# Patient Record
Sex: Female | Born: 1970
Health system: Southern US, Community
[De-identification: ages and names within clinical notes are randomized; demographics above are authoritative.]

## PROBLEM LIST (undated history)

## (undated) DIAGNOSIS — K219 Gastro-esophageal reflux disease without esophagitis: Secondary | ICD-10-CM

---

## 2005-07-29 ENCOUNTER — Ambulatory Visit: Payer: Self-pay | Admitting: Internal Medicine

## 2009-02-23 ENCOUNTER — Ambulatory Visit: Payer: Self-pay | Admitting: Internal Medicine

## 2011-03-25 ENCOUNTER — Ambulatory Visit: Payer: Self-pay | Admitting: Internal Medicine

## 2011-04-05 ENCOUNTER — Ambulatory Visit: Payer: Self-pay | Admitting: Internal Medicine

## 2011-05-22 ENCOUNTER — Ambulatory Visit: Payer: Self-pay | Admitting: Internal Medicine

## 2011-06-06 ENCOUNTER — Ambulatory Visit: Payer: Self-pay | Admitting: Internal Medicine

## 2011-07-04 ENCOUNTER — Ambulatory Visit: Payer: Self-pay | Admitting: Internal Medicine

## 2011-08-04 ENCOUNTER — Ambulatory Visit: Payer: Self-pay | Admitting: Internal Medicine

## 2012-03-10 ENCOUNTER — Other Ambulatory Visit (HOSPITAL_COMMUNITY)
Admission: RE | Admit: 2012-03-10 | Discharge: 2012-03-10 | Disposition: A | Discharge status: Home / Self Care | Payer: BC Managed Care – PPO | Source: Ambulatory Visit | Attending: Internal Medicine | Admitting: Internal Medicine

## 2012-03-10 ENCOUNTER — Ambulatory Visit (INDEPENDENT_AMBULATORY_CARE_PROVIDER_SITE_OTHER): Payer: BC Managed Care – PPO | Admitting: Internal Medicine

## 2012-03-10 ENCOUNTER — Encounter: Payer: Self-pay | Admitting: Internal Medicine

## 2012-03-10 VITALS — BP 122/78 | HR 105 | Temp 97.0°F | Resp 20 | Ht 67.0 in | Wt 286.0 lb

## 2012-03-10 DIAGNOSIS — R5383 Other fatigue: Secondary | ICD-10-CM

## 2012-03-10 DIAGNOSIS — R5381 Other malaise: Secondary | ICD-10-CM

## 2012-03-10 DIAGNOSIS — Z139 Encounter for screening, unspecified: Secondary | ICD-10-CM

## 2012-03-10 DIAGNOSIS — Z1151 Encounter for screening for human papillomavirus (HPV): Secondary | ICD-10-CM | POA: Insufficient documentation

## 2012-03-10 DIAGNOSIS — Z01419 Encounter for gynecological examination (general) (routine) without abnormal findings: Secondary | ICD-10-CM | POA: Insufficient documentation

## 2012-03-10 LAB — CBC WITH DIFFERENTIAL/PLATELET
Basophils Relative: 0.4 % (ref 0.0–3.0)
Eosinophils Absolute: 0.1 10*3/uL (ref 0.0–0.7)
Hemoglobin: 14 g/dL (ref 12.0–15.0)
Lymphocytes Relative: 26.8 % (ref 12.0–46.0)
MCHC: 32.9 g/dL (ref 30.0–36.0)
Monocytes Relative: 6.6 % (ref 3.0–12.0)
Neutro Abs: 5.3 10*3/uL (ref 1.4–7.7)
Neutrophils Relative %: 64.6 % (ref 43.0–77.0)
RBC: 4.93 Mil/uL (ref 3.87–5.11)
WBC: 8.2 10*3/uL (ref 4.5–10.5)

## 2012-03-10 LAB — LIPID PANEL
HDL: 32.4 mg/dL — ABNORMAL LOW (ref >39.00)
Triglycerides: 169 mg/dL — ABNORMAL HIGH (ref 0.0–149.0)
VLDL: 33.8 mg/dL (ref 0.0–40.0)

## 2012-03-10 LAB — COMPREHENSIVE METABOLIC PANEL
ALT: 28 U/L (ref 0–35)
AST: 23 U/L (ref 0–37)
Albumin: 4 g/dL (ref 3.5–5.2)
BUN: 14 mg/dL (ref 6–23)
CO2: 22 mEq/L (ref 19–32)
Calcium: 9.2 mg/dL (ref 8.4–10.5)
Chloride: 105 mEq/L (ref 96–112)
Creatinine, Ser: 0.8 mg/dL (ref 0.4–1.2)
GFR: 80.71 mL/min (ref >60.00)
Potassium: 4 mEq/L (ref 3.5–5.1)

## 2012-03-10 NOTE — Progress Notes (Signed)
  Subjective:    Patient ID: Gwendolyn Torres, female    DOB: [birth year], 41 y.o.   MRN: [id number]  HPI 41 year old female who comes in today for her complete physical exam.  She reports some increased stress.  Has noticed increased mood swings and is more emotional.  Still having regular periods.  Some increased work stress.  Has been a little heavier.  LMP 10/27.  Husband has had a vasectomy.  Discussed the increased stress and mood swings in more detail - with her.  She desires no further intervention at this time.  May see a counselor.  No chest pain or tightness.  No increased sob.     Review of Systems Patient denies any headache, lightheadedness or dizziness.  No sinus or allergy symptoms.  No chest pain, tightness or palpitations.  No increased shortness of breath, cough or congestion.  No nausea or vomiting.  No abdominal pain or cramping.  No bowel change, such as diarrhea, constipation, BRBPR or melana.  No urine change.        Objective:   Physical Exam Filed Vitals:   03/10/12 0825  BP: 122/78  Pulse: 105  Temp: 97 F (36.1 C)  Resp: 69   41 year old female in no acute distress.   HEENT:  Nares- clear.  Oropharynx - without lesions. NECK:  Supple.  Nontender.  No audible bruit.  HEART:  Appears to be regular. LUNGS:  No crackles or wheezing audible.  Respirations even and unlabored.  RADIAL PULSE:  Equal bilaterally.    BREASTS:  No nipple discharge or nipple retraction present.  Could not appreciate any distinct nodules or axillary adenopathy.  ABDOMEN:  Soft, nontender.  Bowel sounds present and normal.  No audible abdominal bruit.  GU:  Normal external genitalia.  Vaginal vault without lesions.  Cervix identified.  Pap performed. Could not appreciate any adnexal masses or tenderness.   EXTREMITIES:  No increased edema present.  DP pulses palpable and equal bilaterally.           Assessment & Plan:  FAMILY HISTORY OF ANEURYSMS.  Have discussed with her regarding screening  ultrasound.  Will notify me when agreeable.    FATIGUE.  May be multifactorial.  Increased stress.  Will check cbc, met c and tsh.    INCREASED PSYCHOSOCIAL STRESSORS.  Desires no further intervention at this point.  Discussed medication and counseling.  Will follow closely.  She will notify me if she feels she needs anything more.  I spent over 25 minutes with this pt and more than 50% of the time was spent in counseling.    HEALTH MAINTENANCE.  Physical today.  Schedule routine labs and mammograms.  Check cholesterol.    HEALTH MAINTENANCE.  Physical today.  Mammogram 02/23/09 - BiRADS II.  Check routine labs and cholesterol.

## 2012-03-10 NOTE — Patient Instructions (Addendum)
It was nice seeing you today.  Let me know if there is anything I can do or if you need anything.  We will notify you of your lab results once they are available.

## 2012-03-12 ENCOUNTER — Telehealth: Payer: Self-pay | Admitting: Internal Medicine

## 2012-03-12 NOTE — Telephone Encounter (Signed)
Notified pt of lab results through My Chart

## 2012-03-15 NOTE — Progress Notes (Signed)
Called patient with results.  

## 2012-03-24 ENCOUNTER — Ambulatory Visit: Payer: Self-pay | Admitting: Internal Medicine

## 2012-03-27 ENCOUNTER — Telehealth: Payer: Self-pay | Admitting: Internal Medicine

## 2012-03-27 ENCOUNTER — Encounter: Payer: Self-pay | Admitting: Internal Medicine

## 2012-03-27 NOTE — Telephone Encounter (Signed)
Pt notified of normal mammo via My Chart

## 2012-04-05 ENCOUNTER — Encounter: Payer: Self-pay | Admitting: Internal Medicine

## 2012-04-10 ENCOUNTER — Encounter: Payer: Self-pay | Admitting: Internal Medicine

## 2012-04-12 ENCOUNTER — Telehealth: Payer: Self-pay | Admitting: Internal Medicine

## 2012-04-12 NOTE — Telephone Encounter (Signed)
I reviewed Gwendolyn Torres's my chart message. I would like for her to schedule an appt to discuss starting the Wellbutrin.   Please schedule her an appt.  Let me know if is a problem.  Thanks.

## 2012-04-13 NOTE — Telephone Encounter (Signed)
Left message for pt to call and schedule appt 

## 2012-04-16 ENCOUNTER — Encounter: Payer: Self-pay | Admitting: Internal Medicine

## 2012-04-16 ENCOUNTER — Ambulatory Visit (INDEPENDENT_AMBULATORY_CARE_PROVIDER_SITE_OTHER): Payer: BC Managed Care – PPO | Admitting: Internal Medicine

## 2012-04-16 VITALS — BP 130/80 | HR 83 | Temp 98.3°F | Ht 67.0 in | Wt 289.5 lb

## 2012-04-16 DIAGNOSIS — Z566 Other physical and mental strain related to work: Secondary | ICD-10-CM

## 2012-04-16 DIAGNOSIS — R5383 Other fatigue: Secondary | ICD-10-CM

## 2012-04-16 DIAGNOSIS — R5381 Other malaise: Secondary | ICD-10-CM

## 2012-04-16 MED ORDER — BUPROPION HCL ER (XL) 150 MG PO TB24: 150.0000 mg | ORAL_TABLET | Freq: Every day | ORAL | Status: DC

## 2012-04-18 ENCOUNTER — Encounter: Payer: Self-pay | Admitting: Internal Medicine

## 2012-04-18 NOTE — Progress Notes (Signed)
  Subjective:    Patient ID: Gwendolyn Torres, female    DOB: 1970-09-16, 41 y.o.   MRN: [id number]  HPI 41 year old female who comes in today with concerns regarding increased stress.  More emotional.  Seeing a counselor Barbaraann Cao).  Increased stress with work.  Sleeping ok.  Eating and drinking well.  No bowel issues.  Desires to start medication.  No suicidal ideations.   Review of Systems Patient denies any headache, lightheadedness or dizziness.  No significant sinus or allergy symptoms.  No chest pain, tightness or palpitations.  No increased shortness of breath, cough or congestion.  No nausea or vomiting.  No abdominal pain or cramping.  No bowel change, such as diarrhea, constipation, BRBPR or melana.  No urine change.        Objective:   Physical Exam Filed Vitals:   04/16/12 0912  BP: 130/80  Pulse: 83  Temp: 98.3 F (52.39 C)   41 year old female in no acute distress.  NECK:  Supple, nontender.   HEART:  Appears to be regular. LUNGS:  Without crackles or wheezing audible.  Respirations even and unlabored.   RADIAL PULSE:  Equal bilaterally.  ABDOMEN:  Soft, nontender.                  Assessment & Plan:  INCREASED PSYCHOSOCIAL STRESSORS.  Discussed at length with her today.  No suicidal ideations.  Seeing Dr Oscar La.  Will start Wellbutrin XL 150mg  q day.  Follow closely.  Notify me if problems.  Get her back in soon to reassess.    HEALTH MAINTENANCE.  Physical  03/10/12.  Pap at physical wnl.  Mammogram 03/24/12 - Birads II.

## 2012-06-07 ENCOUNTER — Telehealth: Payer: Self-pay | Admitting: Internal Medicine

## 2012-06-07 NOTE — Telephone Encounter (Signed)
I am going to have to have some help with this.  I have no idea why the insurance did not cover - unless it has something to do with her particular insurance plan.  I have not had an issue with this previously.  Help! Thanks.

## 2012-06-07 NOTE — Telephone Encounter (Signed)
Patient called in today to discuss her bill from Dec. 13, 13 she states [employer] won't pay for the dx code that was billed that day. Is there another dx code we can add to get this bill changed. I have also sent Anabell a message asking her to look into the bill for me.  She also states she is suppose to come in on Wednesday she thinks this week to discuss how the new medication is doing that she was put on in Dec.; however she can't afford another $120. Bill if the dx code isn't going to be covered.

## 2012-06-09 ENCOUNTER — Encounter: Payer: Self-pay | Admitting: Internal Medicine

## 2012-06-09 DIAGNOSIS — M1710 Unilateral primary osteoarthritis, unspecified knee: Secondary | ICD-10-CM

## 2012-06-09 DIAGNOSIS — M542 Cervicalgia: Secondary | ICD-10-CM

## 2012-06-09 DIAGNOSIS — IMO0002 Reserved for concepts with insufficient information to code with codable children: Secondary | ICD-10-CM | POA: Insufficient documentation

## 2012-06-09 DIAGNOSIS — F419 Anxiety disorder, unspecified: Secondary | ICD-10-CM | POA: Insufficient documentation

## 2012-06-09 DIAGNOSIS — F418 Other specified anxiety disorders: Secondary | ICD-10-CM | POA: Insufficient documentation

## 2012-06-09 NOTE — Telephone Encounter (Signed)
I spoke with Dr. Lorin Picket, and we are going to send a request to charge correction to change dx code to 780.79. I have left message for patient to return my call.

## 2012-06-09 NOTE — Telephone Encounter (Signed)
I have also sent e-mail to Annabell to see if she can change dx code.  I have also advised patient to call insurance company to see if we are in network.

## 2012-06-10 ENCOUNTER — Ambulatory Visit (INDEPENDENT_AMBULATORY_CARE_PROVIDER_SITE_OTHER): Payer: BC Managed Care – PPO | Admitting: Internal Medicine

## 2012-06-10 ENCOUNTER — Encounter: Payer: Self-pay | Admitting: Internal Medicine

## 2012-06-10 VITALS — BP 120/74 | HR 97 | Temp 98.8°F | Ht 67.0 in | Wt 289.5 lb

## 2012-06-10 DIAGNOSIS — F411 Generalized anxiety disorder: Secondary | ICD-10-CM

## 2012-06-10 DIAGNOSIS — F418 Other specified anxiety disorders: Secondary | ICD-10-CM

## 2012-06-10 MED ORDER — CLOTRIMAZOLE-BETAMETHASONE 1-0.05 % EX CREA: TOPICAL_CREAM | Freq: Two times a day (BID) | CUTANEOUS | Status: DC

## 2012-06-11 ENCOUNTER — Encounter: Payer: Self-pay | Admitting: Internal Medicine

## 2012-06-11 ENCOUNTER — Telehealth: Payer: Self-pay | Admitting: Internal Medicine

## 2012-06-11 NOTE — Assessment & Plan Note (Signed)
Started on wellbutrin last visit.  See above.  Doing better.  Follow.

## 2012-06-11 NOTE — Telephone Encounter (Signed)
My chart message sent to pt.

## 2012-06-11 NOTE — Progress Notes (Signed)
  Subjective:    Patient ID: Gwendolyn Torres, female    DOB: [birth year], 42 y.o.   MRN: [id number]  HPI 42 year old female who comes in today for a scheduled follow up.  Was having increased stress.  See last note for details.  Was started on Wellbutrin.  Feels better.  Tolerating.  Feels more calm.  Has started exercising.  Not as emotional.  Overall feels much better.  Feels this dose is working well. For her.    Review of Systems Patient denies any headache, lightheadedness or dizziness.  No significant sinus or allergy symptoms.  No chest pain, tightness or palpitations.  No increased shortness of breath, cough or congestion.  No nausea or vomiting.  No abdominal pain or cramping.  No bowel change, such as diarrhea, constipation, BRBPR or melana.  No urine change.  She does report noticing a few skin lesions.  One on her arm.  One on her leg and one on her abdomen.  No itching.  Circular.  No new contacts or exposures.        Objective:   Physical Exam  Filed Vitals:   06/10/12 0908  BP: 120/74  Pulse: 97  Temp: 98.8 F (37.28 C)   42 year old female in no acute distress.  NECK:  Supple, nontender.   HEART:  Appears to be regular. LUNGS:  Without crackles or wheezing audible.  Respirations even and unlabored.   RADIAL PULSE:  Equal bilaterally.  ABDOMEN:  Soft, nontender.              EXTREMITIES:  No increased edema.   SKIN:  Small circular (drying) lesion left forearm.  Small circular lesions - one on her left lower leg and one on her abdomen.  No increased erythema or warmth.     Assessment & Plan:  INCREASED PSYCHOSOCIAL STRESSORS.  Started on Wellbutrin XL 150mg  q day last visit.  Doing better.  Follow closely.  Notify me if problems.   DERMATOLOGY.  Skin lesions as outlined.  Unclear as to the exact etiology.  Will treat with lotrisone cream as directed.  Notify me if persistent.    HEALTH MAINTENANCE.  Physical  03/10/12.  Pap at physical wnl.  Mammogram 03/24/12 - Birads II.

## 2012-06-19 ENCOUNTER — Other Ambulatory Visit: Payer: Self-pay

## 2012-06-20 ENCOUNTER — Telehealth: Payer: Self-pay | Admitting: Internal Medicine

## 2012-06-20 MED ORDER — BUPROPION HCL ER (XL) 150 MG PO TB24: 150.0000 mg | ORAL_TABLET | Freq: Every day | ORAL | Status: DC

## 2012-06-20 NOTE — Telephone Encounter (Signed)
Refilled wellbutrin XL 150mg  q day #90 with one refill

## 2012-10-15 ENCOUNTER — Encounter: Payer: Self-pay | Admitting: Internal Medicine

## 2012-10-15 ENCOUNTER — Ambulatory Visit (INDEPENDENT_AMBULATORY_CARE_PROVIDER_SITE_OTHER): Payer: BC Managed Care – PPO | Admitting: Internal Medicine

## 2012-10-15 VITALS — BP 120/70 | HR 76 | Temp 98.3°F | Ht 67.0 in | Wt 252.8 lb

## 2012-10-15 DIAGNOSIS — F418 Other specified anxiety disorders: Secondary | ICD-10-CM

## 2012-10-15 DIAGNOSIS — F411 Generalized anxiety disorder: Secondary | ICD-10-CM

## 2012-10-17 ENCOUNTER — Encounter: Payer: Self-pay | Admitting: Internal Medicine

## 2012-10-17 NOTE — Assessment & Plan Note (Signed)
On Wellbutrin.  Doing well.  Follow.

## 2012-10-17 NOTE — Progress Notes (Signed)
  Subjective:    Patient ID: Gwendolyn Torres, female    DOB: [birth year], 42 y.o.   MRN: [id number]  HPI 42 year old female who comes in today for a scheduled follow up.  Was having increased stress.  See previous notes for details.  Was started on Wellbutrin.  Feels better.  Tolerating.  Feels more calm.  Has been exercising.  Joined [employer].  Exercises regularly.   Overall feels much better.  Feels this dose is working well for her.  Has lost weight.     Current Outpatient Prescriptions on File Prior to Visit  Medication Sig Dispense Refill  . buPROPion (WELLBUTRIN XL) 150 MG 24 hr tablet Take 1 tablet (150 mg total) by mouth daily.  90 tablet  1  . ibuprofen (ADVIL,MOTRIN) 200 MG tablet Take 200 mg by mouth.      . clotrimazole-betamethasone (LOTRISONE) cream Apply topically 2 (two) times daily. For 7-10 days.  30 g  0   No current facility-administered medications on file prior to visit.    Review of Systems Patient denies any headache, lightheadedness or dizziness.  No significant sinus or allergy symptoms.  No chest pain, tightness or palpitations.  No increased shortness of breath, cough or congestion.  No nausea or vomiting.  No abdominal pain or cramping.  No bowel change, such as diarrhea, constipation, BRBPR or melana.  No urine change.  Exercising.  Watching what she eats.  Has lost weight.  Feels better.         Objective:   Physical Exam  Filed Vitals:   10/15/12 0802  BP: 120/70  Pulse: 76  Temp: 98.3 F (50.63 C)   42 year old female in no acute distress. HEENT:  Nares clear.  Oropharynx - without lesions.   NECK:  Supple, nontender.   HEART:  Appears to be regular. LUNGS:  Without crackles or wheezing audible.  Respirations even and unlabored.   RADIAL PULSE:  Equal bilaterally.  ABDOMEN:  Soft, nontender.             EXTREMITIES:  No increased edema.      Assessment & Plan:  INCREASED PSYCHOSOCIAL STRESSORS.  Doing well on Wellbutrin XL 150mg  q day.   Follow.  WEIGHT LOSS.  Has joined [employer] and is doing [employer].  Has lost weight.  Feels better.  Follow.    HEALTH MAINTENANCE.  Physical  03/10/12.  Pap at physical wnl.  Mammogram 03/24/12 - [facility]ads II.

## 2012-12-22 ENCOUNTER — Other Ambulatory Visit: Payer: Self-pay | Admitting: *Deleted

## 2012-12-22 MED ORDER — BUPROPION HCL ER (XL) 150 MG PO TB24: 150.0000 mg | ORAL_TABLET | Freq: Every day | ORAL | Status: DC

## 2013-03-10 ENCOUNTER — Other Ambulatory Visit: Payer: Self-pay

## 2013-03-14 ENCOUNTER — Encounter: Payer: Self-pay | Admitting: Internal Medicine

## 2013-03-14 ENCOUNTER — Other Ambulatory Visit: Payer: Self-pay | Admitting: *Deleted

## 2013-03-14 ENCOUNTER — Ambulatory Visit (INDEPENDENT_AMBULATORY_CARE_PROVIDER_SITE_OTHER): Payer: BC Managed Care – PPO | Admitting: Internal Medicine

## 2013-03-14 VITALS — BP 110/80 | HR 74 | Temp 98.3°F | Ht 67.0 in | Wt 233.0 lb

## 2013-03-14 DIAGNOSIS — F439 Reaction to severe stress, unspecified: Secondary | ICD-10-CM

## 2013-03-14 DIAGNOSIS — F411 Generalized anxiety disorder: Secondary | ICD-10-CM

## 2013-03-14 DIAGNOSIS — Z733 Stress, not elsewhere classified: Secondary | ICD-10-CM

## 2013-03-14 DIAGNOSIS — Z13220 Encounter for screening for lipoid disorders: Secondary | ICD-10-CM

## 2013-03-14 DIAGNOSIS — F418 Other specified anxiety disorders: Secondary | ICD-10-CM

## 2013-03-14 LAB — CBC WITH DIFFERENTIAL/PLATELET
Basophils Absolute: 0 10*3/uL (ref 0.0–0.1)
Eosinophils Absolute: 0.2 10*3/uL (ref 0.0–0.7)
Eosinophils Relative: 2.4 % (ref 0.0–5.0)
MCHC: 34.1 g/dL (ref 30.0–36.0)
MCV: 86.6 fl (ref 78.0–100.0)
Monocytes Absolute: 0.9 10*3/uL (ref 0.1–1.0)
Neutrophils Relative %: 56 % (ref 43.0–77.0)
Platelets: 300 10*3/uL (ref 150.0–400.0)
WBC: 7.1 10*3/uL (ref 4.5–10.5)

## 2013-03-14 LAB — LIPID PANEL
Cholesterol: 128 mg/dL (ref 0–200)
HDL: 46.4 mg/dL (ref >39.00)
LDL Cholesterol: 70 mg/dL (ref 0–99)
Total CHOL/HDL Ratio: 3
Triglycerides: 57 mg/dL (ref 0.0–149.0)

## 2013-03-14 LAB — COMPREHENSIVE METABOLIC PANEL
ALT: 23 U/L (ref 0–35)
AST: 20 U/L (ref 0–37)
Albumin: 3.9 g/dL (ref 3.5–5.2)
Alkaline Phosphatase: 60 U/L (ref 39–117)
Potassium: 4.8 mEq/L (ref 3.5–5.1)
Sodium: 138 mEq/L (ref 135–145)
Total Protein: 7.5 g/dL (ref 6.0–8.3)

## 2013-03-14 MED ORDER — BUPROPION HCL ER (XL) 300 MG PO TB24: 300.0000 mg | ORAL_TABLET | Freq: Every day | ORAL | Status: DC

## 2013-03-14 MED ORDER — FLUTICASONE PROPIONATE 50 MCG/ACT NA SUSP: 2.0000 | Freq: Every day | NASAL | Status: DC

## 2013-03-14 NOTE — Progress Notes (Signed)
  Subjective:    Patient ID: Gwendolyn Torres, female    DOB: Dec 27, 1970, 42 y.o.   MRN: [id number]  HPI 42 year old female who comes in today for her complete physical exam.   Was having increased stress.  See previous notes for details.  Was started on Wellbutrin.   Feels better.  Tolerating.  Feels more calm.  Has been exercising.  Joined [employer].  Exercises regularly.   Overall feels much better.   Has lost weight.  Feels the wellbutrin has been working well for her, but has noticed some increased stress recently.  Feels may need to increase the dose.   Periods regular.  LMP 02/21/13.     Current Outpatient Prescriptions on File Prior to Visit  Medication Sig Dispense Refill  . ibuprofen (ADVIL,MOTRIN) 200 MG tablet Take 200 mg by mouth as needed for headache.        No current facility-administered medications on file prior to visit.    Review of Systems Patient denies any headache, lightheadedness or dizziness.  No significant sinus or allergy symptoms.  No chest pain, tightness or palpitations.  No increased shortness of breath, cough or congestion.  No nausea or vomiting.  No abdominal pain or cramping.  No bowel change, such as diarrhea, constipation, BRBPR or melana.  No urine change.  Exercising.  Watching what she eats.  Has lost weight.  Feels better.  Increased stress as outlined.  See above.        Objective:   Physical Exam  Filed Vitals:   03/14/13 0823  BP: 110/80  Pulse: 74  Temp: 98.3 F (36.8 C)   Blood pressure recheck:  112/78, pulse 10  42 year old female in no acute distress.   HEENT:  Nares- clear.  Oropharynx - without lesions. NECK:  Supple.  Nontender.  No audible bruit.  HEART:  Appears to be regular. LUNGS:  No crackles or wheezing audible.  Respirations even and unlabored.  RADIAL PULSE:  Equal bilaterally.    BREASTS:  No nipple discharge or nipple retraction present.  Could not appreciate any distinct nodules or axillary adenopathy.  ABDOMEN:   Soft, nontender.  Bowel sounds present and normal.  No audible abdominal bruit.  GU:  Not performed.     EXTREMITIES:  No increased edema present.  DP pulses palpable and equal bilaterally.          Assessment & Plan:  WEIGHT LOSS.  Has joined [employer] and is doing [employer].  Has lost weight.  Feels better.  Follow.    HEALTH MAINTENANCE.  Physical  today.  Pap at physical 11/13 - negative with negative HPV.   Mammogram 03/24/12 - Birads II.  Gave her the information to schedule a f/u mammogram.    I spent 25 minutes with the patient and more than 50% of the time was spent in consultation regarding the above.

## 2013-03-14 NOTE — Assessment & Plan Note (Signed)
On Wellbutrin.  Increased stress.  Feels may need to increase the dose.  Will increase wellbutrin to 300mg  q day.  Follow.

## 2013-03-14 NOTE — Progress Notes (Signed)
Pre-visit discussion using our clinic review tool. No additional management support is needed unless otherwise documented below in the visit note.  

## 2013-03-15 ENCOUNTER — Encounter: Payer: Self-pay | Admitting: Internal Medicine

## 2013-05-03 ENCOUNTER — Ambulatory Visit: Payer: Self-pay | Admitting: Internal Medicine

## 2013-05-03 LAB — HM MAMMOGRAPHY: HM MAMMO: NEGATIVE

## 2013-05-06 ENCOUNTER — Encounter: Payer: Self-pay | Admitting: Internal Medicine

## 2013-06-14 ENCOUNTER — Encounter: Payer: Self-pay | Admitting: Internal Medicine

## 2013-06-14 ENCOUNTER — Ambulatory Visit (INDEPENDENT_AMBULATORY_CARE_PROVIDER_SITE_OTHER): Payer: BC Managed Care – PPO | Admitting: Internal Medicine

## 2013-06-14 VITALS — BP 110/70 | HR 81 | Temp 98.6°F | Ht 67.0 in | Wt 227.0 lb

## 2013-06-14 DIAGNOSIS — F411 Generalized anxiety disorder: Secondary | ICD-10-CM

## 2013-06-14 DIAGNOSIS — F418 Other specified anxiety disorders: Secondary | ICD-10-CM

## 2013-06-14 NOTE — Progress Notes (Signed)
Pre-visit discussion using our clinic review tool. No additional management support is needed unless otherwise documented below in the visit note.  

## 2013-06-19 ENCOUNTER — Encounter: Payer: Self-pay | Admitting: Internal Medicine

## 2013-06-19 NOTE — Assessment & Plan Note (Signed)
Now on wellbutrin 300mg  q day.  Doing well.  Follow.

## 2013-06-19 NOTE — Progress Notes (Signed)
  Subjective:    Patient ID: Gwendolyn Torres, female    DOB: [birth year], 43 y.o.   MRN: [id number]  HPI 43 year old female who comes in today for a scheduled follow up.  Was having increased stress.  See previous notes for details.  Was started on Wellbutrin.   Feels better.  Tolerating.  Feels more calm.  Has been exercising.  [place].  Exercises regularly.   Overall feels much better.   Has lost weight.  Feels the wellbutrin has been working well for her.  We increased the dose last visit.  Doing better.      Current Outpatient Prescriptions on File Prior to Visit  Medication Sig Dispense Refill  . buPROPion (WELLBUTRIN XL) 300 MG 24 hr tablet Take 1 tablet (300 mg total) by mouth daily.  30 tablet  5  . ibuprofen (ADVIL,MOTRIN) 200 MG tablet Take 200 mg by mouth as needed for headache.        No current facility-administered medications on file prior to visit.    Review of Systems Patient denies any headache, lightheadedness or dizziness.  No significant sinus or allergy symptoms.  No chest pain, tightness or palpitations.  No increased shortness of breath, cough or congestion.  No nausea or vomiting.  No abdominal pain or cramping.  No bowel change, such as diarrhea, constipation, BRBPR or melana.  No urine change.  Exercising.  Watching what she eats.  Has lost weight.  Feels better.  Feels she is handling stress well.         Objective:   Physical Exam  Filed Vitals:   06/14/13 0946  BP: 110/70  Pulse: 81  Temp: 98.6 F (37 C)   Blood pressure recheck:  50/88  43 year old female in no acute distress.   HEENT:  Nares- clear.  Oropharynx - without lesions. NECK:  Supple.  Nontender.  No audible bruit.  HEART:  Appears to be regular. LUNGS:  No crackles or wheezing audible.  Respirations even and unlabored.  RADIAL PULSE:  Equal bilaterally.   ABDOMEN:  Soft, nontender.  Bowel sounds present and normal.  No audible abdominal bruit.     EXTREMITIES:  No increased  edema present.  DP pulses palpable and equal bilaterally.          Assessment & Plan:  WEIGHT LOSS.  Has joined [employer] and is doing [employer].  Has lost weight.  Feels better.  Follow.    HEALTH MAINTENANCE.  Physical  03/14/13.  Pap at physical 11/13 - negative with negative HPV.   Mammogram 05/03/13 - Birads I.

## 2013-09-10 ENCOUNTER — Other Ambulatory Visit: Payer: Self-pay | Admitting: Internal Medicine

## 2013-12-30 ENCOUNTER — Other Ambulatory Visit: Payer: Self-pay | Admitting: Internal Medicine

## 2013-12-30 NOTE — Telephone Encounter (Signed)
Last refill 7.21.15, last OV 2.10.15.  Please advise refill

## 2013-12-30 NOTE — Telephone Encounter (Signed)
Refilled wellbutrin #30 with 2 refills.

## 2014-03-16 ENCOUNTER — Telehealth: Payer: Self-pay | Admitting: Internal Medicine

## 2014-03-16 ENCOUNTER — Encounter: Payer: Self-pay | Admitting: Internal Medicine

## 2014-03-16 ENCOUNTER — Ambulatory Visit (INDEPENDENT_AMBULATORY_CARE_PROVIDER_SITE_OTHER): Payer: BC Managed Care – PPO | Admitting: Internal Medicine

## 2014-03-16 VITALS — BP 116/77 | HR 67 | Temp 98.4°F | Ht 66.5 in | Wt 211.5 lb

## 2014-03-16 DIAGNOSIS — Z1239 Encounter for other screening for malignant neoplasm of breast: Secondary | ICD-10-CM

## 2014-03-16 DIAGNOSIS — E669 Obesity, unspecified: Secondary | ICD-10-CM

## 2014-03-16 DIAGNOSIS — F418 Other specified anxiety disorders: Secondary | ICD-10-CM

## 2014-03-16 DIAGNOSIS — Z13220 Encounter for screening for lipoid disorders: Secondary | ICD-10-CM

## 2014-03-16 DIAGNOSIS — E875 Hyperkalemia: Secondary | ICD-10-CM

## 2014-03-16 DIAGNOSIS — R5383 Other fatigue: Secondary | ICD-10-CM

## 2014-03-16 LAB — CBC WITH DIFFERENTIAL/PLATELET
Basophils Absolute: 0 10*3/uL (ref 0.0–0.1)
Basophils Relative: 0.6 % (ref 0.0–3.0)
Eosinophils Absolute: 0.1 10*3/uL (ref 0.0–0.7)
Eosinophils Relative: 2.1 % (ref 0.0–5.0)
HCT: 39.7 % (ref 36.0–46.0)
Hemoglobin: 13 g/dL (ref 12.0–15.0)
Lymphocytes Relative: 35.6 % (ref 12.0–46.0)
Lymphs Abs: 2.2 10*3/uL (ref 0.7–4.0)
MCHC: 32.8 g/dL (ref 30.0–36.0)
MCV: 89.4 fl (ref 78.0–100.0)
MONOS PCT: 7.7 % (ref 3.0–12.0)
Monocytes Absolute: 0.5 10*3/uL (ref 0.1–1.0)
Neutro Abs: 3.3 10*3/uL (ref 1.4–7.7)
Neutrophils Relative %: 54 % (ref 43.0–77.0)
PLATELETS: 292 10*3/uL (ref 150.0–400.0)
RBC: 4.45 Mil/uL (ref 3.87–5.11)
RDW: 12.8 % (ref 11.5–15.5)
WBC: 6.1 10*3/uL (ref 4.0–10.5)

## 2014-03-16 LAB — LIPID PANEL
Cholesterol: 141 mg/dL (ref 0–200)
HDL: 48.9 mg/dL (ref >39.00)
LDL Cholesterol: 84 mg/dL (ref 0–99)
NonHDL: 92.1
TRIGLYCERIDES: 43 mg/dL (ref 0.0–149.0)
Total CHOL/HDL Ratio: 3
VLDL: 8.6 mg/dL (ref 0.0–40.0)

## 2014-03-16 LAB — COMPREHENSIVE METABOLIC PANEL
ALT: 19 U/L (ref 0–35)
AST: 19 U/L (ref 0–37)
Albumin: 3.3 g/dL — ABNORMAL LOW (ref 3.5–5.2)
Alkaline Phosphatase: 62 U/L (ref 39–117)
BUN: 15 mg/dL (ref 6–23)
CALCIUM: 9.3 mg/dL (ref 8.4–10.5)
CO2: 22 meq/L (ref 19–32)
CREATININE: 0.8 mg/dL (ref 0.4–1.2)
Chloride: 107 mEq/L (ref 96–112)
GFR: 84.6 mL/min (ref >60.00)
Glucose, Bld: 90 mg/dL (ref 70–99)
Potassium: 5.1 mEq/L (ref 3.5–5.1)
Sodium: 139 mEq/L (ref 135–145)
Total Bilirubin: 0.4 mg/dL (ref 0.2–1.2)
Total Protein: 6.6 g/dL (ref 6.0–8.3)

## 2014-03-16 LAB — TSH: TSH: 1.67 u[IU]/mL (ref 0.35–4.50)

## 2014-03-16 NOTE — Progress Notes (Signed)
  Subjective:    Patient ID: Gwendolyn Torres, female    DOB: 1970-12-07, 43 y.o.   MRN: [id number]  HPI 43 year old female who comes in today for her physical exam.  See previous notes for details.  Was started on Wellbutrin.   Feels better.  Tolerating.  Feels more calm.  Has been exercising.  [place].  Exercises regularly.   Overall feels much better.   Has lost weight.  Feels the wellbutrin has been working well for her.  On 300mg  wellbutrin now and doing well.  Overall feels good.        Current Outpatient Prescriptions on File Prior to Visit  Medication Sig Dispense Refill  . buPROPion (WELLBUTRIN XL) 300 MG 24 hr tablet TAKE 1 TABLET BY MOUTH DAILY 30 tablet 2  . ibuprofen (ADVIL,MOTRIN) 200 MG tablet Take 200 mg by mouth as needed for headache.      No current facility-administered medications on file prior to visit.    Review of Systems Patient denies any headache, lightheadedness or dizziness.  No significant sinus or allergy symptoms.  No chest pain, tightness or palpitations.  No increased shortness of breath, cough or congestion.  No nausea or vomiting.  No abdominal pain or cramping.  No bowel change, such as diarrhea, constipation, BRBPR or melana.  No urine change.  Exercising.  Watching what she eats.  Has lost weight.  Feels better.  Feels she is handling stress well.         Objective:   Physical Exam  Filed Vitals:   03/16/14 0833  BP: 116/77  Pulse: 67  Temp: 98.4 F (36.9 C)   Blood pressure recheck:  61/51  43 year old female in no acute distress.   HEENT:  Nares- clear.  Oropharynx - without lesions. NECK:  Supple.  Nontender.  No audible bruit.  HEART:  Appears to be regular. LUNGS:  No crackles or wheezing audible.  Respirations even and unlabored.  RADIAL PULSE:  Equal bilaterally.    BREASTS:  No nipple discharge or nipple retraction present.  Could not appreciate any distinct nodules or axillary adenopathy.  ABDOMEN:  Soft, nontender.   Bowel sounds present and normal.  No audible abdominal bruit.  GU:  Not performed.    EXTREMITIES:  No increased edema present.  DP pulses palpable and equal bilaterally.         Assessment & Plan:  1. Situational anxiety On wellbutrin.  Doing well.  Follow.    2. Other fatigue Overall doing well.  Some fatigue and increased stress at work.  Handling well.  On wellbutrin.   - CBC with Differential; Future - Comprehensive metabolic panel; Future - TSH; Future  3. Screening cholesterol level Continue low cholesterol diet and exercise.  Follow.   - Lipid panel; Future  4. Obesity (BMI 30-39.9) Is doing well with her weight loss.  Is watching her diet and exercising.   WEIGHT LOSS.    Has lost weight.  Feels better.  Follow.    HEALTH MAINTENANCE.  Physical today.  Pap at physical 11/13 - negative with negative HPV.   Mammogram 05/03/13 - Birads I.   Schedule f/u mammogram.    I spent 25 minutes with the patient and more than 50% of the time was spent in consultation regarding the above.

## 2014-03-16 NOTE — Progress Notes (Signed)
Pre visit review using our clinic review tool, if applicable. No additional management support is needed unless otherwise documented below in the visit note. 

## 2014-03-16 NOTE — Telephone Encounter (Signed)
Pt notified of lab results via my chart.  Needs a non fastin lab appt in 2 weeks.  Please schedule and contact her with an appt date and time.  Thanks

## 2014-03-26 ENCOUNTER — Encounter: Payer: Self-pay | Admitting: Internal Medicine

## 2014-03-26 DIAGNOSIS — Z6841 Body Mass Index (BMI) 40.0 and over, adult: Secondary | ICD-10-CM | POA: Insufficient documentation

## 2014-03-27 ENCOUNTER — Other Ambulatory Visit: Payer: Self-pay | Admitting: Internal Medicine

## 2014-04-03 ENCOUNTER — Other Ambulatory Visit (INDEPENDENT_AMBULATORY_CARE_PROVIDER_SITE_OTHER): Payer: BC Managed Care – PPO

## 2014-04-03 ENCOUNTER — Encounter: Payer: Self-pay | Admitting: Internal Medicine

## 2014-04-03 DIAGNOSIS — E875 Hyperkalemia: Secondary | ICD-10-CM

## 2014-04-03 LAB — POTASSIUM: POTASSIUM: 4.5 meq/L (ref 3.5–5.1)

## 2014-06-26 ENCOUNTER — Ambulatory Visit: Payer: Self-pay | Admitting: Internal Medicine

## 2014-07-08 ENCOUNTER — Other Ambulatory Visit: Payer: Self-pay | Admitting: Internal Medicine

## 2014-09-14 ENCOUNTER — Ambulatory Visit: Payer: BC Managed Care – PPO | Admitting: Internal Medicine

## 2014-09-22 ENCOUNTER — Other Ambulatory Visit: Payer: Self-pay | Admitting: Internal Medicine

## 2014-09-22 NOTE — Telephone Encounter (Signed)
Last OV 11/15 OK to fill?

## 2014-11-18 ENCOUNTER — Other Ambulatory Visit: Payer: Self-pay | Admitting: Internal Medicine

## 2014-11-20 NOTE — Telephone Encounter (Signed)
Refilled wellbutrin #30 with no refills.

## 2014-11-20 NOTE — Telephone Encounter (Signed)
Pt scheduled appoint on 7.25.16 at 4 pm.  Please advise refill

## 2014-11-27 ENCOUNTER — Telehealth: Payer: Self-pay | Admitting: Internal Medicine

## 2014-11-27 ENCOUNTER — Ambulatory Visit: Payer: Self-pay | Admitting: Nurse Practitioner

## 2014-11-27 ENCOUNTER — Other Ambulatory Visit: Payer: Self-pay

## 2014-11-27 MED ORDER — BUPROPION HCL ER (XL) 300 MG PO TB24: ORAL_TABLET | ORAL | Status: DC

## 2014-11-27 NOTE — Telephone Encounter (Signed)
Pt needs refill on Wellbutrin/msn

## 2014-11-27 NOTE — Telephone Encounter (Signed)
Left message for patient that I refilled her medication.

## 2014-12-20 ENCOUNTER — Telehealth: Payer: Self-pay | Admitting: *Deleted

## 2014-12-20 NOTE — Telephone Encounter (Signed)
Patient will be having surgery on her foot, 03/15/15 . Patient was advised by [facility]  to be see if Dr. Nicki Reaper would like to see her before her surgery. If Dr. Nicki Reaper chooses to see patient, there is a form in her box that also needs to be filled out. -Thanks

## 2014-12-20 NOTE — Telephone Encounter (Signed)
I can see her on 01/30/15 at 4:00 (block 30 minutes ) for pre op evaluation.  Confirm ok to wait until this appt to be seen.

## 2014-12-21 ENCOUNTER — Other Ambulatory Visit: Payer: Self-pay | Admitting: Internal Medicine

## 2014-12-21 NOTE — Telephone Encounter (Signed)
Spoke with pt, advised of appoint time/date.  Pt verbalized understanding.  Given to [place] to schedule

## 2014-12-22 NOTE — Telephone Encounter (Signed)
ok'd refill wellbutrin #30 with  One refill.

## 2014-12-22 NOTE — Telephone Encounter (Signed)
OK to Fill?

## 2015-01-30 ENCOUNTER — Ambulatory Visit (INDEPENDENT_AMBULATORY_CARE_PROVIDER_SITE_OTHER): Payer: BLUE CROSS/BLUE SHIELD | Admitting: Internal Medicine

## 2015-01-30 ENCOUNTER — Encounter: Payer: Self-pay | Admitting: Internal Medicine

## 2015-01-30 VITALS — BP 118/80 | HR 66 | Temp 98.4°F | Ht 66.5 in | Wt 239.4 lb

## 2015-01-30 DIAGNOSIS — F418 Other specified anxiety disorders: Secondary | ICD-10-CM

## 2015-01-30 DIAGNOSIS — M79671 Pain in right foot: Secondary | ICD-10-CM | POA: Diagnosis not present

## 2015-01-30 DIAGNOSIS — E669 Obesity, unspecified: Secondary | ICD-10-CM | POA: Diagnosis not present

## 2015-01-30 DIAGNOSIS — Z01818 Encounter for other preprocedural examination: Secondary | ICD-10-CM | POA: Diagnosis not present

## 2015-01-30 NOTE — Progress Notes (Signed)
Patient ID: Gwendolyn Torres, female   DOB: May 15, 1970, 44 y.o.   MRN: [id number]   Subjective:    Patient ID: Gwendolyn Torres, female    DOB: January 05, 1971, 44 y.o.   MRN: [id number]  HPI  Patient comes in today for pre op evaluation.  She is seeing podiatry.  Planning for foot surgery 03/15/15.  She stays active.  No cardiac symptoms with increased activity or exertion.  No sob.  No acid relfux.  She has not been exercising.  Plans to get more serious about her diet and exercise.  No abdomina pain or cramping.  Bowels stable.    No past medical history on file. Past Surgical History  Procedure Laterality Date  . Cesarean section  2004   Family History  Problem Relation Age of Onset  . Aortic aneurysm Father     also has popliteal aneurysm  . Hypertension Father   . Hypercholesterolemia Father   . Diabetes Father   . Aortic aneurysm Mother   . Hypercholesterolemia Mother   . Hypercholesterolemia Sister     x2  . Hyperlipidemia Brother   . Hypertension Sister   . Breast cancer Maternal Aunt   . Melanoma Maternal Uncle   . Colon cancer Neg Hx    Social History   Social History  . Marital Status: Married    Spouse Name: N/A  . Number of Children: 1  . Years of Education: N/A   Occupational History  . transcriptionist    Social History Main Topics  . Smoking status: Never Smoker   . Smokeless tobacco: Never Used  . Alcohol Use: No  . Drug Use: No  . Sexual Activity: Not Asked   Other Topics Concern  . None   Social History Narrative    Outpatient Encounter Prescriptions as of 01/30/2015  Medication Sig  . buPROPion (WELLBUTRIN XL) 300 MG 24 hr tablet Take 1 tablet (300 mg total) by mouth daily.  Marland Kitchen ibuprofen (ADVIL,MOTRIN) 200 MG tablet Take 200 mg by mouth as needed for headache.   . [DISCONTINUED] buPROPion (WELLBUTRIN XL) 300 MG 24 hr tablet TAKE 1 TABLET BY MOUTH DAILY(MAKE DR APPT)   No facility-administered encounter medications on file as of 01/30/2015.    Review  of Systems  Constitutional: Negative for appetite change.       Has gained some weight back.  Discussed diet and exercise.    HENT: Negative for congestion and sinus pressure.   Eyes: Negative for pain and visual disturbance.  Respiratory: Negative for cough, chest tightness and shortness of breath.   Cardiovascular: Negative for chest pain, palpitations and leg swelling.  Gastrointestinal: Negative for nausea, vomiting, abdominal pain and diarrhea.  Genitourinary: Negative for dysuria and difficulty urinating.  Musculoskeletal: Positive for arthralgias. Negative for back pain and joint swelling.  Skin: Negative for color change and rash.  Neurological: Negative for dizziness, light-headedness and headaches.  Psychiatric/Behavioral: Negative for behavioral problems and agitation.       Objective:    Physical Exam  Constitutional: She appears well-developed and well-nourished. No distress.  HENT:  Nose: Nose normal.  Mouth/Throat: Oropharynx is clear and moist.  Eyes: Conjunctivae are normal. Left eye exhibits no discharge.  Neck: Neck supple. No thyromegaly present.  Cardiovascular: Normal rate and regular rhythm.   Pulmonary/Chest: Breath sounds normal. No respiratory distress. She has no wheezes.  Abdominal: Soft. Bowel sounds are normal. There is no tenderness.  Musculoskeletal: She exhibits no edema or tenderness.  Lymphadenopathy:  She has no cervical adenopathy.  Skin: Skin is dry. No rash noted. No erythema.    BP 118/80 mmHg  Pulse 66  Temp(Src) 98.4 F (36.9 C) (Oral)  Ht 5' 6.5" (1.689 m)  Wt 239 lb 6 oz (108.58 kg)  BMI 38.06 kg/m2  SpO2 97%  LMP 01/28/2015 (Exact Date) Wt Readings from Last 3 Encounters:  01/30/15 239 lb 6 oz (108.58 kg)  03/16/14 211 lb 8 oz (95.936 kg)  06/14/13 227 lb (102.967 kg)     Lab Results  Component Value Date   WBC 6.1 03/16/2014   HGB 13.0 03/16/2014   HCT 39.7 03/16/2014   PLT 292.0 03/16/2014   GLUCOSE 90 03/16/2014    CHOL 141 03/16/2014   TRIG 43.0 03/16/2014   HDL 48.90 03/16/2014   LDLCALC 84 03/16/2014   ALT 19 03/16/2014   AST 19 03/16/2014   NA 139 03/16/2014   K 4.5 04/03/2014   CL 107 03/16/2014   CREATININE 0.8 03/16/2014   BUN 15 03/16/2014   CO2 22 03/16/2014   TSH 1.67 03/16/2014       Assessment & Plan:   Problem List Items Addressed This Visit    Foot pain, right    Persistent issues with her foot.  Saw podiatry.  Planning for surgery as outlined.  Currently dong well.  I feel that she is a low risk from a cardiac standpoint to proceed with surgery.  I recommend close intra op and post op monitoring of her heart rated and blood pressure to avoid extremes.        Obesity (BMI 30-39.9)    Diet and exercise.  Follow.       Pre-op evaluation    Planning for foot surgery.  Seeing podiatry.  Form completed.  I do feel that she is at low risk from a cardiac standpoint to proceed with the planned surgery.  I do recommend close monitoring of heart rate and blood pressure to avoid extremes.        Situational anxiety    On wellbutrin.  Stable.         Other Visit Diagnoses    Preoperative clearance    -  Primary    Relevant Orders    EKG 12-Lead (Completed)        Einar Pheasant, MD

## 2015-01-30 NOTE — Progress Notes (Signed)
Pre-visit discussion using our clinic review tool. No additional management support is needed unless otherwise documented below in the visit note.  

## 2015-02-03 ENCOUNTER — Encounter: Payer: Self-pay | Admitting: Internal Medicine

## 2015-02-03 DIAGNOSIS — Z01818 Encounter for other preprocedural examination: Secondary | ICD-10-CM | POA: Insufficient documentation

## 2015-02-03 DIAGNOSIS — M79671 Pain in right foot: Secondary | ICD-10-CM | POA: Insufficient documentation

## 2015-02-03 NOTE — Assessment & Plan Note (Signed)
On wellbutrin.  Stable.  

## 2015-02-03 NOTE — Assessment & Plan Note (Signed)
Planning for foot surgery.  Seeing podiatry.  Form completed.  I do feel that she is at low risk from a cardiac standpoint to proceed with the planned surgery.  I do recommend close monitoring of heart rate and blood pressure to avoid extremes.

## 2015-02-03 NOTE — Assessment & Plan Note (Signed)
Diet and exercise.  Follow.  

## 2015-02-03 NOTE — Assessment & Plan Note (Signed)
Persistent issues with her foot.  Saw podiatry.  Planning for surgery as outlined.  Currently dong well.  I feel that she is a low risk from a cardiac standpoint to proceed with surgery.  I recommend close intra op and post op monitoring of her heart rated and blood pressure to avoid extremes.

## 2015-02-18 ENCOUNTER — Other Ambulatory Visit: Payer: Self-pay | Admitting: Internal Medicine

## 2015-03-19 ENCOUNTER — Encounter: Payer: Self-pay | Admitting: Internal Medicine

## 2015-03-26 ENCOUNTER — Encounter: Payer: Self-pay | Admitting: Internal Medicine

## 2015-04-21 ENCOUNTER — Other Ambulatory Visit: Payer: Self-pay | Admitting: Internal Medicine

## 2015-05-01 ENCOUNTER — Other Ambulatory Visit (HOSPITAL_COMMUNITY)
Admission: RE | Admit: 2015-05-01 | Discharge: 2015-05-01 | Disposition: A | Discharge status: Home / Self Care | Payer: 59 | Source: Ambulatory Visit | Attending: Internal Medicine | Admitting: Internal Medicine

## 2015-05-01 ENCOUNTER — Ambulatory Visit (INDEPENDENT_AMBULATORY_CARE_PROVIDER_SITE_OTHER): Payer: 59 | Admitting: Internal Medicine

## 2015-05-01 ENCOUNTER — Encounter: Payer: Self-pay | Admitting: Internal Medicine

## 2015-05-01 VITALS — BP 121/76 | HR 71 | Temp 98.3°F | Ht 66.25 in | Wt 241.4 lb

## 2015-05-01 DIAGNOSIS — E669 Obesity, unspecified: Secondary | ICD-10-CM

## 2015-05-01 DIAGNOSIS — L989 Disorder of the skin and subcutaneous tissue, unspecified: Secondary | ICD-10-CM | POA: Insufficient documentation

## 2015-05-01 DIAGNOSIS — Z124 Encounter for screening for malignant neoplasm of cervix: Secondary | ICD-10-CM

## 2015-05-01 DIAGNOSIS — F418 Other specified anxiety disorders: Secondary | ICD-10-CM

## 2015-05-01 DIAGNOSIS — Z1239 Encounter for other screening for malignant neoplasm of breast: Secondary | ICD-10-CM

## 2015-05-01 DIAGNOSIS — Z1151 Encounter for screening for human papillomavirus (HPV): Secondary | ICD-10-CM | POA: Diagnosis present

## 2015-05-01 DIAGNOSIS — Z0000 Encounter for general adult medical examination without abnormal findings: Secondary | ICD-10-CM

## 2015-05-01 DIAGNOSIS — Z13220 Encounter for screening for lipoid disorders: Secondary | ICD-10-CM | POA: Diagnosis not present

## 2015-05-01 DIAGNOSIS — Z01411 Encounter for gynecological examination (general) (routine) with abnormal findings: Secondary | ICD-10-CM | POA: Insufficient documentation

## 2015-05-01 NOTE — Progress Notes (Signed)
Patient ID: Gwendolyn Torres, female   DOB: January 15, 1971, 44 y.o.   MRN: [id number]   Subjective:    Patient ID: Gwendolyn Torres, female    DOB: [birth year], 44 y.o.   MRN: [id number]  HPI  Patient here for her physical exam.  She is doing relatively well.  On wellbutrin.  This works well for her.  We discussed diet and exercise.  She still tries to get to the gym.  Not exercising as much.  No chest pain or tightness.  No sob.  No acid reflux.  No abdominal pain or cramping.  Bowels stable.  No vaginal problems.  Periods regular.  Husband has had a vasectomy.     No past medical history on file. Past Surgical History  Procedure Laterality Date  . Cesarean section  2004   Family History  Problem Relation Age of Onset  . Aortic aneurysm Father     also has popliteal aneurysm  . Hypertension Father   . Hypercholesterolemia Father   . Diabetes Father   . Aortic aneurysm Mother   . Hypercholesterolemia Mother   . Hypercholesterolemia Sister     x2  . Hyperlipidemia Brother   . Hypertension Sister   . Breast cancer Maternal Aunt   . Melanoma Maternal Uncle   . Colon cancer Neg Hx    Social History   Social History  . Marital Status: Married    Spouse Name: N/A  . Number of Children: 1  . Years of Education: N/A   Occupational History  . transcriptionist    Social History Main Topics  . Smoking status: Never Smoker   . Smokeless tobacco: Never Used  . Alcohol Use: No  . Drug Use: No  . Sexual Activity: Not Asked   Other Topics Concern  . None   Social History Narrative    Outpatient Encounter Prescriptions as of 05/01/2015  Medication Sig  . buPROPion (WELLBUTRIN XL) 300 MG 24 hr tablet TAKE 1 TABLET (300 MG TOTAL) BY MOUTH DAILY.  Marland Kitchen ibuprofen (ADVIL,MOTRIN) 200 MG tablet Take 200 mg by mouth as needed for headache.    No facility-administered encounter medications on file as of 05/01/2015.    Review of Systems  Constitutional:       Not watching her diet and well.  Not  exercising regularly.  Plans to restart.   HENT: Negative for congestion and sinus pressure.   Eyes: Negative for pain and visual disturbance.  Respiratory: Negative for cough, chest tightness and shortness of breath.   Cardiovascular: Negative for chest pain, palpitations and leg swelling.  Gastrointestinal: Negative for nausea, vomiting, abdominal pain and diarrhea.  Genitourinary: Negative for dysuria and difficulty urinating.  Musculoskeletal: Negative for back pain and joint swelling.  Skin: Negative for color change and rash.  Neurological: Negative for dizziness and headaches.  Hematological: Negative for adenopathy. Does not bruise/bleed easily.  Psychiatric/Behavioral: Negative for dysphoric mood and agitation.       Objective:    Physical Exam  Constitutional: She is oriented to person, place, and time. She appears well-developed and well-nourished. No distress.  HENT:  Nose: Nose normal.  Mouth/Throat: Oropharynx is clear and moist.  Eyes: Right eye exhibits no discharge. Left eye exhibits no discharge. No scleral icterus.  Neck: Neck supple. No thyromegaly present.  Cardiovascular: Normal rate and regular rhythm.   Pulmonary/Chest: Breath sounds normal. No accessory muscle usage. No tachypnea. No respiratory distress. She has no decreased breath sounds. She has no wheezes. She  has no rhonchi. Right breast exhibits no inverted nipple, no mass, no nipple discharge and no tenderness (no axillary adenopathy). Left breast exhibits no inverted nipple, no mass, no nipple discharge and no tenderness (no axilarry adenopathy).  Abdominal: Soft. Bowel sounds are normal. There is no tenderness.  Genitourinary:  Normal external genitalia.  Vaginal vault without lesions.  Cervix identified.  Pap smear performed.  Could not appreciate any adnexal masses or tenderness.    Musculoskeletal: She exhibits no edema or tenderness.  Lymphadenopathy:    She has no cervical adenopathy.    Neurological: She is alert and oriented to person, place, and time.  Skin: Skin is warm. No rash noted. No erythema.  Psychiatric: She has a normal mood and affect. Her behavior is normal.    BP 121/76 mmHg  Pulse 71  Temp(Src) 98.3 F (36.8 C) (Oral)  Ht 5' 6.25" (1.683 m)  Wt 241 lb 6 oz (109.487 kg)  BMI 38.65 kg/m2  SpO2 100%  LMP 04/18/2015 Wt Readings from Last 3 Encounters:  05/01/15 241 lb 6 oz (109.487 kg)  01/30/15 239 lb 6 oz (108.58 kg)  03/16/14 211 lb 8 oz (95.936 kg)     Lab Results  Component Value Date   WBC 6.1 03/16/2014   HGB 13.0 03/16/2014   HCT 39.7 03/16/2014   PLT 292.0 03/16/2014   GLUCOSE 90 03/16/2014   CHOL 141 03/16/2014   TRIG 43.0 03/16/2014   HDL 48.90 03/16/2014   LDLCALC 84 03/16/2014   ALT 19 03/16/2014   AST 19 03/16/2014   NA 139 03/16/2014   K 4.5 04/03/2014   CL 107 03/16/2014   CREATININE 0.8 03/16/2014   BUN 15 03/16/2014   CO2 22 03/16/2014   TSH 1.67 03/16/2014       Assessment & Plan:   Problem List Items Addressed This Visit    Arm skin lesion, left - Primary    Persistent arm lesion.  Refer to dermatology.        Relevant Orders   Ambulatory referral to Dermatology   Health care maintenance    Physical today 05/01/15.  PAP 05/01/15.  Schedule mammogram.        Obesity (BMI 30-39.9)    Discussed diet and exercise.  Plans to get more serious about her diet.  Plans to start exercising more.       Situational anxiety    Doing well on wellbutrin.  Continue.        Relevant Orders   CBC with Differential/Platelet   Comprehensive metabolic panel   TSH    Other Visit Diagnoses    Breast cancer screening        Relevant Orders    MM DIGITAL SCREENING BILATERAL    Screening cholesterol level        Relevant Orders    Lipid panel    Cervical cancer screening        Relevant Orders    Cytology - PAP        Einar Pheasant, MD

## 2015-05-01 NOTE — Progress Notes (Signed)
Pre visit review using our clinic review tool, if applicable. No additional management support is needed unless otherwise documented below in the visit note. 

## 2015-05-02 ENCOUNTER — Encounter: Payer: Self-pay | Admitting: Internal Medicine

## 2015-05-02 DIAGNOSIS — Z0000 Encounter for general adult medical examination without abnormal findings: Secondary | ICD-10-CM | POA: Insufficient documentation

## 2015-05-02 LAB — CYTOLOGY - PAP

## 2015-05-02 NOTE — Assessment & Plan Note (Signed)
Discussed diet and exercise.  Plans to get more serious about her diet.  Plans to start exercising more.

## 2015-05-02 NOTE — Assessment & Plan Note (Signed)
Persistent arm lesion.  Refer to dermatology.

## 2015-05-02 NOTE — Assessment & Plan Note (Signed)
Doing well on wellbutrin.  Continue.

## 2015-05-02 NOTE — Assessment & Plan Note (Signed)
Physical today 05/01/15.  PAP 05/01/15.  Schedule mammogram.

## 2015-05-07 ENCOUNTER — Encounter: Payer: Self-pay | Admitting: Internal Medicine

## 2015-05-17 ENCOUNTER — Other Ambulatory Visit: Payer: 59

## 2015-06-14 ENCOUNTER — Other Ambulatory Visit: Payer: 59

## 2015-06-28 ENCOUNTER — Ambulatory Visit
Admission: RE | Admit: 2015-06-28 | Discharge: 2015-06-28 | Disposition: A | Discharge status: Home / Self Care | Payer: 59 | Source: Ambulatory Visit | Attending: Internal Medicine | Admitting: Internal Medicine

## 2015-06-28 DIAGNOSIS — Z1239 Encounter for other screening for malignant neoplasm of breast: Secondary | ICD-10-CM

## 2015-06-29 ENCOUNTER — Other Ambulatory Visit: Payer: Self-pay | Admitting: Internal Medicine

## 2015-07-02 ENCOUNTER — Ambulatory Visit
Admission: RE | Admit: 2015-07-02 | Discharge: 2015-07-02 | Disposition: A | Discharge status: Home / Self Care | Payer: 59 | Source: Ambulatory Visit | Attending: Internal Medicine | Admitting: Internal Medicine

## 2015-07-02 ENCOUNTER — Other Ambulatory Visit: Payer: Self-pay | Admitting: Internal Medicine

## 2015-07-02 DIAGNOSIS — Z1239 Encounter for other screening for malignant neoplasm of breast: Secondary | ICD-10-CM

## 2015-07-02 DIAGNOSIS — Z1231 Encounter for screening mammogram for malignant neoplasm of breast: Secondary | ICD-10-CM | POA: Insufficient documentation

## 2015-08-16 ENCOUNTER — Encounter: Admission: RE | Payer: Self-pay | Source: Ambulatory Visit

## 2015-08-16 ENCOUNTER — Ambulatory Visit: Admission: RE | Admit: 2015-08-16 | Payer: BLUE CROSS/BLUE SHIELD | Source: Ambulatory Visit | Admitting: Podiatry

## 2015-08-16 SURGERY — FUSION, JOINT, GREAT TOE
Anesthesia: Regional | Laterality: Right

## 2015-09-09 ENCOUNTER — Other Ambulatory Visit: Payer: Self-pay | Admitting: Internal Medicine

## 2015-10-30 ENCOUNTER — Encounter: Payer: Self-pay | Admitting: Internal Medicine

## 2015-10-30 ENCOUNTER — Ambulatory Visit (INDEPENDENT_AMBULATORY_CARE_PROVIDER_SITE_OTHER): Payer: 59 | Admitting: Internal Medicine

## 2015-10-30 VITALS — BP 120/80 | HR 81 | Temp 98.4°F | Resp 18 | Ht 66.25 in | Wt 250.5 lb

## 2015-10-30 DIAGNOSIS — F418 Other specified anxiety disorders: Secondary | ICD-10-CM

## 2015-10-30 DIAGNOSIS — E669 Obesity, unspecified: Secondary | ICD-10-CM

## 2015-10-30 DIAGNOSIS — Z13220 Encounter for screening for lipoid disorders: Secondary | ICD-10-CM | POA: Diagnosis not present

## 2015-10-30 LAB — COMPREHENSIVE METABOLIC PANEL
ALBUMIN: 4.1 g/dL (ref 3.5–5.2)
ALT: 15 U/L (ref 0–35)
AST: 16 U/L (ref 0–37)
Alkaline Phosphatase: 72 U/L (ref 39–117)
BUN: 17 mg/dL (ref 6–23)
CALCIUM: 9.5 mg/dL (ref 8.4–10.5)
CHLORIDE: 106 meq/L (ref 96–112)
CO2: 26 meq/L (ref 19–32)
Creatinine, Ser: 0.83 mg/dL (ref 0.40–1.20)
GFR: 79.31 mL/min (ref >60.00)
Glucose, Bld: 103 mg/dL — ABNORMAL HIGH (ref 70–99)
POTASSIUM: 4.2 meq/L (ref 3.5–5.1)
Sodium: 135 mEq/L (ref 135–145)
Total Bilirubin: 0.5 mg/dL (ref 0.2–1.2)
Total Protein: 7.8 g/dL (ref 6.0–8.3)

## 2015-10-30 LAB — CBC WITH DIFFERENTIAL/PLATELET
BASOS PCT: 0.6 % (ref 0.0–3.0)
Basophils Absolute: 0.1 10*3/uL (ref 0.0–0.1)
EOS ABS: 0.1 10*3/uL (ref 0.0–0.7)
EOS PCT: 1.5 % (ref 0.0–5.0)
HEMATOCRIT: 39.5 % (ref 36.0–46.0)
HEMOGLOBIN: 13.1 g/dL (ref 12.0–15.0)
LYMPHS PCT: 22.6 % (ref 12.0–46.0)
Lymphs Abs: 2 10*3/uL (ref 0.7–4.0)
MCHC: 33.1 g/dL (ref 30.0–36.0)
MCV: 85.3 fl (ref 78.0–100.0)
MONO ABS: 0.8 10*3/uL (ref 0.1–1.0)
Monocytes Relative: 8.8 % (ref 3.0–12.0)
NEUTROS ABS: 5.9 10*3/uL (ref 1.4–7.7)
Neutrophils Relative %: 66.5 % (ref 43.0–77.0)
PLATELETS: 291 10*3/uL (ref 150.0–400.0)
RBC: 4.64 Mil/uL (ref 3.87–5.11)
RDW: 13.6 % (ref 11.5–15.5)
WBC: 8.9 10*3/uL (ref 4.0–10.5)

## 2015-10-30 LAB — LIPID PANEL
CHOL/HDL RATIO: 3
Cholesterol: 157 mg/dL (ref 0–200)
HDL: 52.1 mg/dL (ref >39.00)
LDL CALC: 84 mg/dL (ref 0–99)
NONHDL: 104.63
TRIGLYCERIDES: 101 mg/dL (ref 0.0–149.0)
VLDL: 20.2 mg/dL (ref 0.0–40.0)

## 2015-10-30 LAB — TSH: TSH: 1.47 u[IU]/mL (ref 0.35–4.50)

## 2015-10-30 NOTE — Assessment & Plan Note (Signed)
Diet and exercise.  Follow.  

## 2015-10-30 NOTE — Progress Notes (Signed)
Patient ID: Gwendolyn Torres, female   DOB: 05-Sep-1970, 45 y.o.   MRN: [id number]   Subjective:    Patient ID: Gwendolyn Torres, female    DOB: [birth year], 45 y.o.   MRN: [id number]  HPI  Patient here for a scheduled follow up.  States she is doing relatively well.  Has not been exercising regularly.  Plans to start.  Her and her daughter plan to go to [employer].  Breathing well.  No chest pain.  No sob.  No abdominal pain or cramping.  Bowels stable.  Increased stress with her mother's medical issues, etc.  Feels she is handling things well.     No past medical history on file. Past Surgical History  Procedure Laterality Date  . Cesarean section  2004   Family History  Problem Relation Age of Onset  . Aortic aneurysm Father     also has popliteal aneurysm  . Hypertension Father   . Hypercholesterolemia Father   . Diabetes Father   . Aortic aneurysm Mother   . Hypercholesterolemia Mother   . Hypercholesterolemia Sister     x2  . Hyperlipidemia Brother   . Hypertension Sister   . Breast cancer Maternal Aunt   . Melanoma Maternal Uncle   . Colon cancer Neg Hx    Social History   Social History  . Marital Status: Married    Spouse Name: N/A  . Number of Children: 1  . Years of Education: N/A   Occupational History  . transcriptionist    Social History Main Topics  . Smoking status: Never Smoker   . Smokeless tobacco: Never Used  . Alcohol Use: No  . Drug Use: No  . Sexual Activity: Not Asked   Other Topics Concern  . None   Social History Narrative    Outpatient Encounter Prescriptions as of 10/30/2015  Medication Sig  . buPROPion (WELLBUTRIN XL) 300 MG 24 hr tablet TAKE 1 TABLET (300 MG TOTAL) BY MOUTH DAILY.  Marland Kitchen ibuprofen (ADVIL,MOTRIN) 200 MG tablet Take 200 mg by mouth as needed for headache.    No facility-administered encounter medications on file as of 10/30/2015.    Review of Systems  Constitutional: Negative for appetite change and unexpected weight  change.  HENT: Negative for congestion and sinus pressure.   Respiratory: Negative for cough, chest tightness and shortness of breath.   Cardiovascular: Negative for chest pain, palpitations and leg swelling.  Gastrointestinal: Negative for nausea, vomiting, abdominal pain and diarrhea.  Genitourinary: Negative for dysuria and difficulty urinating.  Musculoskeletal: Negative for back pain and joint swelling.  Skin: Negative for color change and rash.  Neurological: Negative for dizziness, light-headedness and headaches.  Psychiatric/Behavioral: Negative for dysphoric mood and agitation.       Objective:    Physical Exam  Constitutional: She appears well-developed and well-nourished. No distress.  HENT:  Nose: Nose normal.  Mouth/Throat: Oropharynx is clear and moist.  Neck: Neck supple. No thyromegaly present.  Cardiovascular: Normal rate and regular rhythm.   Pulmonary/Chest: Breath sounds normal. No respiratory distress. She has no wheezes.  Abdominal: Soft. Bowel sounds are normal. There is no tenderness.  Musculoskeletal: She exhibits no edema or tenderness.  Lymphadenopathy:    She has no cervical adenopathy.  Skin: No rash noted. No erythema.  Psychiatric: She has a normal mood and affect. Her behavior is normal.    BP 120/80 mmHg  Pulse 81  Temp(Src) 98.4 F (36.9 C) (Oral)  Resp 18  Ht 5'  6.25" (1.683 m)  Wt 250 lb 8 oz (113.626 kg)  BMI 40.12 kg/m2  SpO2 98% Wt Readings from Last 3 Encounters:  10/30/15 250 lb 8 oz (113.626 kg)  05/01/15 241 lb 6 oz (109.487 kg)  01/30/15 239 lb 6 oz (108.58 kg)     Lab Results  Component Value Date   WBC 6.1 03/16/2014   HGB 13.0 03/16/2014   HCT 39.7 03/16/2014   PLT 292.0 03/16/2014   GLUCOSE 90 03/16/2014   CHOL 141 03/16/2014   TRIG 43.0 03/16/2014   HDL 48.90 03/16/2014   LDLCALC 84 03/16/2014   ALT 19 03/16/2014   AST 19 03/16/2014   NA 139 03/16/2014   K 4.5 04/03/2014   CL 107 03/16/2014   CREATININE  0.8 03/16/2014   BUN 15 03/16/2014   CO2 22 03/16/2014   TSH 1.67 03/16/2014    Mm Screening Breast Tomo Bilateral  07/03/2015  CLINICAL DATA:  Screening. EXAM: DIGITAL SCREENING BILATERAL MAMMOGRAM WITH 3D TOMO WITH CAD COMPARISON:  Previous exam(s). ACR Breast Density Category b: There are scattered areas of fibroglandular density. FINDINGS: There are no findings suspicious for malignancy. Images were processed with CAD. IMPRESSION: No mammographic evidence of malignancy. A result letter of this screening mammogram will be mailed directly to the patient. RECOMMENDATION: Screening mammogram in one year. (Code:SM-B-01Y) BI-RADS CATEGORY  1: Negative. Electronically Signed   By: Altamese Cabal M.D.   On: 07/03/2015 08:41       Assessment & Plan:   Problem List Items Addressed This Visit    Obesity (BMI 30-39.9)    Diet and exercise.  Follow.       Situational anxiety - Primary    Doing well on current regimen.  Follow.  Does not feel needs any further intervention.        Relevant Orders   CBC with Differential/Platelet   Comprehensive metabolic panel   TSH    Other Visit Diagnoses    Screening cholesterol level        Relevant Orders    Lipid panel        Einar Pheasant, MD

## 2015-10-30 NOTE — Progress Notes (Signed)
Pre-visit discussion using our clinic review tool. No additional management support is needed unless otherwise documented below in the visit note.  

## 2015-10-30 NOTE — Assessment & Plan Note (Signed)
Doing well on current regimen.  Follow.  Does not feel needs any further intervention.

## 2015-11-02 NOTE — Telephone Encounter (Signed)
Unread mychart message mailed to patient 

## 2015-11-16 ENCOUNTER — Other Ambulatory Visit: Payer: Self-pay | Admitting: Internal Medicine

## 2016-02-04 ENCOUNTER — Other Ambulatory Visit: Payer: Self-pay

## 2016-02-04 MED ORDER — BUPROPION HCL ER (XL) 300 MG PO TB24: 300.0000 mg | ORAL_TABLET | Freq: Every day | ORAL | 0 refills | Status: DC

## 2016-02-04 NOTE — Progress Notes (Signed)
Rx request from [facility] for 90 days, approved

## 2016-05-01 ENCOUNTER — Encounter: Payer: 59 | Admitting: Internal Medicine

## 2016-05-01 ENCOUNTER — Encounter: Payer: Self-pay | Admitting: Internal Medicine

## 2016-05-01 ENCOUNTER — Ambulatory Visit (INDEPENDENT_AMBULATORY_CARE_PROVIDER_SITE_OTHER): Payer: 59 | Admitting: Internal Medicine

## 2016-05-01 VITALS — BP 118/76 | HR 90 | Temp 98.0°F | Ht 66.0 in | Wt 260.0 lb

## 2016-05-01 DIAGNOSIS — Z01818 Encounter for other preprocedural examination: Secondary | ICD-10-CM

## 2016-05-01 DIAGNOSIS — Z1231 Encounter for screening mammogram for malignant neoplasm of breast: Secondary | ICD-10-CM

## 2016-05-01 DIAGNOSIS — Z0000 Encounter for general adult medical examination without abnormal findings: Secondary | ICD-10-CM | POA: Diagnosis not present

## 2016-05-01 DIAGNOSIS — Z9109 Other allergy status, other than to drugs and biological substances: Secondary | ICD-10-CM

## 2016-05-01 DIAGNOSIS — Z1239 Encounter for other screening for malignant neoplasm of breast: Secondary | ICD-10-CM

## 2016-05-01 DIAGNOSIS — F418 Other specified anxiety disorders: Secondary | ICD-10-CM

## 2016-05-01 DIAGNOSIS — E669 Obesity, unspecified: Secondary | ICD-10-CM | POA: Diagnosis not present

## 2016-05-01 NOTE — Patient Instructions (Signed)
flonase or nasacort nasal spray - 2 sprays each nostril one time per day.  Do this in the evening.   

## 2016-05-01 NOTE — Assessment & Plan Note (Signed)
Physical today 05/01/16.  PAP 05/01/15 - negative with scan cellularity (satisfactory for evaluation).  Negative HPV.  Mammogram 07/03/15 - birads I

## 2016-05-01 NOTE — Progress Notes (Signed)
Pre visit review using our clinic review tool, if applicable. No additional management support is needed unless otherwise documented below in the visit note. 

## 2016-05-01 NOTE — Assessment & Plan Note (Signed)
Increased stress as outlined.  Overall handling things relatively well.  Has good support.  Continue wellbutrin.  Follow.

## 2016-05-01 NOTE — Addendum Note (Signed)
Addended by: Alisa Graff on: 05/01/2016 10:11 AM   Modules accepted: Orders

## 2016-05-01 NOTE — Assessment & Plan Note (Signed)
Planning for foot surgery.  Seeing podiatry.  She will drop off form when knows date of surgery.  I do feel she is at low risk from a cardiac standpoint to proceed with the planned surgery.  Will need close intra op and post op monitoring of her heart rate and blood pressure to avoid extremes.

## 2016-05-01 NOTE — Assessment & Plan Note (Signed)
Diet and exercise.  Follow.  

## 2016-05-01 NOTE — Progress Notes (Signed)
Patient ID: Gwendolyn Torres, female   DOB: February 17, 1971, 45 y.o.   MRN: [id number]   Subjective:    Patient ID: Gwendolyn Torres, female    DOB: 1970/09/19, 45 y.o.   MRN: [id number]  HPI  Patient here for her physical exam.  Increased stress with taking care of her mother and with her father's death.  She overall feels she is handling things relatively well.  Planning to see Hospice counselor.  Does not feel needs anything more at this time.  No chest pain.  No sob.  No acid reflux.  No abdominal pain or cramping.  Bowels stable.  Planning to have foot surgery soon.  She will notify me when scheduled.  Will need clearance form completed.     History reviewed. No pertinent past medical history. Past Surgical History:  Procedure Laterality Date  . CESAREAN SECTION  2004   Family History  Problem Relation Age of Onset  . Aortic aneurysm Father     also has popliteal aneurysm  . Hypertension Father   . Hypercholesterolemia Father   . Diabetes Father   . Aortic aneurysm Mother   . Hypercholesterolemia Mother   . Hypercholesterolemia Sister     x2  . Hyperlipidemia Brother   . Hypertension Sister   . Breast cancer Maternal Aunt   . Melanoma Maternal Uncle   . Colon cancer Neg Hx    Social History   Social History  . Marital status: Married    Spouse name: N/A  . Number of children: 1  . Years of education: N/A   Occupational History  . transcriptionist    Social History Main Topics  . Smoking status: Never Smoker  . Smokeless tobacco: Never Used  . Alcohol use No  . Drug use: No  . Sexual activity: Not Asked   Other Topics Concern  . None   Social History Narrative  . None    Outpatient Encounter Prescriptions as of 05/01/2016  Medication Sig  . buPROPion (WELLBUTRIN XL) 300 MG 24 hr tablet Take 1 tablet (300 mg total) by mouth daily.  Marland Kitchen ibuprofen (ADVIL,MOTRIN) 200 MG tablet Take 200 mg by mouth as needed for headache.    No facility-administered encounter medications  on file as of 05/01/2016.     Review of Systems  Constitutional: Negative for appetite change and unexpected weight change.  HENT: Negative for congestion and sinus pressure.        Some allergy symptoms with some drainage.  Minimal.    Eyes: Negative for pain and visual disturbance.  Respiratory: Negative for cough, chest tightness and shortness of breath.   Cardiovascular: Negative for chest pain, palpitations and leg swelling.  Gastrointestinal: Negative for abdominal pain, diarrhea, nausea and vomiting.  Genitourinary: Negative for difficulty urinating and dysuria.  Musculoskeletal: Negative for back pain and joint swelling.  Skin: Negative for color change and rash.  Neurological: Negative for dizziness, light-headedness and headaches.  Hematological: Negative for adenopathy. Does not bruise/bleed easily.  Psychiatric/Behavioral: Negative for agitation and dysphoric mood.       Objective:    Physical Exam  Constitutional: She is oriented to person, place, and time. She appears well-developed and well-nourished. No distress.  HENT:  Nose: Nose normal.  Mouth/Throat: Oropharynx is clear and moist.  Eyes: Right eye exhibits no discharge. Left eye exhibits no discharge. No scleral icterus.  Neck: Neck supple. No thyromegaly present.  Cardiovascular: Normal rate and regular rhythm.   Pulmonary/Chest: Breath sounds normal. No accessory  muscle usage. No tachypnea. No respiratory distress. She has no decreased breath sounds. She has no wheezes. She has no rhonchi. Right breast exhibits no inverted nipple, no mass, no nipple discharge and no tenderness (no axillary adenopathy). Left breast exhibits no inverted nipple, no mass, no nipple discharge and no tenderness (no axilarry adenopathy).  Abdominal: Soft. Bowel sounds are normal. There is no tenderness.  Musculoskeletal: She exhibits no edema or tenderness.  Lymphadenopathy:    She has no cervical adenopathy.  Neurological: She is  alert and oriented to person, place, and time.  Skin: Skin is warm. No rash noted. No erythema.  Psychiatric: She has a normal mood and affect. Her behavior is normal.    BP 118/76   Pulse 90   Temp 98 F (36.7 C) (Oral)   Ht 5\' 6"  (1.676 m)   Wt 260 lb (117.9 kg)   LMP 04/14/2016   SpO2 98%   BMI 41.97 kg/m  Wt Readings from Last 3 Encounters:  05/01/16 260 lb (117.9 kg)  10/30/15 250 lb 8 oz (113.6 kg)  05/01/15 241 lb 6 oz (109.5 kg)     Lab Results  Component Value Date   WBC 8.9 10/30/2015   HGB 13.1 10/30/2015   HCT 39.5 10/30/2015   PLT 291.0 10/30/2015   GLUCOSE 103 (H) 10/30/2015   CHOL 157 10/30/2015   TRIG 101.0 10/30/2015   HDL 52.10 10/30/2015   LDLCALC 84 10/30/2015   ALT 15 10/30/2015   AST 16 10/30/2015   NA 135 10/30/2015   K 4.2 10/30/2015   CL 106 10/30/2015   CREATININE 0.83 10/30/2015   BUN 17 10/30/2015   CO2 26 10/30/2015   TSH 1.47 10/30/2015    Mm Screening Breast Tomo Bilateral  Result Date: 07/03/2015 CLINICAL DATA:  Screening. EXAM: DIGITAL SCREENING BILATERAL MAMMOGRAM WITH 3D TOMO WITH CAD COMPARISON:  Previous exam(s). ACR Breast Density Category b: There are scattered areas of fibroglandular density. FINDINGS: There are no findings suspicious for malignancy. Images were processed with CAD. IMPRESSION: No mammographic evidence of malignancy. A result letter of this screening mammogram will be mailed directly to the patient. RECOMMENDATION: Screening mammogram in one year. (Code:SM-B-01Y) BI-RADS CATEGORY  1: Negative. Electronically Signed   By: Altamese Cabal M.D.   On: 07/03/2015 08:41       Assessment & Plan:   Problem List Items Addressed This Visit    Health care maintenance    Physical today 05/01/16.  PAP 05/01/15 - negative with scan cellularity (satisfactory for evaluation).  Negative HPV.  Mammogram 07/03/15 - birads I      Obesity (BMI 30-39.9)    Diet and exercise.  Follow.        Pre-op evaluation    Planning  for foot surgery.  Seeing podiatry.  She will drop off form when knows date of surgery.  I do feel she is at low risk from a cardiac standpoint to proceed with the planned surgery.  Will need close intra op and post op monitoring of her heart rate and blood pressure to avoid extremes.        Situational anxiety    Increased stress as outlined.  Overall handling things relatively well.  Has good support.  Continue wellbutrin.  Follow.         Other Visit Diagnoses    Environmental allergies    -  Primary   Taking zyrtec D.  helping.  flonase or nasacort nasal spray as directed.  follow.  Einar Pheasant, MD

## 2016-05-23 ENCOUNTER — Other Ambulatory Visit: Payer: Self-pay | Admitting: Internal Medicine

## 2016-09-01 ENCOUNTER — Ambulatory Visit
Admission: RE | Admit: 2016-09-01 | Discharge: 2016-09-01 | Disposition: A | Discharge status: Home / Self Care | Payer: Managed Care, Other (non HMO) | Source: Ambulatory Visit | Attending: Internal Medicine | Admitting: Internal Medicine

## 2016-09-01 DIAGNOSIS — Z1239 Encounter for other screening for malignant neoplasm of breast: Secondary | ICD-10-CM

## 2016-09-01 DIAGNOSIS — R928 Other abnormal and inconclusive findings on diagnostic imaging of breast: Secondary | ICD-10-CM | POA: Insufficient documentation

## 2016-09-01 DIAGNOSIS — Z1231 Encounter for screening mammogram for malignant neoplasm of breast: Secondary | ICD-10-CM | POA: Diagnosis not present

## 2016-09-03 ENCOUNTER — Other Ambulatory Visit: Payer: Self-pay | Admitting: Internal Medicine

## 2016-09-03 DIAGNOSIS — R928 Other abnormal and inconclusive findings on diagnostic imaging of breast: Secondary | ICD-10-CM

## 2016-09-03 DIAGNOSIS — N6489 Other specified disorders of breast: Secondary | ICD-10-CM

## 2016-09-09 ENCOUNTER — Ambulatory Visit
Admission: RE | Admit: 2016-09-09 | Discharge: 2016-09-09 | Disposition: A | Discharge status: Home / Self Care | Payer: Managed Care, Other (non HMO) | Source: Ambulatory Visit | Attending: Internal Medicine | Admitting: Internal Medicine

## 2016-09-09 DIAGNOSIS — N6489 Other specified disorders of breast: Secondary | ICD-10-CM

## 2016-09-09 DIAGNOSIS — R928 Other abnormal and inconclusive findings on diagnostic imaging of breast: Secondary | ICD-10-CM

## 2016-09-11 ENCOUNTER — Telehealth: Payer: Self-pay

## 2016-09-11 NOTE — Telephone Encounter (Signed)
Clearance received from [facility]. I have called ok to do off of 12/17 office visit if you are ok with that. Patient had to reschedule surgery I have put in your red folder

## 2016-09-12 NOTE — Telephone Encounter (Signed)
I am ok with using the previous office note.  Need to make sure she does not have to be seen within 30 days of surgery.  Let me know if any problems or if I need to do something.  Thanks

## 2016-09-12 NOTE — Telephone Encounter (Signed)
No I called the office yesterday and gave them the date of last o/v and they said that as long as you are ok with it they are as well.

## 2016-09-16 NOTE — Telephone Encounter (Signed)
Form completed.  See note.  Need to confirm with pt no h/o anesthesia problems and no family history of anesthesia problems.  Also, please make podiatry aware that no ekg was done at her previous visit.  Will need office visit if needs EKG.  I can work her in if needed.

## 2016-09-16 NOTE — Telephone Encounter (Signed)
Called patient no history of problems with anesthesia with self or family. Spoke to [place] at [facility] and she does not need EKG. She does not have any history of cardiac issues. She asked that I fax form to her at [phone]. I have called patient and let her know as well.

## 2016-09-19 NOTE — Discharge Instructions (Signed)
[facility] °[facility] ° °POST OPERATIVE INSTRUCTIONS FOR DR. TROXLER AND DR. FOWLER °[facility] ° ° °1. Take your medication as prescribed.  Pain medication should be taken only as needed. ° °2. Keep the dressing clean, dry and intact. ° °3. Keep your foot elevated above the heart level for the first 48 hours. ° °4. Walking to the bathroom and brief periods of walking are acceptable, unless we have instructed you to be non-weight bearing. ° °5. Always wear your post-op shoe when walking.  Always use your crutches if you are to be non-weight bearing. ° °6. Do not take a shower. Baths are permissible as long as the foot is kept out of the water.  ° °7. Every hour you are awake:  °- Bend your knee 15 times. °- Flex foot 15 times °- Massage calf 15 times ° °8. Call [facility] ([phone]) if any of the following problems occur: °- You develop a temperature or fever. °- The bandage becomes saturated with blood. °- Medication does not stop your pain. °- Injury of the foot occurs. °- Any symptoms of infection including redness, odor, or red streaks running from wound. ° ° °General Anesthesia, Adult, Care After °These instructions provide you with information about caring for yourself after your procedure. Your health care provider may also give you more specific instructions. Your treatment has been planned according to current medical practices, but problems sometimes occur. Call your health care provider if you have any problems or questions after your procedure. °What can I expect after the procedure? °After the procedure, it is common to have: °· Vomiting. °· A sore throat. °· Mental slowness. °It is common to feel: °· Nauseous. °· Cold or shivery. °· Sleepy. °· Tired. °· Sore or achy, even in parts of your body where you did not have surgery. °Follow these instructions at home: °For at least 24 hours after the procedure: °· Do not: °¨ Participate in  activities where you could fall or become injured. °¨ Drive. °¨ Use heavy machinery. °¨ Drink alcohol. °¨ Take sleeping pills or medicines that cause drowsiness. °¨ Make important decisions or sign legal documents. °¨ Take care of children on your own. °· Rest. °Eating and drinking °· If you vomit, drink water, juice, or soup when you can drink without vomiting. °· Drink enough fluid to keep your urine clear or pale yellow. °· Make sure you have little or no nausea before eating solid foods. °· Follow the diet recommended by your health care provider. °General instructions °· Have a responsible adult stay with you until you are awake and alert. °· Return to your normal activities as told by your health care provider. Ask your health care provider what activities are safe for you. °· Take over-the-counter and prescription medicines only as told by your health care provider. °· If you smoke, do not smoke without supervision. °· Keep all follow-up visits as told by your health care provider. This is important. °Contact a health care provider if: °· You continue to have nausea or vomiting at home, and medicines are not helpful. °· You cannot drink fluids or start eating again. °· You cannot urinate after 8-12 hours. °· You develop a skin rash. °· You have fever. °· You have increasing redness at the site of your procedure. °Get help right away if: °· You have difficulty breathing. °· You have chest pain. °· You have unexpected bleeding. °· You feel that you are having   a life-threatening or urgent problem. °This information is not intended to replace advice given to you by your health care provider. Make sure you discuss any questions you have with your health care provider. °Document Released: 07/28/2000 Document Revised: 09/24/2015 Document Reviewed: 04/05/2015 °Elsevier Interactive Patient Education © 2017 [employer] ° °

## 2016-09-25 ENCOUNTER — Ambulatory Visit: Payer: Managed Care, Other (non HMO) | Admitting: Anesthesiology

## 2016-09-25 ENCOUNTER — Encounter
Admission: RE | Disposition: A | Discharge status: Home / Self Care | Payer: Self-pay | Source: Ambulatory Visit | Attending: Podiatry

## 2016-09-25 ENCOUNTER — Ambulatory Visit
Admission: RE | Admit: 2016-09-25 | Discharge: 2016-09-25 | Disposition: A | Discharge status: Home / Self Care | Payer: Managed Care, Other (non HMO) | Source: Ambulatory Visit | Attending: Podiatry | Admitting: Podiatry

## 2016-09-25 DIAGNOSIS — F419 Anxiety disorder, unspecified: Secondary | ICD-10-CM | POA: Insufficient documentation

## 2016-09-25 DIAGNOSIS — Z6841 Body Mass Index (BMI) 40.0 and over, adult: Secondary | ICD-10-CM | POA: Insufficient documentation

## 2016-09-25 DIAGNOSIS — M2011 Hallux valgus (acquired), right foot: Secondary | ICD-10-CM | POA: Diagnosis not present

## 2016-09-25 DIAGNOSIS — G8929 Other chronic pain: Secondary | ICD-10-CM | POA: Diagnosis not present

## 2016-09-25 DIAGNOSIS — K219 Gastro-esophageal reflux disease without esophagitis: Secondary | ICD-10-CM | POA: Diagnosis not present

## 2016-09-25 HISTORY — PX: BUNIONECTOMY: SHX129

## 2016-09-25 HISTORY — DX: Gastro-esophageal reflux disease without esophagitis: K21.9

## 2016-09-25 HISTORY — PX: OSTECTOMY: SHX6439

## 2016-09-25 SURGERY — OSTECTOMY
Anesthesia: General | Laterality: Right | Wound class: Clean

## 2016-09-25 MED ORDER — LACTATED RINGERS IV SOLN
10.0000 mL/h | INTRAVENOUS | Status: DC
  Administered 2016-09-25: 10:00:00 via INTRAVENOUS
  Administered 2016-09-25: 10 mL/h via INTRAVENOUS

## 2016-09-25 MED ORDER — BUPIVACAINE LIPOSOME 1.3 % IJ SUSP
INTRAMUSCULAR | Status: DC | PRN
  Administered 2016-09-25: 15 mL

## 2016-09-25 MED ORDER — LIDOCAINE HCL (CARDIAC) 20 MG/ML IV SOLN
INTRAVENOUS | Status: DC | PRN
  Administered 2016-09-25: 50 mg via INTRATRACHEAL

## 2016-09-25 MED ORDER — GLYCOPYRROLATE 0.2 MG/ML IJ SOLN
INTRAMUSCULAR | Status: DC | PRN
  Administered 2016-09-25: 0.1 mg via INTRAVENOUS

## 2016-09-25 MED ORDER — PROMETHAZINE HCL 25 MG/ML IJ SOLN: 6.2500 mg | INTRAMUSCULAR | Status: DC | PRN

## 2016-09-25 MED ORDER — DEXAMETHASONE SODIUM PHOSPHATE 4 MG/ML IJ SOLN
INTRAMUSCULAR | Status: DC | PRN
  Administered 2016-09-25: 4 mg via INTRAVENOUS

## 2016-09-25 MED ORDER — FENTANYL CITRATE (PF) 100 MCG/2ML IJ SOLN: 25.0000 ug | INTRAMUSCULAR | Status: DC | PRN

## 2016-09-25 MED ORDER — ONDANSETRON HCL 4 MG/2ML IJ SOLN
4.0000 mg | Freq: Once | INTRAMUSCULAR | Status: AC
  Administered 2016-09-25: 4 mg via INTRAVENOUS

## 2016-09-25 MED ORDER — ONDANSETRON HCL 4 MG/2ML IJ SOLN
INTRAMUSCULAR | Status: DC | PRN
  Administered 2016-09-25: 4 mg via INTRAVENOUS

## 2016-09-25 MED ORDER — MIDAZOLAM HCL 5 MG/5ML IJ SOLN
INTRAMUSCULAR | Status: DC | PRN
  Administered 2016-09-25: 2 mg via INTRAVENOUS

## 2016-09-25 MED ORDER — LIDOCAINE HCL (PF) 1 % IJ SOLN
INTRAMUSCULAR | Status: DC | PRN
  Administered 2016-09-25: 7.5 mL

## 2016-09-25 MED ORDER — HYDROCODONE-ACETAMINOPHEN 7.5-325 MG PO TABS: 1.0000 | ORAL_TABLET | Freq: Four times a day (QID) | ORAL | 0 refills | Status: DC | PRN

## 2016-09-25 MED ORDER — ACETAMINOPHEN 325 MG PO TABS: 325.0000 mg | ORAL_TABLET | ORAL | Status: DC | PRN

## 2016-09-25 MED ORDER — OXYCODONE HCL 5 MG/5ML PO SOLN: 5.0000 mg | Freq: Once | ORAL | Status: DC | PRN

## 2016-09-25 MED ORDER — ACETAMINOPHEN 160 MG/5ML PO SOLN: 325.0000 mg | ORAL | Status: DC | PRN

## 2016-09-25 MED ORDER — BUPIVACAINE HCL (PF) 0.5 % IJ SOLN
INTRAMUSCULAR | Status: DC | PRN
  Administered 2016-09-25: 7.5 mL

## 2016-09-25 MED ORDER — PROPOFOL 10 MG/ML IV BOLUS
INTRAVENOUS | Status: DC | PRN
  Administered 2016-09-25: 150 mg via INTRAVENOUS
  Administered 2016-09-25: 50 mg via INTRAVENOUS

## 2016-09-25 MED ORDER — OXYCODONE HCL 5 MG PO TABS: 5.0000 mg | ORAL_TABLET | Freq: Once | ORAL | Status: DC | PRN

## 2016-09-25 MED ORDER — FENTANYL CITRATE (PF) 100 MCG/2ML IJ SOLN
INTRAMUSCULAR | Status: DC | PRN
  Administered 2016-09-25: 12.5 ug via INTRAVENOUS
  Administered 2016-09-25: 50 ug via INTRAVENOUS
  Administered 2016-09-25: 12.5 ug via INTRAVENOUS
  Administered 2016-09-25: 50 ug via INTRAVENOUS

## 2016-09-25 MED ORDER — DEXTROSE 5 % IV SOLN
600.0000 mg | Freq: Once | INTRAVENOUS | Status: AC
  Administered 2016-09-25: 600 mg via INTRAVENOUS

## 2016-09-25 SURGICAL SUPPLY — 46 items
BANDAGE ELASTIC 4 VELCRO NS (GAUZE/BANDAGES/DRESSINGS) ×2 IMPLANT
BENZOIN TINCTURE PRP APPL 2/3 (GAUZE/BANDAGES/DRESSINGS) ×2 IMPLANT
BIT DRILL 1.7 LNG CANN (DRILL) ×2 IMPLANT
BLADE MED AGGRESSIVE (BLADE) ×2 IMPLANT
BLADE MINI RND TIP GREEN BEAV (BLADE) ×2 IMPLANT
BLADE OSC/SAGITTAL MD 5.5X18 (BLADE) ×2 IMPLANT
BLADE SAW LAPIPLASTY 40X11 (INSTRUMENTS) ×2 IMPLANT
BNDG ESMARK 4X12 TAN STRL LF (GAUZE/BANDAGES/DRESSINGS) ×2 IMPLANT
BNDG GAUZE 4.5X4.1 6PLY STRL (MISCELLANEOUS) ×2 IMPLANT
BNDG STRETCH 4X75 STRL LF (GAUZE/BANDAGES/DRESSINGS) ×2 IMPLANT
CANISTER SUCT 1200ML W/VALVE (MISCELLANEOUS) ×2 IMPLANT
CAST PADDING 3X4FT ST 30246 (SOFTGOODS) ×2
CNTRSNK DRL 2 HDLS SCR (MISCELLANEOUS) ×1 IMPLANT
CONTROL 360 (Bone Implant) ×2 IMPLANT
COUNTERSINK 2.0 (MISCELLANEOUS) ×1
COVER LIGHT HANDLE UNIVERSAL (MISCELLANEOUS) ×4 IMPLANT
COVER PIN YLW 0.028-062 (MISCELLANEOUS) IMPLANT
CUFF TOURN SGL QUICK 18 (TOURNIQUET CUFF) ×2 IMPLANT
DRAPE FLUOR MINI C-ARM 54X84 (DRAPES) ×2 IMPLANT
DURAPREP 26ML APPLICATOR (WOUND CARE) ×2 IMPLANT
GAUZE PETRO XEROFOAM 1X8 (MISCELLANEOUS) ×2 IMPLANT
GAUZE SPONGE 4X4 12PLY STRL (GAUZE/BANDAGES/DRESSINGS) ×2 IMPLANT
GLOVE BIO SURGEON STRL SZ8 (GLOVE) ×2 IMPLANT
GOWN STRL REUS W/ TWL LRG LVL3 (GOWN DISPOSABLE) ×1 IMPLANT
GOWN STRL REUS W/ TWL XL LVL3 (GOWN DISPOSABLE) ×1 IMPLANT
GOWN STRL REUS W/TWL LRG LVL3 (GOWN DISPOSABLE) ×1
GOWN STRL REUS W/TWL XL LVL3 (GOWN DISPOSABLE) ×1
K-WIRE ×2 IMPLANT
KIT ROOM TURNOVER OR (KITS) ×2 IMPLANT
KIT STAPLE ARCUS 8X8 STRL (Staple) ×2 IMPLANT
NEEDLE HYPO 18GX1.5 BLUNT FILL (NEEDLE) ×2 IMPLANT
NEEDLE HYPO 25GX1X1/2 BEV (NEEDLE) ×2 IMPLANT
NS IRRIG 500ML POUR BTL (IV SOLUTION) ×2 IMPLANT
PACK EXTREMITY ARMC (MISCELLANEOUS) ×2 IMPLANT
PAD CAST CTTN 3X4 STRL (SOFTGOODS) ×2 IMPLANT
PAD GROUND ADULT SPLIT (MISCELLANEOUS) ×2 IMPLANT
RASP SM TEAR CROSS CUT (RASP) ×2 IMPLANT
SCREW HEADLESS 2.0X14MM (Screw) ×4 IMPLANT
SCREW HEADLESS SHRT THRD 2X12 (Screw) ×4 IMPLANT
STOCKINETTE STRL 6IN 960660 (GAUZE/BANDAGES/DRESSINGS) ×2 IMPLANT
STRAP BODY AND KNEE 60X3 (MISCELLANEOUS) ×2 IMPLANT
STRIP CLOSURE SKIN 1/4X4 (GAUZE/BANDAGES/DRESSINGS) ×2 IMPLANT
SUT VIC AB 3-0 SH 27 (SUTURE) ×1
SUT VIC AB 3-0 SH 27X BRD (SUTURE) ×1 IMPLANT
SUT VIC AB 4-0 FS2 27 (SUTURE) ×4 IMPLANT
SYRINGE 10CC LL (SYRINGE) ×4 IMPLANT

## 2016-09-25 NOTE — Anesthesia Preprocedure Evaluation (Signed)
Anesthesia Evaluation  Patient identified by MRN, date of birth, ID band Patient awake    Reviewed: Allergy & Precautions, H&P , NPO status   Airway Mallampati: III       Dental  (+) Teeth Intact   Pulmonary neg pulmonary ROS,    breath sounds clear to auscultation       Cardiovascular negative cardio ROS   Rhythm:regular Rate:Normal     Neuro/Psych PSYCHIATRIC DISORDERS (anxiety)    GI/Hepatic GERD  Controlled,  Endo/Other  Morbid obesity (BMI 40)  Renal/GU      Musculoskeletal   Abdominal   Peds  Hematology   Anesthesia Other Findings   Reproductive/Obstetrics                             Anesthesia Physical Anesthesia Plan  ASA: II  Anesthesia Plan: General LMA   Post-op Pain Management:    Induction:   Airway Management Planned:   Additional Equipment:   Intra-op Plan:   Post-operative Plan:   Informed Consent: I have reviewed the patients History and Physical, chart, labs and discussed the procedure including the risks, benefits and alternatives for the proposed anesthesia with the patient or authorized representative who has indicated his/her understanding and acceptance.     Plan Discussed with: CRNA  Anesthesia Plan Comments:         Anesthesia Quick Evaluation

## 2016-09-25 NOTE — Transfer of Care (Signed)
Immediate Anesthesia Transfer of Care Note  Patient: Gwendolyn Torres  Procedure(s) Performed: Procedure(s) with comments: OSTECTOMY RT  1SR,2ND 3RD  metatarsal (Right) - LMA LOCAL lapidus type  modified mcbride (Right)  Patient Location: PACU  Anesthesia Type: General LMA  Level of Consciousness: awake, alert  and patient cooperative  Airway and Oxygen Therapy: Patient Spontanous Breathing and Patient connected to supplemental oxygen  Post-op Assessment: Post-op Vital signs reviewed, Patient's Cardiovascular Status Stable, Respiratory Function Stable, Patent Airway and No signs of Nausea or vomiting  Post-op Vital Signs: Reviewed and stable  Complications: No apparent anesthesia complications

## 2016-09-25 NOTE — Anesthesia Procedure Notes (Signed)
Procedure Name: LMA Insertion Performed by: Mayme Genta Pre-anesthesia Checklist: Patient identified, Emergency Drugs available, Suction available, Timeout performed and Patient being monitored Patient Re-evaluated:Patient Re-evaluated prior to inductionOxygen Delivery Method: Circle system utilized Preoxygenation: Pre-oxygenation with 100% oxygen Intubation Type: IV induction LMA: LMA inserted LMA Size: 4.0 Number of attempts: 1 Placement Confirmation: positive ETCO2 and breath sounds checked- equal and bilateral Tube secured with: Tape

## 2016-09-25 NOTE — Anesthesia Postprocedure Evaluation (Signed)
Anesthesia Post Note  Patient: Gwendolyn Torres  Procedure(s) Performed: Procedure(s) (LRB): OSTECTOMY RT  1SR,2ND 3RD  metatarsal (Right) lapidus type  modified mcbride (Right)  Patient location during evaluation: PACU Anesthesia Type: General Level of consciousness: awake and alert Pain management: pain level controlled Vital Signs Assessment: post-procedure vital signs reviewed and stable Respiratory status: spontaneous breathing, nonlabored ventilation and respiratory function stable Cardiovascular status: stable Postop Assessment: no signs of nausea or vomiting Anesthetic complications: no    Veda Canning

## 2016-09-25 NOTE — H&P (Signed)
H and P has been reviewed and no changes are noted.  

## 2016-09-25 NOTE — Op Note (Signed)
Operative note   Surgeon: Dr. Albertine Patricia, DPM.    Assistant: None    Preop diagnosis: 1. Hallux abductovalgus and metatarsus primus varus right foot. 2. Plantar displaced elongated second metatarsal right foot 3. Plantar displaced elongated third metatarsal right foot    Postop diagnosis: Same    Procedure:   1. Lapidus hallux abductovalgus correction with Akin osteotomy to the proximal phalanx right foot   2. Osteotomy second metatarsal right foot   3. Osteotomy third metatarsal right foot     EBL: 20 cc    Anesthesia:general delivered by the anesthesia team and a local block delivered by me preoperatively with 15 cc of 1% lidocaine plain and 0.5% Marcaine plain mixed half-and-half. At the end of the case the area was blocked with Exprel mixed with 0.25% Marcaine plain 15 cc utilized.    Hemostasis: Ankle tourniquet at 225 mmHg pressure initially for 90 minutes tendon was released for over 20 minutes and then reefed inflated for 40 minutes.    Specimen: None    Complications: None    Operative indications: Chronic pain with worsening deformity unresponsive to conservative care    Procedure:  Patient was brought into the OR and placed on the operating table in thesupine position. After anesthesia was obtained theright lower extremity was prepped and draped in usual sterile fashion.  Operative Report: At this time attention was directed to the dorsum of the first metatarsophalangeal joint where a 4 cemented dorsolinear skin incision was made and deepened sharp blunt dissection bleeders clamped and bovied as required. Tissue was identified and incised longitudinally reflected away from the dorsal and medial and plantar aspects of the metatarsal medial head. A large eminence of bone was noted dorsomedially was resected and rasped smoothly. This time attention was directed lateral aspect the joint were Nidek 10 release trigger sesamoidal ligament release and lateral capsulotomy were  performed. At this time the incision was elongated proximally over the first metatarsal cuneiform joint deepened sharp blunt dissection bleeders clamped and bovied as required. Tissue overlying the joint was incised this freed away from the joint dorsally and medially. This point the joint was freed up and the metatarsal articulation was loosened from Summit soft tissue attachments. Once the metatarsal base was moving freely a laminoplasty technique was used to resect cartilage from both sides of the joint. Once the wedge of bone were removed a couple areas of cartilage are still noted these were then curetted. At this point the 2 articular surfaces were fenestrated with multiple drill holes to allow for better fusion. The first metatarsal was placed in a corrected position and 3 plain evaluation and once it was in good position was temporarily fixated with one threaded all wire 1 straight K wire. There is checked FluoroScan good position correction were noted. This 0.2 plates were placed across the area of both femoral 4 hole plates 2 screws proximal 2 screws distal. One was placed dorsally was placed medially. There is checked FluoroScan good position and correction were noted this timeframe.  This time attention was directed to the proximal phalanx where an osteotomy was performed to do a wedge cuts with apex lateral base medial a wedge was feathered and closed and a staple was placed across the osteotomy that time frame. The staple was a The 8 mm bone staple. This was seen to close and hold the osteotomy snugly. There is checked FluoroScan good position correction were noted fixation was good across the region. This point a medial capsulorrhaphy  was performed and closed with 3-0 Vicryl simple interrupted sutures. Upon loading the foot the toe was seen to sit in a rectus alignment with the toe straight with loading. Patient does have a severe cavovarus foot type and has contracture across the MTP joints which  is expected for that foot type.  This time all areas were copiously irrigated and the os and capsule tissue was closed long lengthy incision with 4 Vicryl continuous stitch. Extensor tendon was sutured back into a good position bringing it over more medially with 3-0 Vicryl simple interrupted sutures. Medial capsulorrhaphy was closed with 3-0 Vicryl simple interrupted sutures. Deep superficial fascial layers and closed with 4 Vicryl in continuous stitch skin was closed with 4 Vicryl in a subcuticular stitch.  Procedure #2 This time to his directed to the second metatarsal phalangeal joint area linear incision made over the joint joint through the skin and deepened sharp blunt dissection bleeders were clamped and bovied as required. Extension was notified and extensor hood release was performed in tendon was retracted laterally. This point Tissue overlying the second metatarsal distal shaft MTP joint was identified and incised longitudinally reflected mediolaterally. A medial capsulotomy was performed at the joint level. This point an osteotomy was performed at the osteochondral junction going from dorsal distal to plantar proximal in oblique fashion. A secondary cut was also made to allow for dorsiflexion of the metatarsal head 10 prevent plantarflexion with shortening of the metatarsal. At this point to debride fixation pins were placed across the area good position correction were noted. This 0.2 screws from the mini monster set these were 2.2 headless screws were placed across the osteotomy there is checked forcing good position and correction were noted.  Procedure #3: At this time attention directed to the lateral aspect of this incision the extensor tendon was then retracted medially from the second metatarsal and soft tissue overlying the third metatarsal distal shaft and head was identified. The extensor tendon was identified and extensor hood release was performed going to the third toe. The tendon  was tracked and laterally and incision made through the Periosteum Freeing Mediolaterally from the Metatarsal Head. This Point a Similar Procedure Was Performed As That on the Second Metatarsal. All Areas Checked FluoroScan Good Position Correction Fixation Were Noted to the Surgical Sites. There Is an Copiously Irrigated and Capsule Tissue Was Closed to Both These Areas with 4 Vicryl in Continuous Stitch Deep Superficial Fascial Layers Were Closed with 4 Vicryl continuous subcutaneous stitch and skin was closed with 4 Vicryl in a subcuticular stitch.  At this time all areas were blocked with 15 cc of X Prell as noted above.  Tourniquet been released at 90 minutes 4 proximal 2025 minutes and reinflated for 40 minutes.    Patient tolerated the procedure and anesthesia well.  Was transported from the OR to the PACU with all vital signs stable and vascular status intact. To be discharged per routine protocol.  Will follow up in approximately 1 week in the outpatient clinic.

## 2016-10-16 ENCOUNTER — Emergency Department (HOSPITAL_COMMUNITY): Payer: 59

## 2016-10-16 ENCOUNTER — Inpatient Hospital Stay (HOSPITAL_COMMUNITY)
Admission: EM | Admit: 2016-10-16 | Discharge: 2016-10-18 | DRG: 065 | Disposition: A | Discharge status: Home / Self Care | Payer: 59 | Attending: Family Medicine | Admitting: Family Medicine

## 2016-10-16 ENCOUNTER — Encounter (HOSPITAL_COMMUNITY): Payer: Self-pay | Admitting: Emergency Medicine

## 2016-10-16 DIAGNOSIS — K219 Gastro-esophageal reflux disease without esophagitis: Secondary | ICD-10-CM | POA: Diagnosis not present

## 2016-10-16 DIAGNOSIS — R402432 Glasgow coma scale score 3-8, at arrival to emergency department: Secondary | ICD-10-CM | POA: Diagnosis present

## 2016-10-16 DIAGNOSIS — F39 Unspecified mood [affective] disorder: Secondary | ICD-10-CM | POA: Diagnosis present

## 2016-10-16 DIAGNOSIS — Z6841 Body Mass Index (BMI) 40.0 and over, adult: Secondary | ICD-10-CM

## 2016-10-16 DIAGNOSIS — Z9289 Personal history of other medical treatment: Secondary | ICD-10-CM

## 2016-10-16 DIAGNOSIS — R569 Unspecified convulsions: Secondary | ICD-10-CM | POA: Diagnosis present

## 2016-10-16 DIAGNOSIS — R404 Transient alteration of awareness: Secondary | ICD-10-CM

## 2016-10-16 DIAGNOSIS — R40243 Glasgow coma scale score 3-8, unspecified time: Secondary | ICD-10-CM | POA: Diagnosis not present

## 2016-10-16 DIAGNOSIS — R4189 Other symptoms and signs involving cognitive functions and awareness: Secondary | ICD-10-CM

## 2016-10-16 DIAGNOSIS — R4701 Aphasia: Secondary | ICD-10-CM | POA: Diagnosis present

## 2016-10-16 DIAGNOSIS — Z8669 Personal history of other diseases of the nervous system and sense organs: Secondary | ICD-10-CM

## 2016-10-16 DIAGNOSIS — Z880 Allergy status to penicillin: Secondary | ICD-10-CM

## 2016-10-16 DIAGNOSIS — Z8673 Personal history of transient ischemic attack (TIA), and cerebral infarction without residual deficits: Secondary | ICD-10-CM

## 2016-10-16 DIAGNOSIS — E669 Obesity, unspecified: Secondary | ICD-10-CM | POA: Diagnosis present

## 2016-10-16 DIAGNOSIS — Z882 Allergy status to sulfonamides status: Secondary | ICD-10-CM

## 2016-10-16 DIAGNOSIS — I639 Cerebral infarction, unspecified: Secondary | ICD-10-CM | POA: Diagnosis not present

## 2016-10-16 DIAGNOSIS — R29726 NIHSS score 26: Secondary | ICD-10-CM | POA: Diagnosis present

## 2016-10-16 DIAGNOSIS — G8191 Hemiplegia, unspecified affecting right dominant side: Secondary | ICD-10-CM | POA: Diagnosis present

## 2016-10-16 DIAGNOSIS — M1991 Primary osteoarthritis, unspecified site: Secondary | ICD-10-CM | POA: Diagnosis present

## 2016-10-16 DIAGNOSIS — R531 Weakness: Secondary | ICD-10-CM | POA: Diagnosis not present

## 2016-10-16 DIAGNOSIS — Z79899 Other long term (current) drug therapy: Secondary | ICD-10-CM

## 2016-10-16 DIAGNOSIS — F329 Major depressive disorder, single episode, unspecified: Secondary | ICD-10-CM | POA: Diagnosis present

## 2016-10-16 LAB — COMPREHENSIVE METABOLIC PANEL
ALK PHOS: 75 U/L (ref 38–126)
ALT: 23 U/L (ref 14–54)
AST: 26 U/L (ref 15–41)
Albumin: 3.4 g/dL — ABNORMAL LOW (ref 3.5–5.0)
Anion gap: 8 (ref 5–15)
BILIRUBIN TOTAL: 0.2 mg/dL — AB (ref 0.3–1.2)
BUN: 21 mg/dL — ABNORMAL HIGH (ref 6–20)
CO2: 24 mmol/L (ref 22–32)
CREATININE: 0.9 mg/dL (ref 0.44–1.00)
Calcium: 8.8 mg/dL — ABNORMAL LOW (ref 8.9–10.3)
Chloride: 101 mmol/L (ref 101–111)
Glucose, Bld: 109 mg/dL — ABNORMAL HIGH (ref 65–99)
Potassium: 3.9 mmol/L (ref 3.5–5.1)
SODIUM: 133 mmol/L — AB (ref 135–145)
TOTAL PROTEIN: 6.5 g/dL (ref 6.5–8.1)

## 2016-10-16 LAB — I-STAT CHEM 8, ED
BUN: 26 mg/dL — AB (ref 6–20)
CALCIUM ION: 1.1 mmol/L — AB (ref 1.15–1.40)
CHLORIDE: 102 mmol/L (ref 101–111)
Creatinine, Ser: 0.9 mg/dL (ref 0.44–1.00)
Glucose, Bld: 109 mg/dL — ABNORMAL HIGH (ref 65–99)
HCT: 35 % — ABNORMAL LOW (ref 36.0–46.0)
Hemoglobin: 11.9 g/dL — ABNORMAL LOW (ref 12.0–15.0)
Potassium: 4.9 mmol/L (ref 3.5–5.1)
SODIUM: 135 mmol/L (ref 135–145)
TCO2: 25 mmol/L (ref 0–100)

## 2016-10-16 LAB — CBC
HCT: 34 % — ABNORMAL LOW (ref 36.0–46.0)
HEMOGLOBIN: 11.2 g/dL — AB (ref 12.0–15.0)
MCH: 27.7 pg (ref 26.0–34.0)
MCHC: 32.9 g/dL (ref 30.0–36.0)
MCV: 84.2 fL (ref 78.0–100.0)
Platelets: 274 10*3/uL (ref 150–400)
RBC: 4.04 MIL/uL (ref 3.87–5.11)
RDW: 13.5 % (ref 11.5–15.5)
WBC: 9.7 10*3/uL (ref 4.0–10.5)

## 2016-10-16 LAB — PROTIME-INR
INR: 1.04
Prothrombin Time: 13.6 seconds (ref 11.4–15.2)

## 2016-10-16 LAB — CBG MONITORING, ED: Glucose-Capillary: 107 mg/dL — ABNORMAL HIGH (ref 65–99)

## 2016-10-16 LAB — DIFFERENTIAL
Basophils Absolute: 0 10*3/uL (ref 0.0–0.1)
Basophils Relative: 0 %
EOS PCT: 2 %
Eosinophils Absolute: 0.2 10*3/uL (ref 0.0–0.7)
LYMPHS PCT: 28 %
Lymphs Abs: 2.7 10*3/uL (ref 0.7–4.0)
MONO ABS: 1 10*3/uL (ref 0.1–1.0)
MONOS PCT: 11 %
NEUTROS ABS: 5.7 10*3/uL (ref 1.7–7.7)
Neutrophils Relative %: 59 %

## 2016-10-16 LAB — APTT: aPTT: 31 seconds (ref 24–36)

## 2016-10-16 LAB — I-STAT BETA HCG BLOOD, ED (MC, WL, AP ONLY): I-stat hCG, quantitative: <5 m[IU]/mL (ref <5)

## 2016-10-16 LAB — I-STAT TROPONIN, ED: Troponin i, poc: 0 ng/mL (ref 0.00–0.08)

## 2016-10-16 LAB — ETHANOL: Alcohol, Ethyl (B): <5 mg/dL (ref <5)

## 2016-10-16 LAB — TRIGLYCERIDES: Triglycerides: 98 mg/dL (ref <150)

## 2016-10-16 MED ORDER — NALOXONE HCL 2 MG/2ML IJ SOSY
1.0000 mg | PREFILLED_SYRINGE | Freq: Once | INTRAMUSCULAR | Status: AC
  Administered 2016-10-16: 1 mg via INTRAVENOUS

## 2016-10-16 MED ORDER — LORAZEPAM 2 MG/ML IJ SOLN
4.0000 mg | Freq: Once | INTRAMUSCULAR | Status: AC
  Administered 2016-10-16: 4 mg via INTRAVENOUS

## 2016-10-16 MED ORDER — MORPHINE SULFATE (PF) 4 MG/ML IV SOLN
4.0000 mg | Freq: Once | INTRAVENOUS | Status: AC
  Administered 2016-10-16: 4 mg via INTRAVENOUS

## 2016-10-16 MED ORDER — MORPHINE SULFATE (PF) 4 MG/ML IV SOLN
INTRAVENOUS | Status: AC
  Filled 2016-10-16: qty 1

## 2016-10-16 MED ORDER — ONDANSETRON HCL 4 MG/2ML IJ SOLN
4.0000 mg | Freq: Once | INTRAMUSCULAR | Status: AC
  Administered 2016-10-16: 4 mg via INTRAVENOUS

## 2016-10-16 MED ORDER — PROPOFOL 1000 MG/100ML IV EMUL
0.0000 ug/kg/min | INTRAVENOUS | Status: DC
  Administered 2016-10-16: 20:00:00 via INTRAVENOUS

## 2016-10-16 MED ORDER — ONDANSETRON HCL 4 MG/2ML IJ SOLN
INTRAMUSCULAR | Status: AC
  Filled 2016-10-16: qty 2

## 2016-10-16 MED ORDER — AMMONIA AROMATIC IN INHA
RESPIRATORY_TRACT | Status: AC
  Administered 2016-10-16: 20:00:00
  Filled 2016-10-16: qty 10

## 2016-10-16 MED ORDER — ETOMIDATE 2 MG/ML IV SOLN
20.0000 mg | Freq: Once | INTRAVENOUS | Status: AC
  Administered 2016-10-16: 20 mg via INTRAVENOUS

## 2016-10-16 MED ORDER — SODIUM CHLORIDE 0.9 % IV SOLN
1000.0000 mg | Freq: Once | INTRAVENOUS | Status: AC
  Administered 2016-10-16: 1000 mg via INTRAVENOUS
  Filled 2016-10-16: qty 10

## 2016-10-16 MED ORDER — SODIUM CHLORIDE 0.9 % IV BOLUS (SEPSIS)
500.0000 mL | Freq: Once | INTRAVENOUS | Status: AC
Start: 2016-10-16 — End: 2016-10-16
  Administered 2016-10-16: 500 mL via INTRAVENOUS

## 2016-10-16 MED ORDER — LORAZEPAM 2 MG/ML IJ SOLN
INTRAMUSCULAR | Status: AC
  Filled 2016-10-16: qty 2

## 2016-10-16 MED ORDER — SUCCINYLCHOLINE CHLORIDE 20 MG/ML IJ SOLN
100.0000 mg | Freq: Once | INTRAMUSCULAR | Status: AC
Start: 2016-10-16 — End: 2016-10-16
  Administered 2016-10-16: 100 mg via INTRAVENOUS
  Filled 2016-10-16: qty 5

## 2016-10-16 MED ORDER — PROPOFOL 1000 MG/100ML IV EMUL: 0.0000 ug/kg/min | INTRAVENOUS | Status: DC

## 2016-10-16 MED ORDER — PROPOFOL 1000 MG/100ML IV EMUL
INTRAVENOUS | Status: AC
  Filled 2016-10-16: qty 100

## 2016-10-16 MED ORDER — IOPAMIDOL (ISOVUE-370) INJECTION 76%
50.0000 mL | Freq: Once | INTRAVENOUS | Status: AC | PRN
  Administered 2016-10-16: 50 mL via INTRAVENOUS

## 2016-10-16 NOTE — ED Notes (Signed)
Complete bed linen / hospital gown change , repositioned for comfort .

## 2016-10-16 NOTE — ED Notes (Signed)
Patient transported to CT scan . 

## 2016-10-16 NOTE — H&P (Signed)
History and Physical    Gwendolyn Torres WJX:[id number] DOB: 1970/07/09 DOA: 10/16/2016  PCP: Gwendolyn Pheasant, MD   Patient coming from: Home via EMS  Chief Complaint: Right sided facial droop and weakness  HPI: Gwendolyn Torres is a 46 y.o. woman with a history of GERD (on prilosec 20mg  daily) and mood disturbance (she is on Wellbutrin) with a recent right ankle surgery approximately three weeks ago (she has strict NONweightbearing orders from ortho per her husband) who was in her baseline state of health until she developed right sided facial droop then right sided weakness while having dinner with her husband.  He called 911 immediately.  The patient was transported as a Code Stroke.  While en route, the patient became unresponsive, and she had not received any medications from EMS.  Upon arrival here, she was posturing and was found to have unequal pupils.  She vomited undigested food.  She was intubated for airway protection.  STAT head CT was negative for acute process.  She eventually received 1mg  of IV narcan, and she seemed to return to baseline.  She was extubated.  At the time of my assessment, she recalls feeling dizzy and anxious prior to the onset of her symptoms at home.  No chest pain, shortness of breath, nausea, vomiting, LUTS, diarrhea, or fever.  No recent issues with disequilibrium.  She has been using a knee scooter to get around.  No tick exposure.  Appetite has been normal.  ED Course: Head CT negative.  EtOH level negative.  Pregnancy test negative.  BUN 21.  Creatinine 0.9.  Troponin negative.  EKG shows sinus tachycardia.  Chest xray concerning for right suprahilar/mediastinal opacity.  CTA of head and neck also done.  Upper lung fields partially visualized on this study and now acute findings were seen.   The patient was loaded with Keppra 1000mg  IV x one.  Hospitalist asked to admit.   Review of Systems: As per HPI otherwise 10 systems reviewed and negative.   Past Medical  History:  Diagnosis Date  . GERD (gastroesophageal reflux disease)     Past Surgical History:  Procedure Laterality Date  . BUNIONECTOMY Right 09/25/2016   Procedure: lapidus type  modified mcbride;  Surgeon: Gwendolyn Torres, DPM;  Location: [facility];  Service: Podiatry;  Laterality: Right;  . CESAREAN SECTION  2004  . OSTECTOMY Right 09/25/2016   Procedure: OSTECTOMY RT  1SR,2ND 3RD  metatarsal;  Surgeon: Gwendolyn Torres, DPM;  Location: [facility];  Service: Podiatry;  Laterality: Right;  LMA LOCAL     reports that she has never smoked. She has never used smokeless tobacco. She reports that she does not drink alcohol or use drugs.  Allergies  Allergen Reactions  . Penicillins Rash  . Sulfa Antibiotics Rash    Family History  Problem Relation Age of Onset  . Aortic aneurysm Father        also has popliteal aneurysm  . Hypertension Father   . Hypercholesterolemia Father   . Diabetes Father   . Aortic aneurysm Mother   . Hypercholesterolemia Mother   . Hypercholesterolemia Sister        x2  . Hyperlipidemia Brother   . Hypertension Sister   . Breast cancer Maternal Aunt   . Melanoma Maternal Uncle   . Colon cancer Neg Hx      Prior to Admission medications   Medication Sig Start Date End Date Taking? Authorizing Provider  buPROPion (WELLBUTRIN XL) 300 MG 24  hr tablet TAKE 1 TABLET (300 MG TOTAL) BY MOUTH DAILY. 05/23/16   Gwendolyn Pheasant, MD  HYDROcodone-acetaminophen (NORCO) 7.5-325 MG tablet Take 1 tablet by mouth every 6 (six) hours as needed for moderate pain. 09/25/16   Troxler, Gwendolyn Torres, DPM  ibuprofen (ADVIL,MOTRIN) 200 MG tablet Take 200 mg by mouth as needed for headache.     [provider]  omeprazole (PRILOSEC) 20 MG capsule Take 20 mg by mouth daily.    [provider]    Physical Exam: Vitals:   10/16/16 2215 10/16/16 2230 10/16/16 2300 10/16/16 2315  BP: 115/63 131/63 (!) 97/57 (!) 98/57  Pulse: (!) 115 (!) 117 (!)  108 (!) 113  Resp: 15 20 17 15   Temp:      TempSrc:      SpO2: 95% 95% 91% 92%  Weight:      Height:          Constitutional: NAD, calm, upset by events of tonight but currently stable. Vitals:   10/16/16 2215 10/16/16 2230 10/16/16 2300 10/16/16 2315  BP: 115/63 131/63 (!) 97/57 (!) 98/57  Pulse: (!) 115 (!) 117 (!) 108 (!) 113  Resp: 15 20 17 15   Temp:      TempSrc:      SpO2: 95% 95% 91% 92%  Weight:      Height:       Eyes: PERRL, lids and conjunctivae normal ENMT: Mucous membranes are moist. Posterior pharynx clear of any exudate or lesions. Normal dentition.  Neck: normal appearance, supple, no masses Respiratory: clear to auscultation bilaterally, no wheezing, no crackles. Normal respiratory effort. No accessory muscle use.  Cardiovascular: Normal rate, regular rhythm, no murmurs / rubs / gallops. No extremity edema. 2+ pedal pulses. No carotid bruits.  GI: abdomen is soft and compressible.  No distention.  No tenderness.  Bowel sounds are present. Musculoskeletal:  No joint deformity in upper and lower extremities though right foot and ankle are in a boot. Good ROM in all other extremities except for right leg.  No contractures. Normal muscle tone.  Skin: no rashes, warm and dry Neurologic: CN 2-12 grossly intact. Sensation intact, Strength symmetric bilaterally, 5/5.  No pronator drift. Psychiatric: Normal judgment and insight. Alert and oriented x 3. Mood appropriate.    Labs on Admission: I have personally reviewed following labs and imaging studies  CBC:  Recent Labs Lab 10/16/16 2011 10/16/16 2038  WBC 9.7  --   NEUTROABS 5.7  --   HGB 11.2* 11.9*  HCT 34.0* 35.0*  MCV 84.2  --   PLT 274  --    Basic Metabolic Panel:  Recent Labs Lab 10/16/16 2011 10/16/16 2038  NA 133* 135  K 3.9 4.9  CL 101 102  CO2 24  --   GLUCOSE 109* 109*  BUN 21* 26*  CREATININE 0.90 0.90  CALCIUM 8.8*  --    GFR: Estimated Creatinine Clearance: 106.7 mL/min (by  C-G formula based on SCr of 0.9 mg/dL). Liver Function Tests:  Recent Labs Lab 10/16/16 2011  AST 26  ALT 23  ALKPHOS 75  BILITOT 0.2*  PROT 6.5  ALBUMIN 3.4*   Coagulation Profile:  Recent Labs Lab 10/16/16 2011  INR 1.04   CBG:  Recent Labs Lab 10/16/16 2013  GLUCAP 107*   Lipid Profile:  Recent Labs  10/16/16 2025  TRIG 98    Radiological Exams on Admission: Ct Angio Head W Or Wo Contrast  Result Date: 10/16/2016 CLINICAL DATA:  Initial  evaluation for acute stroke. EXAM: CT ANGIOGRAPHY HEAD AND NECK TECHNIQUE: Multidetector CT imaging of the head and neck was performed using the standard protocol during bolus administration of intravenous contrast. Multiplanar CT image reconstructions and MIPs were obtained to evaluate the vascular anatomy. Carotid stenosis measurements (when applicable) are obtained utilizing NASCET criteria, using the distal internal carotid diameter as the denominator. CONTRAST:  50 cc of Isovue 370. COMPARISON:  Prior head CT from earlier the same day. FINDINGS: CTA NECK FINDINGS Aortic arch: Visualized aortic arch of normal caliber with normal branch pattern. No high-grade stenosis about the origin of the great vessels. Visualized subclavian artery is widely patent. Right carotid system: Right common carotid artery patent from its origin to the bifurcation. No significant atheromatous plaque about the right bifurcation. Right ICA widely patent in the bifurcation to the skullbase without stenosis, dissection, or occlusion. Left carotid system: Left common carotid artery patent from its origin to the bifurcation without stenosis. No significant atheromatous narrowing about the left bifurcation. Left ICA widely patent from the bifurcation to the skullbase without stenosis, dissection, or occlusion. Vertebral arteries: Both of the vertebral arteries arise from the subclavian arteries. Left vertebral artery slightly dominant. Both of the vertebral arteries  widely patent within the neck without stenosis, dissection, or occlusion. Skeleton: No acute osseus abnormality. No worrisome lytic or blastic osseous lesions. Mild degenerate spondylolysis noted at C5-6. Other neck: No acute soft tissue abnormality within the neck. Endotracheal tube in place. Thyroid normal. No adenopathy. Upper chest: Atelectatic changes present within the partially visualized lungs, right greater than left. Remainder the visualized lungs are otherwise clear. Review of the MIP images confirms the above findings CTA HEAD FINDINGS Anterior circulation: Petrous segments grossly patent, although evaluation limited by timing of the contrast bolus. Cavernous and supraclinoid ICA is patent without flow-limiting stenosis. ICA termini widely patent. A1 segments patent. Right A1 segment slightly hypoplastic. Anterior communicating artery not well seen. Left anterior cerebral artery patent to its distal aspect. Right anterior cerebral artery hypoplastic and not well visualize, but felt to likely be patent to its distal aspect is well. M1 segments patent without stenosis or occlusion. No proximal M2 occlusion distal MCA branches well opacified and symmetric. Posterior circulation: Vertebral artery is patent to the vertebrobasilar junction without stenosis. Posterior inferior cerebral arteries patent proximally. Basilar artery widely patent. Superior cerebral arteries patent bilaterally. Both of the posterior cerebral artery supplied via the basilar artery and are patent to their distal aspects. Venous sinuses: Patent. Anatomic variants: No significant anatomic variant. No aneurysm or vascular malformation. Delayed phase: Not performed. Review of the MIP images confirms the above findings IMPRESSION: 1. Negative CTA for emergent large vessel occlusion. No high-grade or correctable stenosis identified. 2. Endotracheal tube in place with atelectatic changes within the visualized lungs, right greater than left.  Dr. Nicole Kindred of the Stroke Neurology service has been paged regarding these results at approximately 9:26 p.m. on 10/16/2016. Currently awaiting a call back. Addendum will be made once results were conveyed. Electronically Signed   By: Jeannine Boga M.D.   On: 10/16/2016 21:32   Ct Angio Neck W Or Wo Contrast  Result Date: 10/16/2016 CLINICAL DATA:  Initial evaluation for acute stroke. EXAM: CT ANGIOGRAPHY HEAD AND NECK TECHNIQUE: Multidetector CT imaging of the head and neck was performed using the standard protocol during bolus administration of intravenous contrast. Multiplanar CT image reconstructions and MIPs were obtained to evaluate the vascular anatomy. Carotid stenosis measurements (when applicable) are obtained utilizing NASCET  criteria, using the distal internal carotid diameter as the denominator. CONTRAST:  50 cc of Isovue 370. COMPARISON:  Prior head CT from earlier the same day. FINDINGS: CTA NECK FINDINGS Aortic arch: Visualized aortic arch of normal caliber with normal branch pattern. No high-grade stenosis about the origin of the great vessels. Visualized subclavian artery is widely patent. Right carotid system: Right common carotid artery patent from its origin to the bifurcation. No significant atheromatous plaque about the right bifurcation. Right ICA widely patent in the bifurcation to the skullbase without stenosis, dissection, or occlusion. Left carotid system: Left common carotid artery patent from its origin to the bifurcation without stenosis. No significant atheromatous narrowing about the left bifurcation. Left ICA widely patent from the bifurcation to the skullbase without stenosis, dissection, or occlusion. Vertebral arteries: Both of the vertebral arteries arise from the subclavian arteries. Left vertebral artery slightly dominant. Both of the vertebral arteries widely patent within the neck without stenosis, dissection, or occlusion. Skeleton: No acute osseus abnormality.  No worrisome lytic or blastic osseous lesions. Mild degenerate spondylolysis noted at C5-6. Other neck: No acute soft tissue abnormality within the neck. Endotracheal tube in place. Thyroid normal. No adenopathy. Upper chest: Atelectatic changes present within the partially visualized lungs, right greater than left. Remainder the visualized lungs are otherwise clear. Review of the MIP images confirms the above findings CTA HEAD FINDINGS Anterior circulation: Petrous segments grossly patent, although evaluation limited by timing of the contrast bolus. Cavernous and supraclinoid ICA is patent without flow-limiting stenosis. ICA termini widely patent. A1 segments patent. Right A1 segment slightly hypoplastic. Anterior communicating artery not well seen. Left anterior cerebral artery patent to its distal aspect. Right anterior cerebral artery hypoplastic and not well visualize, but felt to likely be patent to its distal aspect is well. M1 segments patent without stenosis or occlusion. No proximal M2 occlusion distal MCA branches well opacified and symmetric. Posterior circulation: Vertebral artery is patent to the vertebrobasilar junction without stenosis. Posterior inferior cerebral arteries patent proximally. Basilar artery widely patent. Superior cerebral arteries patent bilaterally. Both of the posterior cerebral artery supplied via the basilar artery and are patent to their distal aspects. Venous sinuses: Patent. Anatomic variants: No significant anatomic variant. No aneurysm or vascular malformation. Delayed phase: Not performed. Review of the MIP images confirms the above findings IMPRESSION: 1. Negative CTA for emergent large vessel occlusion. No high-grade or correctable stenosis identified. 2. Endotracheal tube in place with atelectatic changes within the visualized lungs, right greater than left. Dr. Nicole Kindred of the Stroke Neurology service has been paged regarding these results at approximately 9:26 p.m. on  10/16/2016. Currently awaiting a call back. Addendum will be made once results were conveyed. Electronically Signed   By: Jeannine Boga M.D.   On: 10/16/2016 21:32   Dg Chest Port 1 View  Result Date: 10/16/2016 CLINICAL DATA:  ETT EXAM: PORTABLE CHEST 1 VIEW COMPARISON:  None. FINDINGS: Endotracheal tube tip is approximately 2.2 cm superior to the carina. There are low lung volumes. The heart size is upper limits of normal. Prominent upper mediastinum with vague opacity in the right suprahilar region. No pneumothorax. IMPRESSION: 1. Endotracheal tube tip about 2.2 cm superior to carina 2. Low lung volumes. Vague right suprahilar and paramediastinal opacity, cannot exclude infiltrate or mass. Consider CT for further evaluation. Electronically Signed   By: Donavan Foil M.D.   On: 10/16/2016 20:39   Ct Head Code Stroke W/o Cm  Result Date: 10/16/2016 CLINICAL DATA:  Code stroke.  Initial evaluation for acute weakness, slurred speech. EXAM: CT HEAD WITHOUT CONTRAST TECHNIQUE: Contiguous axial images were obtained from the base of the skull through the vertex without intravenous contrast. COMPARISON:  None. FINDINGS: Brain: Cerebral volume normal. No acute intracranial hemorrhage. No evidence for acute large vessel territory infarct. No mass lesion, midline shift or mass effect. No hydrocephalus. No extra-axial fluid collection. Vascular: No hyperdense vessel. Skull: Scalp soft tissues and calvarium within normal limits. Sinuses/Orbits: Globes and orbital soft tissues normal. Visualized paranasal sinuses and mastoids are clear. ASPECTS [facility] Stroke Program Early CT Score) - Ganglionic level infarction (caudate, lentiform nuclei, internal capsule, insula, M1-M3 cortex): 7 - Supraganglionic infarction (M4-M6 cortex): 3 Total score (0-10 with 10 being normal): 10 IMPRESSION: 1. No acute intracranial infarct or other process identified. 2. ASPECTS is 10 Critical Value/emergent results were called by  telephone at the time of interpretation on 10/16/2016 at 9:07 pm to Dr. Nicole Kindred, who verbally acknowledged these results. Electronically Signed   By: Jeannine Boga M.D.   On: 10/16/2016 21:08    EKG: Independently reviewed. Sinus tachycardia.  Probable LVH.  Assessment/Plan Principal Problem:   Seizure [facility]) Active Problems:   Right sided weakness   Unresponsive episode      Right sided facial droop and weakness with an episode of unresponsiveness concerning for seizure. --Neurology consultation appreciated --MRI brain with and without contrast --EEG in the AM --Fall, seizure, aspiration precautions. --Keppra 500mg  IV q12h --Complete echo --Hypercoag panel --A1c and lipid panel --Baby aspirin daily  GERD --Protonix  Recent ankle surgery --Right leg and foot in boot --NONweightbearing status  DVT prophylaxis: SCD to left leg.  Will not order anticoagulant until MRI is done. Code Status: FULL Family Communication: Multiple family members including husband and daughter present in the ED at time of admission. Disposition Plan: To be determined. Consults called: Neurology Admission status: Place in observation, stepdown unit.   TIME SPENT: 70 minutes   Eber Jones MD [facility] Pager [phone]  If 7PM-7AM, please contact night-coverage [email or web address] Password [facility]  10/16/2016, 11:55 PM

## 2016-10-16 NOTE — Progress Notes (Addendum)
Gwendolyn Torres is a 47 yo female presents to ED via [facility] from home with acute onset of Right side weakness, facial droop, and slurred speech. Per EMS her symptoms started during dinner. Upon EMS arrival pt was alert and following commands, but became unresponsive en route. Pt recently had Left ankle surgery. History of GERD. Airway assessed at the bridge by [facility] EDP, pt intubated prior to going to CT scan for airway protection. CT scan and CTA completed negative for occlusion. Narcan 1 mg given per Dr. Nicole Kindred request, quickly awakened, following commands, pt extubated. Code stroke cancelled. To be admitted by [facility]  LKW 1910, CBG 107, NIH 26

## 2016-10-16 NOTE — Consult Note (Addendum)
Admission H&P Referring physician: Dr Kathrynn Humble    Chief Complaint: Altered mental status with progression to unresponsiveness.  HPI: Gwendolyn Torres is an 46 y.o. female with a history of depression and GERD, brought to the ED and code stroke status following acute onset of lightheadedness, reported facial droop on the right and right-sided weakness which rapidly progressed to unresponsiveness with intermittent episodes of stiffening of upper and lower extremities bilaterally. She complained of double vision along with lightheadedness prior to becoming unresponsive. Blood sugar was 109. She has no history of syncope nor seizures. On arrival in the ED she was unresponsive except to noxious stimuli. Left pupil was 7 mm and right pupil was 4 mm, and neither reacted to light. Patient was electively intubated for airway protection. She was noted to have episodes of cerebral posturing prior to and after intubation. CT scan of her head was obtained which showed no acute intracranial abnormality. CT angiogram of her head and neck was also obtained which was also unremarkable. Following CT scan and CT angiogram patient was given 1 mg of Narcan and became progressively more responsive. She began to open eyes as well as follow commands. Movements of extremities were normal and symmetrical. She was subsequently extubated. She became completely alert and coherent with no focal deficits. Code stroke was canceled.  LSW: 7:10 PM on 10/16/2016 TPA: No: No clear focal deficits while unresponsive, no deficits after regaining consciousness.  Past Medical History:  Diagnosis Date  . GERD (gastroesophageal reflux disease)     Past Surgical History:  Procedure Laterality Date  . BUNIONECTOMY Right 09/25/2016   Procedure: lapidus type  modified mcbride;  Surgeon: Albertine Patricia, DPM;  Location: [facility];  Service: Podiatry;  Laterality: Right;  . CESAREAN SECTION  2004  . OSTECTOMY Right 09/25/2016   Procedure:  OSTECTOMY RT  1SR,2ND 3RD  metatarsal;  Surgeon: Albertine Patricia, DPM;  Location: [facility];  Service: Podiatry;  Laterality: Right;  LMA LOCAL    Family History  Problem Relation Age of Onset  . Aortic aneurysm Father        also has popliteal aneurysm  . Hypertension Father   . Hypercholesterolemia Father   . Diabetes Father   . Aortic aneurysm Mother   . Hypercholesterolemia Mother   . Hypercholesterolemia Sister        x2  . Hyperlipidemia Brother   . Hypertension Sister   . Breast cancer Maternal Aunt   . Melanoma Maternal Uncle   . Colon cancer Neg Hx    Social History:  reports that she has never smoked. She has never used smokeless tobacco. She reports that she does not drink alcohol or use drugs.  Allergies:  Allergies  Allergen Reactions  . Penicillins Rash  . Sulfa Antibiotics Rash    Medications: Preadmission medications were reviewed by me.  ROS: History obtained from spouse  General ROS: negative for - chills, fatigue, fever, night sweats, weight gain or weight loss Psychological ROS: negative for - behavioral disorder, hallucinations, memory difficulties, mood swings or suicidal ideation Ophthalmic ROS: negative for - blurry vision, double vision, eye pain or loss of vision ENT ROS: negative for - epistaxis, nasal discharge, oral lesions, sore throat, tinnitus or vertigo Allergy and Immunology ROS: negative for - hives or itchy/watery eyes Hematological and Lymphatic ROS: negative for - bleeding problems, bruising or swollen lymph nodes Endocrine ROS: negative for - galactorrhea, hair pattern changes, polydipsia/polyuria or temperature intolerance Respiratory ROS: negative for - cough, hemoptysis,  shortness of breath or wheezing Cardiovascular ROS: negative for - chest pain, dyspnea on exertion, edema or irregular heartbeat Gastrointestinal ROS: negative for - abdominal pain, diarrhea, hematemesis, nausea/vomiting or stool  incontinence Genito-Urinary ROS: negative for - dysuria, hematuria, incontinence or urinary frequency/urgency Musculoskeletal ROS: negative for - joint swelling or muscular weakness Neurological ROS: as noted in HPI; recent right bunionectomy and complaint of numbness involving her right foot since surgery 3 weeks ago Dermatological ROS: negative for rash and skin lesion changes  Physical Examination: Blood pressure 111/60, pulse 96, temperature 98.6 F (37 C), resp. rate (!) 32, height '5\' 7"'  (1.702 m), weight 121.6 kg (268 lb), last menstrual period 09/20/2016, SpO2 100 %.  HEENT-  Normocephalic, no lesions, without obvious abnormality.  Normal external eye and conjunctiva.  Normal TM's bilaterally.  Normal auditory canals and external ears. Normal external nose, mucus membranes and septum.  Normal pharynx. Neck supple with no masses, nodes, nodules or enlargement. Cardiovascular - regular rate and rhythm, S1, S2 normal, no murmur, click, rub or gallop Lungs - chest clear, no wheezing, rales, normal symmetric air entry Abdomen - soft, non-tender; bowel sounds normal; no masses,  no organomegaly Extremities - no joint deformities, effusion, or inflammation  Neurologic Examination: Mental Status: Alert, oriented, no acute distress.  Speech fluent without evidence of aphasia. Able to follow commands without difficulty. Cranial Nerves: II-Visual fields were normal. III/IV/VI-Pupils were equal and reacted normally to light. Extraocular movements were full and conjugate.  She had slight diplopia with far gaze only. V/VII-no facial numbness and no facial weakness. VIII-normal. X-normal speech and symmetrical palatal movement. XII-midline tongue extension with normal strength. Motor: 5/5 bilaterally with normal tone and bulk Sensory: Normal throughout. Deep Tendon Reflexes: 2+ and symmetric. Plantars: Flexor on the left; splint on right leg and foot. Carotid auscultation: Normal  Results  for orders placed or performed during the hospital encounter of 10/16/16 (from the past 48 hour(s))  Ethanol     Status: None   Collection Time: 10/16/16  8:10 PM  Result Value Ref Range   Alcohol, Ethyl (B) <5 <5 mg/dL    Comment:        LOWEST DETECTABLE LIMIT FOR SERUM ALCOHOL IS 5 mg/dL FOR MEDICAL PURPOSES ONLY   Protime-INR     Status: None   Collection Time: 10/16/16  8:11 PM  Result Value Ref Range   Prothrombin Time 13.6 11.4 - 15.2 seconds   INR 1.04   APTT     Status: None   Collection Time: 10/16/16  8:11 PM  Result Value Ref Range   aPTT 31 24 - 36 seconds  CBC     Status: Abnormal   Collection Time: 10/16/16  8:11 PM  Result Value Ref Range   WBC 9.7 4.0 - 10.5 K/uL   RBC 4.04 3.87 - 5.11 MIL/uL   Hemoglobin 11.2 (L) 12.0 - 15.0 g/dL   HCT 34.0 (L) 36.0 - 46.0 %   MCV 84.2 78.0 - 100.0 fL   MCH 27.7 26.0 - 34.0 pg   MCHC 32.9 30.0 - 36.0 g/dL   RDW 13.5 11.5 - 15.5 %   Platelets 274 150 - 400 K/uL  Differential     Status: None   Collection Time: 10/16/16  8:11 PM  Result Value Ref Range   Neutrophils Relative % 59 %   Neutro Abs 5.7 1.7 - 7.7 K/uL   Lymphocytes Relative 28 %   Lymphs Abs 2.7 0.7 - 4.0 K/uL   Monocytes  Relative 11 %   Monocytes Absolute 1.0 0.1 - 1.0 K/uL   Eosinophils Relative 2 %   Eosinophils Absolute 0.2 0.0 - 0.7 K/uL   Basophils Relative 0 %   Basophils Absolute 0.0 0.0 - 0.1 K/uL  Comprehensive metabolic panel     Status: Abnormal   Collection Time: 10/16/16  8:11 PM  Result Value Ref Range   Sodium 133 (L) 135 - 145 mmol/L   Potassium 3.9 3.5 - 5.1 mmol/L   Chloride 101 101 - 111 mmol/L   CO2 24 22 - 32 mmol/L   Glucose, Bld 109 (H) 65 - 99 mg/dL   BUN 21 (H) 6 - 20 mg/dL   Creatinine, Ser 0.90 0.44 - 1.00 mg/dL   Calcium 8.8 (L) 8.9 - 10.3 mg/dL   Total Protein 6.5 6.5 - 8.1 g/dL   Albumin 3.4 (L) 3.5 - 5.0 g/dL   AST 26 15 - 41 U/L   ALT 23 14 - 54 U/L   Alkaline Phosphatase 75 38 - 126 U/L   Total Bilirubin 0.2 (L)  0.3 - 1.2 mg/dL   GFR calc non Af Amer >60 >60 mL/min   GFR calc Af Amer >60 >60 mL/min    Comment: (NOTE) The eGFR has been calculated using the CKD EPI equation. This calculation has not been validated in all clinical situations. eGFR's persistently <60 mL/min signify possible Chronic Kidney Disease.    Anion gap 8 5 - 15  CBG monitoring, ED     Status: Abnormal   Collection Time: 10/16/16  8:13 PM  Result Value Ref Range   Glucose-Capillary 107 (H) 65 - 99 mg/dL  Triglycerides     Status: None   Collection Time: 10/16/16  8:25 PM  Result Value Ref Range   Triglycerides 98 <150 mg/dL  I-stat troponin, ED (not at [facility], [facility])     Status: None   Collection Time: 10/16/16  8:36 PM  Result Value Ref Range   Troponin i, poc 0.00 0.00 - 0.08 ng/mL   Comment 3            Comment: Due to the release kinetics of cTnI, a negative result within the first hours of the onset of symptoms does not rule out myocardial infarction with certainty. If myocardial infarction is still suspected, repeat the test at appropriate intervals.   I-Stat Beta hCG blood, ED ([facility], [facility], [facility] only)     Status: None   Collection Time: 10/16/16  8:36 PM  Result Value Ref Range   I-stat hCG, quantitative <5.0 <5 mIU/mL   Comment 3            Comment:   GEST. AGE      CONC.  (mIU/mL)   <=1 WEEK        5 - 50     2 WEEKS       50 - 500     3 WEEKS       100 - 10,000     4 WEEKS     1,000 - 30,000        FEMALE AND NON-PREGNANT FEMALE:     LESS THAN 5 mIU/mL   I-Stat Chem 8, ED  (not at [facility], [facility])     Status: Abnormal   Collection Time: 10/16/16  8:38 PM  Result Value Ref Range   Sodium 135 135 - 145 mmol/L   Potassium 4.9 3.5 - 5.1 mmol/L   Chloride 102 101 - 111 mmol/L  BUN 26 (H) 6 - 20 mg/dL   Creatinine, Ser 0.90 0.44 - 1.00 mg/dL   Glucose, Bld 109 (H) 65 - 99 mg/dL   Calcium, Ion 1.10 (L) 1.15 - 1.40 mmol/L   TCO2 25 0 - 100 mmol/L   Hemoglobin 11.9 (L) 12.0 - 15.0 g/dL   HCT 35.0 (L) 36.0 - 46.0 %    Ct Angio Head W Or Wo Contrast  Result Date: 10/16/2016 CLINICAL DATA:  Initial evaluation for acute stroke. EXAM: CT ANGIOGRAPHY HEAD AND NECK TECHNIQUE: Multidetector CT imaging of the head and neck was performed using the standard protocol during bolus administration of intravenous contrast. Multiplanar CT image reconstructions and MIPs were obtained to evaluate the vascular anatomy. Carotid stenosis measurements (when applicable) are obtained utilizing NASCET criteria, using the distal internal carotid diameter as the denominator. CONTRAST:  50 cc of Isovue 370. COMPARISON:  Prior head CT from earlier the same day. FINDINGS: CTA NECK FINDINGS Aortic arch: Visualized aortic arch of normal caliber with normal branch pattern. No high-grade stenosis about the origin of the great vessels. Visualized subclavian artery is widely patent. Right carotid system: Right common carotid artery patent from its origin to the bifurcation. No significant atheromatous plaque about the right bifurcation. Right ICA widely patent in the bifurcation to the skullbase without stenosis, dissection, or occlusion. Left carotid system: Left common carotid artery patent from its origin to the bifurcation without stenosis. No significant atheromatous narrowing about the left bifurcation. Left ICA widely patent from the bifurcation to the skullbase without stenosis, dissection, or occlusion. Vertebral arteries: Both of the vertebral arteries arise from the subclavian arteries. Left vertebral artery slightly dominant. Both of the vertebral arteries widely patent within the neck without stenosis, dissection, or occlusion. Skeleton: No acute osseus abnormality. No worrisome lytic or blastic osseous lesions. Mild degenerate spondylolysis noted at C5-6. Other neck: No acute soft tissue abnormality within the neck. Endotracheal tube in place. Thyroid normal. No adenopathy. Upper chest: Atelectatic changes present within the partially visualized  lungs, right greater than left. Remainder the visualized lungs are otherwise clear. Review of the MIP images confirms the above findings CTA HEAD FINDINGS Anterior circulation: Petrous segments grossly patent, although evaluation limited by timing of the contrast bolus. Cavernous and supraclinoid ICA is patent without flow-limiting stenosis. ICA termini widely patent. A1 segments patent. Right A1 segment slightly hypoplastic. Anterior communicating artery not well seen. Left anterior cerebral artery patent to its distal aspect. Right anterior cerebral artery hypoplastic and not well visualize, but felt to likely be patent to its distal aspect is well. M1 segments patent without stenosis or occlusion. No proximal M2 occlusion distal MCA branches well opacified and symmetric. Posterior circulation: Vertebral artery is patent to the vertebrobasilar junction without stenosis. Posterior inferior cerebral arteries patent proximally. Basilar artery widely patent. Superior cerebral arteries patent bilaterally. Both of the posterior cerebral artery supplied via the basilar artery and are patent to their distal aspects. Venous sinuses: Patent. Anatomic variants: No significant anatomic variant. No aneurysm or vascular malformation. Delayed phase: Not performed. Review of the MIP images confirms the above findings IMPRESSION: 1. Negative CTA for emergent large vessel occlusion. No high-grade or correctable stenosis identified. 2. Endotracheal tube in place with atelectatic changes within the visualized lungs, right greater than left. Dr. Nicole Kindred of the Stroke Neurology service has been paged regarding these results at approximately 9:26 p.m. on 10/16/2016. Currently awaiting a call back. Addendum will be made once results were conveyed. Electronically Signed   By:  Jeannine Boga M.D.   On: 10/16/2016 21:32   Ct Angio Neck W Or Wo Contrast  Result Date: 10/16/2016 CLINICAL DATA:  Initial evaluation for acute stroke.  EXAM: CT ANGIOGRAPHY HEAD AND NECK TECHNIQUE: Multidetector CT imaging of the head and neck was performed using the standard protocol during bolus administration of intravenous contrast. Multiplanar CT image reconstructions and MIPs were obtained to evaluate the vascular anatomy. Carotid stenosis measurements (when applicable) are obtained utilizing NASCET criteria, using the distal internal carotid diameter as the denominator. CONTRAST:  50 cc of Isovue 370. COMPARISON:  Prior head CT from earlier the same day. FINDINGS: CTA NECK FINDINGS Aortic arch: Visualized aortic arch of normal caliber with normal branch pattern. No high-grade stenosis about the origin of the great vessels. Visualized subclavian artery is widely patent. Right carotid system: Right common carotid artery patent from its origin to the bifurcation. No significant atheromatous plaque about the right bifurcation. Right ICA widely patent in the bifurcation to the skullbase without stenosis, dissection, or occlusion. Left carotid system: Left common carotid artery patent from its origin to the bifurcation without stenosis. No significant atheromatous narrowing about the left bifurcation. Left ICA widely patent from the bifurcation to the skullbase without stenosis, dissection, or occlusion. Vertebral arteries: Both of the vertebral arteries arise from the subclavian arteries. Left vertebral artery slightly dominant. Both of the vertebral arteries widely patent within the neck without stenosis, dissection, or occlusion. Skeleton: No acute osseus abnormality. No worrisome lytic or blastic osseous lesions. Mild degenerate spondylolysis noted at C5-6. Other neck: No acute soft tissue abnormality within the neck. Endotracheal tube in place. Thyroid normal. No adenopathy. Upper chest: Atelectatic changes present within the partially visualized lungs, right greater than left. Remainder the visualized lungs are otherwise clear. Review of the MIP images  confirms the above findings CTA HEAD FINDINGS Anterior circulation: Petrous segments grossly patent, although evaluation limited by timing of the contrast bolus. Cavernous and supraclinoid ICA is patent without flow-limiting stenosis. ICA termini widely patent. A1 segments patent. Right A1 segment slightly hypoplastic. Anterior communicating artery not well seen. Left anterior cerebral artery patent to its distal aspect. Right anterior cerebral artery hypoplastic and not well visualize, but felt to likely be patent to its distal aspect is well. M1 segments patent without stenosis or occlusion. No proximal M2 occlusion distal MCA branches well opacified and symmetric. Posterior circulation: Vertebral artery is patent to the vertebrobasilar junction without stenosis. Posterior inferior cerebral arteries patent proximally. Basilar artery widely patent. Superior cerebral arteries patent bilaterally. Both of the posterior cerebral artery supplied via the basilar artery and are patent to their distal aspects. Venous sinuses: Patent. Anatomic variants: No significant anatomic variant. No aneurysm or vascular malformation. Delayed phase: Not performed. Review of the MIP images confirms the above findings IMPRESSION: 1. Negative CTA for emergent large vessel occlusion. No high-grade or correctable stenosis identified. 2. Endotracheal tube in place with atelectatic changes within the visualized lungs, right greater than left. Dr. Nicole Kindred of the Stroke Neurology service has been paged regarding these results at approximately 9:26 p.m. on 10/16/2016. Currently awaiting a call back. Addendum will be made once results were conveyed. Electronically Signed   By: Jeannine Boga M.D.   On: 10/16/2016 21:32   Dg Chest Port 1 View  Result Date: 10/16/2016 CLINICAL DATA:  ETT EXAM: PORTABLE CHEST 1 VIEW COMPARISON:  None. FINDINGS: Endotracheal tube tip is approximately 2.2 cm superior to the carina. There are low lung  volumes. The heart size  is upper limits of normal. Prominent upper mediastinum with vague opacity in the right suprahilar region. No pneumothorax. IMPRESSION: 1. Endotracheal tube tip about 2.2 cm superior to carina 2. Low lung volumes. Vague right suprahilar and paramediastinal opacity, cannot exclude infiltrate or mass. Consider CT for further evaluation. Electronically Signed   By: Donavan Foil M.D.   On: 10/16/2016 20:39   Ct Head Code Stroke W/o Cm  Result Date: 10/16/2016 CLINICAL DATA:  Code stroke. Initial evaluation for acute weakness, slurred speech. EXAM: CT HEAD WITHOUT CONTRAST TECHNIQUE: Contiguous axial images were obtained from the base of the skull through the vertex without intravenous contrast. COMPARISON:  None. FINDINGS: Brain: Cerebral volume normal. No acute intracranial hemorrhage. No evidence for acute large vessel territory infarct. No mass lesion, midline shift or mass effect. No hydrocephalus. No extra-axial fluid collection. Vascular: No hyperdense vessel. Skull: Scalp soft tissues and calvarium within normal limits. Sinuses/Orbits: Globes and orbital soft tissues normal. Visualized paranasal sinuses and mastoids are clear. ASPECTS [facility] Stroke Program Early CT Score) - Ganglionic level infarction (caudate, lentiform nuclei, internal capsule, insula, M1-M3 cortex): 7 - Supraganglionic infarction (M4-M6 cortex): 3 Total score (0-10 with 10 being normal): 10 IMPRESSION: 1. No acute intracranial infarct or other process identified. 2. ASPECTS is 10 Critical Value/emergent results were called by telephone at the time of interpretation on 10/16/2016 at 9:07 pm to Dr. Nicole Kindred, who verbally acknowledged these results. Electronically Signed   By: Jeannine Boga M.D.   On: 10/16/2016 21:08    Assessment/Plan 47 year old lady with a history of depression and GERD brought to the ED unresponsive with decerebrate posturing. Etiology is unclear. Convulsions were not described. Seizure  activity with postictal state cannot be ruled out. Patient has no clinical indications of an encephalopathic state. TIA at the onset of her symptoms with right-sided weakness and facial droop cannot be ruled out as well.  Recommendations: 1. Loading dose of IV Keppra 1000 mg followed by 500 mg every 12 hours 2. MRI of the brain without and with contrast 3. Echocardiogram 4. Fasting lipid panel and hemoglobin A1c 5. Aspirin 81 mg per day 6. Hypercoagulopathy panel 7. EEG, routine about study 8. Admission to step down unit overnight  This patient is critically ill and at significant risk of neurological worsening or death, and care requires constant monitoring of vital signs, hemodynamics,respiratory and cardiac monitoring, neurological assessment, discussion with family, other specialists and medical decision making of high complexity. Total critical care time was 90 minutes.  C.R. Nicole Kindred, [facility] [phone]  10/16/2016, 9:56 PM

## 2016-10-16 NOTE — ED Notes (Signed)
Pt vomited large amount of undigested food.

## 2016-10-16 NOTE — ED Triage Notes (Addendum)
Patient arrived with EMS from home reports slurred speech / right facial droop / right side weakness onset 7 pm this evening , Code Stroke initiated , EMS reported that pt. became unresponsive during transport. Evaluated by Dr. Kathrynn Humble / Dr. Nicole Kindred at arrival and decided to intubate pt. prior to CT scan .

## 2016-10-16 NOTE — ED Notes (Signed)
Pt.extubated by RT , pt. is lethargic/somnolent , O2 sat= 99% room air .

## 2016-10-16 NOTE — ED Notes (Signed)
Patient taken to trauma bay for intubation

## 2016-10-16 NOTE — ED Provider Notes (Addendum)
[facility] DEPT Provider Note   CSN: [id number] Arrival date & time: 10/16/16  2011     History   Chief Complaint Chief Complaint  Patient presents with  . Right Side Weakness    Unresponsive  . Facial Droop  . Aphasia    HPI Gwendolyn Torres is a 46 y.o. female.  HPI  Level 5 caveat for unresponsive   Pt comes in with cc of altered mental status. PT has hx of GERD and a recent unknown L ankle surgery. Per EMS, whilst having dinner pt started having slurred speech, R sided weakness and facial droop (around 7-7:30 pm). When EMS arrived, pt was following commands, but en route she became unresponsive. She was not given any meds, CBG at arrival > 100. Pt has no hx of strokes.   Past Medical History:  Diagnosis Date  . GERD (gastroesophageal reflux disease)     Patient Active Problem List   Diagnosis Date Noted  . Health care maintenance 05/02/2015  . Arm skin lesion, left 05/01/2015  . Foot pain, right 02/03/2015  . Pre-op evaluation 02/03/2015  . Obesity (BMI 30-39.9) 03/26/2014  . Osteoarthrosis, unspecified whether generalized or localized, lower leg 06/09/2012  . Situational anxiety 06/09/2012    Past Surgical History:  Procedure Laterality Date  . BUNIONECTOMY Right 09/25/2016   Procedure: lapidus type  modified mcbride;  Surgeon: Albertine Patricia, DPM;  Location: [facility];  Service: Podiatry;  Laterality: Right;  . CESAREAN SECTION  2004  . OSTECTOMY Right 09/25/2016   Procedure: OSTECTOMY RT  1SR,2ND 3RD  metatarsal;  Surgeon: Albertine Patricia, DPM;  Location: [facility];  Service: Podiatry;  Laterality: Right;  LMA LOCAL    OB History    No data available       Home Medications    Prior to Admission medications   Medication Sig Start Date End Date Taking? Authorizing Provider  buPROPion (WELLBUTRIN XL) 300 MG 24 hr tablet TAKE 1 TABLET (300 MG TOTAL) BY MOUTH DAILY. 05/23/16   Einar Pheasant, MD  HYDROcodone-acetaminophen  (NORCO) 7.5-325 MG tablet Take 1 tablet by mouth every 6 (six) hours as needed for moderate pain. 09/25/16   Troxler, Rodman Key, DPM  ibuprofen (ADVIL,MOTRIN) 200 MG tablet Take 200 mg by mouth as needed for headache.     [provider]  omeprazole (PRILOSEC) 20 MG capsule Take 20 mg by mouth daily.    [provider]    Family History Family History  Problem Relation Age of Onset  . Aortic aneurysm Father        also has popliteal aneurysm  . Hypertension Father   . Hypercholesterolemia Father   . Diabetes Father   . Aortic aneurysm Mother   . Hypercholesterolemia Mother   . Hypercholesterolemia Sister        x2  . Hyperlipidemia Brother   . Hypertension Sister   . Breast cancer Maternal Aunt   . Melanoma Maternal Uncle   . Colon cancer Neg Hx     Social History Social History  Substance Use Topics  . Smoking status: Never Smoker  . Smokeless tobacco: Never Used  . Alcohol use No     Allergies   Penicillins and Sulfa antibiotics   Review of Systems Review of Systems  Unable to perform ROS: Mental status change     Physical Exam Updated Vital Signs BP 111/60   Pulse 96   Temp 98.6 F (37 C)   Resp (!) 32   Ht  5\' 7"  (1.702 m)   Wt 121.6 kg (268 lb)   LMP 09/20/2016 Comment: preg test neg  SpO2 100%   BMI 41.97 kg/m   Physical Exam  Constitutional: She appears well-developed.  HENT:  Head: Atraumatic.  Eyes:  L pupils is 5 mm, R side is 4 mm  Neck: Neck supple.  Cardiovascular:  tachycardia  Pulmonary/Chest:  Shallow respirations  Abdominal: Bowel sounds are normal.  Neurological:  Pt is following simple commands but is somnolent. She is noted to be moving all 4 extremities to painful stimuli. Pt appears to be posturing  Skin: Skin is warm and dry.  Nursing note and vitals reviewed.    ED Treatments / Results  Labs (all labs ordered are listed, but only abnormal results are displayed) Labs Reviewed  CBC - Abnormal;  Notable for the following:       Result Value   Hemoglobin 11.2 (*)    HCT 34.0 (*)    All other components within normal limits  COMPREHENSIVE METABOLIC PANEL - Abnormal; Notable for the following:    Sodium 133 (*)    Glucose, Bld 109 (*)    BUN 21 (*)    Calcium 8.8 (*)    Albumin 3.4 (*)    Total Bilirubin 0.2 (*)    All other components within normal limits  I-STAT CHEM 8, ED - Abnormal; Notable for the following:    BUN 26 (*)    Glucose, Bld 109 (*)    Calcium, Ion 1.10 (*)    Hemoglobin 11.9 (*)    HCT 35.0 (*)    All other components within normal limits  CBG MONITORING, ED - Abnormal; Notable for the following:    Glucose-Capillary 107 (*)    All other components within normal limits  ETHANOL  PROTIME-INR  APTT  DIFFERENTIAL  TRIGLYCERIDES  RAPID URINE DRUG SCREEN, HOSP PERFORMED  URINALYSIS, ROUTINE W REFLEX MICROSCOPIC  I-STAT TROPOININ, ED  I-STAT CHEM 8, ED  I-STAT TROPOININ, ED  I-STAT BETA HCG BLOOD, ED ([facility], [facility], [facility] ONLY)    EKG  EKG Interpretation None       Radiology Ct Angio Head W Or Wo Contrast  Result Date: 10/16/2016 CLINICAL DATA:  Initial evaluation for acute stroke. EXAM: CT ANGIOGRAPHY HEAD AND NECK TECHNIQUE: Multidetector CT imaging of the head and neck was performed using the standard protocol during bolus administration of intravenous contrast. Multiplanar CT image reconstructions and MIPs were obtained to evaluate the vascular anatomy. Carotid stenosis measurements (when applicable) are obtained utilizing NASCET criteria, using the distal internal carotid diameter as the denominator. CONTRAST:  50 cc of Isovue 370. COMPARISON:  Prior head CT from earlier the same day. FINDINGS: CTA NECK FINDINGS Aortic arch: Visualized aortic arch of normal caliber with normal branch pattern. No high-grade stenosis about the origin of the great vessels. Visualized subclavian artery is widely patent. Right carotid system: Right common carotid artery patent  from its origin to the bifurcation. No significant atheromatous plaque about the right bifurcation. Right ICA widely patent in the bifurcation to the skullbase without stenosis, dissection, or occlusion. Left carotid system: Left common carotid artery patent from its origin to the bifurcation without stenosis. No significant atheromatous narrowing about the left bifurcation. Left ICA widely patent from the bifurcation to the skullbase without stenosis, dissection, or occlusion. Vertebral arteries: Both of the vertebral arteries arise from the subclavian arteries. Left vertebral artery slightly dominant. Both of the vertebral arteries widely patent within the neck without  stenosis, dissection, or occlusion. Skeleton: No acute osseus abnormality. No worrisome lytic or blastic osseous lesions. Mild degenerate spondylolysis noted at C5-6. Other neck: No acute soft tissue abnormality within the neck. Endotracheal tube in place. Thyroid normal. No adenopathy. Upper chest: Atelectatic changes present within the partially visualized lungs, right greater than left. Remainder the visualized lungs are otherwise clear. Review of the MIP images confirms the above findings CTA HEAD FINDINGS Anterior circulation: Petrous segments grossly patent, although evaluation limited by timing of the contrast bolus. Cavernous and supraclinoid ICA is patent without flow-limiting stenosis. ICA termini widely patent. A1 segments patent. Right A1 segment slightly hypoplastic. Anterior communicating artery not well seen. Left anterior cerebral artery patent to its distal aspect. Right anterior cerebral artery hypoplastic and not well visualize, but felt to likely be patent to its distal aspect is well. M1 segments patent without stenosis or occlusion. No proximal M2 occlusion distal MCA branches well opacified and symmetric. Posterior circulation: Vertebral artery is patent to the vertebrobasilar junction without stenosis. Posterior inferior  cerebral arteries patent proximally. Basilar artery widely patent. Superior cerebral arteries patent bilaterally. Both of the posterior cerebral artery supplied via the basilar artery and are patent to their distal aspects. Venous sinuses: Patent. Anatomic variants: No significant anatomic variant. No aneurysm or vascular malformation. Delayed phase: Not performed. Review of the MIP images confirms the above findings IMPRESSION: 1. Negative CTA for emergent large vessel occlusion. No high-grade or correctable stenosis identified. 2. Endotracheal tube in place with atelectatic changes within the visualized lungs, right greater than left. Dr. Nicole Kindred of the Stroke Neurology service has been paged regarding these results at approximately 9:26 p.m. on 10/16/2016. Currently awaiting a call back. Addendum will be made once results were conveyed. Electronically Signed   By: Jeannine Boga M.D.   On: 10/16/2016 21:32   Ct Angio Neck W Or Wo Contrast  Result Date: 10/16/2016 CLINICAL DATA:  Initial evaluation for acute stroke. EXAM: CT ANGIOGRAPHY HEAD AND NECK TECHNIQUE: Multidetector CT imaging of the head and neck was performed using the standard protocol during bolus administration of intravenous contrast. Multiplanar CT image reconstructions and MIPs were obtained to evaluate the vascular anatomy. Carotid stenosis measurements (when applicable) are obtained utilizing NASCET criteria, using the distal internal carotid diameter as the denominator. CONTRAST:  50 cc of Isovue 370. COMPARISON:  Prior head CT from earlier the same day. FINDINGS: CTA NECK FINDINGS Aortic arch: Visualized aortic arch of normal caliber with normal branch pattern. No high-grade stenosis about the origin of the great vessels. Visualized subclavian artery is widely patent. Right carotid system: Right common carotid artery patent from its origin to the bifurcation. No significant atheromatous plaque about the right bifurcation. Right ICA  widely patent in the bifurcation to the skullbase without stenosis, dissection, or occlusion. Left carotid system: Left common carotid artery patent from its origin to the bifurcation without stenosis. No significant atheromatous narrowing about the left bifurcation. Left ICA widely patent from the bifurcation to the skullbase without stenosis, dissection, or occlusion. Vertebral arteries: Both of the vertebral arteries arise from the subclavian arteries. Left vertebral artery slightly dominant. Both of the vertebral arteries widely patent within the neck without stenosis, dissection, or occlusion. Skeleton: No acute osseus abnormality. No worrisome lytic or blastic osseous lesions. Mild degenerate spondylolysis noted at C5-6. Other neck: No acute soft tissue abnormality within the neck. Endotracheal tube in place. Thyroid normal. No adenopathy. Upper chest: Atelectatic changes present within the partially visualized lungs, right greater than  left. Remainder the visualized lungs are otherwise clear. Review of the MIP images confirms the above findings CTA HEAD FINDINGS Anterior circulation: Petrous segments grossly patent, although evaluation limited by timing of the contrast bolus. Cavernous and supraclinoid ICA is patent without flow-limiting stenosis. ICA termini widely patent. A1 segments patent. Right A1 segment slightly hypoplastic. Anterior communicating artery not well seen. Left anterior cerebral artery patent to its distal aspect. Right anterior cerebral artery hypoplastic and not well visualize, but felt to likely be patent to its distal aspect is well. M1 segments patent without stenosis or occlusion. No proximal M2 occlusion distal MCA branches well opacified and symmetric. Posterior circulation: Vertebral artery is patent to the vertebrobasilar junction without stenosis. Posterior inferior cerebral arteries patent proximally. Basilar artery widely patent. Superior cerebral arteries patent bilaterally.  Both of the posterior cerebral artery supplied via the basilar artery and are patent to their distal aspects. Venous sinuses: Patent. Anatomic variants: No significant anatomic variant. No aneurysm or vascular malformation. Delayed phase: Not performed. Review of the MIP images confirms the above findings IMPRESSION: 1. Negative CTA for emergent large vessel occlusion. No high-grade or correctable stenosis identified. 2. Endotracheal tube in place with atelectatic changes within the visualized lungs, right greater than left. Dr. Nicole Kindred of the Stroke Neurology service has been paged regarding these results at approximately 9:26 p.m. on 10/16/2016. Currently awaiting a call back. Addendum will be made once results were conveyed. Electronically Signed   By: Jeannine Boga M.D.   On: 10/16/2016 21:32   Dg Chest Port 1 View  Result Date: 10/16/2016 CLINICAL DATA:  ETT EXAM: PORTABLE CHEST 1 VIEW COMPARISON:  None. FINDINGS: Endotracheal tube tip is approximately 2.2 cm superior to the carina. There are low lung volumes. The heart size is upper limits of normal. Prominent upper mediastinum with vague opacity in the right suprahilar region. No pneumothorax. IMPRESSION: 1. Endotracheal tube tip about 2.2 cm superior to carina 2. Low lung volumes. Vague right suprahilar and paramediastinal opacity, cannot exclude infiltrate or mass. Consider CT for further evaluation. Electronically Signed   By: Donavan Foil M.D.   On: 10/16/2016 20:39   Ct Head Code Stroke W/o Cm  Result Date: 10/16/2016 CLINICAL DATA:  Code stroke. Initial evaluation for acute weakness, slurred speech. EXAM: CT HEAD WITHOUT CONTRAST TECHNIQUE: Contiguous axial images were obtained from the base of the skull through the vertex without intravenous contrast. COMPARISON:  None. FINDINGS: Brain: Cerebral volume normal. No acute intracranial hemorrhage. No evidence for acute large vessel territory infarct. No mass lesion, midline shift or mass  effect. No hydrocephalus. No extra-axial fluid collection. Vascular: No hyperdense vessel. Skull: Scalp soft tissues and calvarium within normal limits. Sinuses/Orbits: Globes and orbital soft tissues normal. Visualized paranasal sinuses and mastoids are clear. ASPECTS [facility] Stroke Program Early CT Score) - Ganglionic level infarction (caudate, lentiform nuclei, internal capsule, insula, M1-M3 cortex): 7 - Supraganglionic infarction (M4-M6 cortex): 3 Total score (0-10 with 10 being normal): 10 IMPRESSION: 1. No acute intracranial infarct or other process identified. 2. ASPECTS is 10 Critical Value/emergent results were called by telephone at the time of interpretation on 10/16/2016 at 9:07 pm to Dr. Nicole Kindred, who verbally acknowledged these results. Electronically Signed   By: Jeannine Boga M.D.   On: 10/16/2016 21:08    Procedures Procedures (including critical care time)  INTUBATION Performed by: Varney Biles  Required items: required blood products, implants, devices, and special equipment available Patient identity confirmed: provided demographic data and hospital-assigned identification number Time  out: Immediately prior to procedure a "time out" was called to verify the correct patient, procedure, equipment, support staff and site/side marked as required.  Indications: controlling airway, unable to control secretions  Intubation method: Direct Laryngoscopy   Preoxygenation: BVM  Sedatives: 20 Etomidate Paralytic: 100 Succinylcholine  Tube Size: 7.5  cuffed  Post-procedure assessment: chest rise and ETCO2 monitor Breath sounds: equal and absent over the epigastrium Tube secured with: ETT holder Chest x-ray interpreted by radiologist and me.  Chest x-ray findings: endotracheal tube in appropriate position  Patient tolerated the procedure well with no immediate complications.   CRITICAL CARE Performed by: Varney Biles   Total critical care time: 33  minutes  Critical care time was exclusive of separately billable procedures and treating other patients.  Critical care was necessary to treat or prevent imminent or life-threatening deterioration.  Critical care was time spent personally by me on the following activities: development of treatment plan with patient and/or surrogate as well as nursing, discussions with consultants, evaluation of patient's response to treatment, examination of patient, obtaining history from patient or surrogate, ordering and performing treatments and interventions, ordering and review of laboratory studies, ordering and review of radiographic studies, pulse oximetry and re-evaluation of patient's condition.   Medications Ordered in ED Medications  ondansetron (ZOFRAN) 4 MG/2ML injection (not administered)  ammonia inhalant (  Given 10/16/16 2029)  etomidate (AMIDATE) injection 20 mg (20 mg Intravenous Given 10/16/16 2024)  succinylcholine (ANECTINE) injection 100 mg (100 mg Intravenous Given 10/16/16 2024)  LORazepam (ATIVAN) injection 4 mg (4 mg Intravenous Given 10/16/16 2037)  morphine 4 MG/ML injection 4 mg (4 mg Intravenous Given 10/16/16 2038)  sodium chloride 0.9 % bolus 500 mL (0 mLs Intravenous Stopped 10/16/16 2123)  iopamidol (ISOVUE-370) 76 % injection 50 mL (50 mLs Intravenous Contrast Given 10/16/16 2102)  naloxone [facility]) injection 1 mg (1 mg Intravenous Given 10/16/16 2108)  ondansetron (ZOFRAN) injection 4 mg (4 mg Intravenous Given 10/16/16 2113)     Initial Impression / Assessment and Plan / ED Course  I have reviewed the triage vital signs and the nursing notes.  Pertinent labs & imaging results that were available during my care of the patient were reviewed by me and considered in my medical decision making (see chart for details).  Clinical Course as of Oct 17 2147  Thu Oct 16, 2016  2147 CT head was negative. Neurologist decided to give narcan 2 mg, and pt improved. Pt was already  moving a bit, and it was hard to control her sedation prior to the narcan - so I am not 100% sure that this was opioid related issue.  Neuro recommends admission to stepdown. Close neuro monitoring.   [AN]    Clinical Course User Index [AN] Varney Biles, MD    Pt comes in unresponsive. She initially had sudden onset R sided slurred speech and weakness, and it was en route that the change in mental status occurred. We had to secure airway, as pt was not able to control her secretions. Code stroke called on field, so neuro is at bedside.  Concerns for brain bleed are highest, followed by seizures and ischemic stroke.  Final Clinical Impressions(s) / ED Diagnoses   Final diagnoses:  Glasgow coma scale total score 3-8, unspecified coma timing ([place])  Unresponsive episode    New Prescriptions New Prescriptions   No medications on file     Varney Biles, MD 10/16/16 2051    Varney Biles, MD 10/16/16 2149

## 2016-10-16 NOTE — ED Notes (Signed)
Pt. intubated by EDP using #7.5 ETT assisted by RT.

## 2016-10-16 NOTE — Procedures (Signed)
Extubation Procedure Note  Patient Details:   Name: Gwendolyn Torres DOB: 1971/02/13 MRN: [id number]   Airway Documentation:     Evaluation  O2 sats: stable throughout Complications: No apparent complications Patient did tolerate procedure well.     Yes   Pt came in Code Stroke resembling Right sided weakness and facial droop. Per EMS patient became unresponsive during transport. Pre-extubation patient began vomiting massive amounts of undigested food appearing to be mac cheese noodles.  Pt was extubated per MD Wallie Char request. Pt following commands. Pt is able to speak and state her name post-extubation. No further apparent complications noted.  Leigh Aurora, BS, RRT, RCP 10/16/2016, 9:25 PM

## 2016-10-17 ENCOUNTER — Observation Stay (HOSPITAL_COMMUNITY): Payer: 59

## 2016-10-17 ENCOUNTER — Telehealth: Payer: Self-pay | Admitting: Internal Medicine

## 2016-10-17 ENCOUNTER — Observation Stay (HOSPITAL_BASED_OUTPATIENT_CLINIC_OR_DEPARTMENT_OTHER): Payer: 59

## 2016-10-17 ENCOUNTER — Encounter (HOSPITAL_COMMUNITY): Payer: Self-pay | Admitting: *Deleted

## 2016-10-17 DIAGNOSIS — M1991 Primary osteoarthritis, unspecified site: Secondary | ICD-10-CM | POA: Diagnosis present

## 2016-10-17 DIAGNOSIS — R531 Weakness: Secondary | ICD-10-CM

## 2016-10-17 DIAGNOSIS — F329 Major depressive disorder, single episode, unspecified: Secondary | ICD-10-CM | POA: Diagnosis present

## 2016-10-17 DIAGNOSIS — R404 Transient alteration of awareness: Secondary | ICD-10-CM

## 2016-10-17 DIAGNOSIS — R29726 NIHSS score 26: Secondary | ICD-10-CM | POA: Diagnosis present

## 2016-10-17 DIAGNOSIS — I639 Cerebral infarction, unspecified: Secondary | ICD-10-CM | POA: Diagnosis present

## 2016-10-17 DIAGNOSIS — R4189 Other symptoms and signs involving cognitive functions and awareness: Secondary | ICD-10-CM

## 2016-10-17 DIAGNOSIS — K219 Gastro-esophageal reflux disease without esophagitis: Secondary | ICD-10-CM | POA: Diagnosis present

## 2016-10-17 DIAGNOSIS — Z880 Allergy status to penicillin: Secondary | ICD-10-CM | POA: Diagnosis not present

## 2016-10-17 DIAGNOSIS — G8191 Hemiplegia, unspecified affecting right dominant side: Secondary | ICD-10-CM | POA: Diagnosis present

## 2016-10-17 DIAGNOSIS — R402432 Glasgow coma scale score 3-8, at arrival to emergency department: Secondary | ICD-10-CM | POA: Diagnosis present

## 2016-10-17 DIAGNOSIS — F39 Unspecified mood [affective] disorder: Secondary | ICD-10-CM | POA: Diagnosis present

## 2016-10-17 DIAGNOSIS — E669 Obesity, unspecified: Secondary | ICD-10-CM | POA: Diagnosis present

## 2016-10-17 DIAGNOSIS — Z882 Allergy status to sulfonamides status: Secondary | ICD-10-CM | POA: Diagnosis not present

## 2016-10-17 DIAGNOSIS — Z6841 Body Mass Index (BMI) 40.0 and over, adult: Secondary | ICD-10-CM | POA: Diagnosis not present

## 2016-10-17 DIAGNOSIS — R569 Unspecified convulsions: Secondary | ICD-10-CM | POA: Diagnosis present

## 2016-10-17 DIAGNOSIS — R40243 Glasgow coma scale score 3-8, unspecified time: Secondary | ICD-10-CM

## 2016-10-17 DIAGNOSIS — Z9289 Personal history of other medical treatment: Secondary | ICD-10-CM

## 2016-10-17 DIAGNOSIS — Z79899 Other long term (current) drug therapy: Secondary | ICD-10-CM | POA: Diagnosis not present

## 2016-10-17 DIAGNOSIS — R4701 Aphasia: Secondary | ICD-10-CM | POA: Diagnosis present

## 2016-10-17 LAB — RAPID URINE DRUG SCREEN, HOSP PERFORMED
AMPHETAMINES: NOT DETECTED
BENZODIAZEPINES: POSITIVE — AB
Barbiturates: NOT DETECTED
Cocaine: NOT DETECTED
OPIATES: POSITIVE — AB
Tetrahydrocannabinol: NOT DETECTED

## 2016-10-17 LAB — ECHOCARDIOGRAM COMPLETE
AVLVOTPG: 6 mmHg
Ao-asc: 30 cm
CHL CUP DOP CALC LVOT VTI: 23.1 cm
CHL CUP STROKE VOLUME: 95 mL
E/e' ratio: 10.6
EWDT: 208 ms
FS: 30 % (ref 28–44)
HEIGHTINCHES: 67 in
IV/PV OW: 1.08
LA ID, A-P, ES: 44 mm
LA diam index: 1.79 cm/m2
LA vol index: 18.5 mL/m2
LA vol: 45.4 mL
LAVOLA4C: 38.8 mL
LDCA: 2.84 cm2
LEFT ATRIUM END SYS DIAM: 44 mm
LV E/e' medial: 10.6
LV E/e'average: 10.6
LV TDI E'LATERAL: 13.3
LV dias vol index: 56 mL/m2
LV e' LATERAL: 13.3 cm/s
LVDIAVOL: 137 mL — AB (ref 46–106)
LVOT SV: 66 mL
LVOT peak vel: 120 cm/s
LVOTD: 19 mm
LVSYSVOL: 42 mL (ref 14–42)
LVSYSVOLIN: 17 mL/m2
MV Dec: 208
MV Peak grad: 8 mmHg
MV pk E vel: 141 m/s
PW: 9.72 mm — AB (ref 0.6–1.1)
RV LATERAL S' VELOCITY: 15.7 cm/s
RV TAPSE: 28.6 mm
RV sys press: 22 mmHg
Reg peak vel: 219 cm/s
Simpson's disk: 70
TDI e' medial: 8.45
TRMAXVEL: 219 cm/s
Weight: 4275.16 oz

## 2016-10-17 LAB — URINALYSIS, ROUTINE W REFLEX MICROSCOPIC
BACTERIA UA: NONE SEEN
Bilirubin Urine: NEGATIVE
Glucose, UA: NEGATIVE mg/dL
HGB URINE DIPSTICK: NEGATIVE
Ketones, ur: NEGATIVE mg/dL
NITRITE: NEGATIVE
PROTEIN: NEGATIVE mg/dL
Specific Gravity, Urine: 1.033 — ABNORMAL HIGH (ref 1.005–1.030)
pH: 5 (ref 5.0–8.0)

## 2016-10-17 LAB — ANTITHROMBIN III
AntiThromb III Func: 87 % (ref 75–120)
AntiThromb III Func: 91 % (ref 75–120)

## 2016-10-17 LAB — LIPID PANEL
Cholesterol: 170 mg/dL (ref 0–200)
HDL: 51 mg/dL (ref >40)
LDL CALC: 105 mg/dL — AB (ref 0–99)
Total CHOL/HDL Ratio: 3.3 RATIO
Triglycerides: 71 mg/dL (ref <150)
VLDL: 14 mg/dL (ref 0–40)

## 2016-10-17 LAB — SEDIMENTATION RATE: SED RATE: 28 mm/h — AB (ref 0–22)

## 2016-10-17 LAB — C-REACTIVE PROTEIN: CRP: 1 mg/dL — AB (ref <1.0)

## 2016-10-17 LAB — MRSA PCR SCREENING: MRSA by PCR: NEGATIVE

## 2016-10-17 MED ORDER — ONDANSETRON HCL 4 MG/2ML IJ SOLN: 4.0000 mg | Freq: Four times a day (QID) | INTRAMUSCULAR | Status: DC | PRN

## 2016-10-17 MED ORDER — ACETAMINOPHEN 325 MG PO TABS
650.0000 mg | ORAL_TABLET | ORAL | Status: DC | PRN
Start: 2016-10-17 — End: 2016-10-19
  Administered 2016-10-17: 650 mg via ORAL
  Filled 2016-10-17: qty 2

## 2016-10-17 MED ORDER — ACETAMINOPHEN 650 MG RE SUPP: 650.0000 mg | RECTAL | Status: DC | PRN

## 2016-10-17 MED ORDER — HYDROCODONE-ACETAMINOPHEN 7.5-325 MG PO TABS: 1.0000 | ORAL_TABLET | Freq: Four times a day (QID) | ORAL | Status: DC | PRN

## 2016-10-17 MED ORDER — ACETAMINOPHEN 160 MG/5ML PO SOLN: 650.0000 mg | ORAL | Status: DC | PRN

## 2016-10-17 MED ORDER — GADOBENATE DIMEGLUMINE 529 MG/ML IV SOLN: 20.0000 mL | Freq: Once | INTRAVENOUS | Status: DC

## 2016-10-17 MED ORDER — ASPIRIN EC 81 MG PO TBEC
81.0000 mg | DELAYED_RELEASE_TABLET | Freq: Every day | ORAL | Status: DC
  Administered 2016-10-17 – 2016-10-18 (×2): 81 mg via ORAL
  Filled 2016-10-17 (×2): qty 1

## 2016-10-17 MED ORDER — SENNOSIDES-DOCUSATE SODIUM 8.6-50 MG PO TABS: 1.0000 | ORAL_TABLET | Freq: Every evening | ORAL | Status: DC | PRN

## 2016-10-17 MED ORDER — LEVETIRACETAM 500 MG/5ML IV SOLN
500.0000 mg | Freq: Two times a day (BID) | INTRAVENOUS | Status: DC
  Administered 2016-10-17 (×2): 500 mg via INTRAVENOUS
  Filled 2016-10-17 (×3): qty 5

## 2016-10-17 MED ORDER — BUPROPION HCL ER (XL) 150 MG PO TB24
300.0000 mg | ORAL_TABLET | Freq: Every day | ORAL | Status: DC
  Administered 2016-10-17 – 2016-10-18 (×2): 300 mg via ORAL
  Filled 2016-10-17 (×2): qty 2

## 2016-10-17 MED ORDER — PANTOPRAZOLE SODIUM 40 MG PO TBEC
40.0000 mg | DELAYED_RELEASE_TABLET | Freq: Every day | ORAL | Status: DC
  Administered 2016-10-17 – 2016-10-18 (×2): 40 mg via ORAL
  Filled 2016-10-17 (×2): qty 1

## 2016-10-17 MED ORDER — SODIUM CHLORIDE 0.9 % IV SOLN
INTRAVENOUS | Status: DC
  Administered 2016-10-17: 01:00:00 via INTRAVENOUS

## 2016-10-17 NOTE — Plan of Care (Signed)
Problem: Safety: Goal: Ability to remain free from injury will improve Outcome: Progressing Spoke with pt about need to call for assistance before getting oob pt states understanding call bell within reach

## 2016-10-17 NOTE — Progress Notes (Signed)
  Echocardiogram 2D Echocardiogram has been performed.  Bobbye Charleston 10/17/2016, 2:47 PM

## 2016-10-17 NOTE — Procedures (Signed)
ELECTROENCEPHALOGRAM REPORT  Date of Study: 10/17/2016  Patient's Name: Gwendolyn Torres MRN: [id number] Date of Birth: 1970-09-12  Referring Provider: Lily Kocher, MD  Clinical History: 46 year old woman with who became unresponsive with right sided weakness and posturing.  Medications: acetaminophen (TYLENOL) 650 mg  aspirin EC tablet 81 mg  buPROPion (WELLBUTRIN XL) 30mg  HYDROcodone-acetaminophen (NORCO) 7.5-325 MG  levETIRAcetam (KEPPRA) 500 mg ondansetron (ZOFRAN) injection 4 mg  pantoprazole (PROTONIX) EC tablet 40 mg  senna-docusate (Senokot-S) tablet 1 tablet   Technical Summary: A multichannel digital EEG recording measured by the international 10-20 system with electrodes applied with paste and impedances below 5000 ohms performed in our laboratory with EKG monitoring in an awake and asleep patient.  Hyperventilation and photic stimulation were not performed.  The digital EEG was referentially recorded, reformatted, and digitally filtered in a variety of bipolar and referential montages for optimal display.    Description: The patient is awake and asleep during the recording.  During maximal wakefulness, there is a symmetric, medium voltage 12 Hz posterior dominant rhythm that attenuates with eye opening.  The record is symmetric.  During drowsiness and sleep, there is an increase in theta slowing of the background.  Vertex waves and symmetric sleep spindles were seen.  There were no epileptiform discharges or electrographic seizures seen.    EKG lead was unremarkable.  Impression: This awake and asleep EEG is normal.    Clinical Correlation: A normal EEG does not exclude a clinical diagnosis of epilepsy.  If further clinical questions remain, prolonged EEG may be helpful.  Clinical correlation is advised.   Metta Clines, DO

## 2016-10-17 NOTE — Care Management Note (Addendum)
Case Management Note  Patient Details  Name: Gwendolyn Torres MRN: [id number] Date of Birth: 06-Jan-1971  Subjective/Objective:    From home with spouse, pta indep,  presents with seizure with right sided facial droop and weakness, Neurology consulted, for MRI.  She has [employer].     PCP Einar Pheasant             Action/Plan: NCM will follow for dc needs.   Expected Discharge Date:  10/19/16               Expected Discharge Plan:     In-House Referral:     Discharge planning Services  CM Consult  Post Acute Care Choice:    Choice offered to:     DME Arranged:    DME Agency:     HH Arranged:    HH Agency:     Status of Service:  In process, will continue to follow  If discussed at Long Length of Stay Meetings, dates discussed:    Additional Comments:  Zenon Mayo, RN 10/17/2016, 8:29 AM

## 2016-10-17 NOTE — Telephone Encounter (Signed)
Called [place].  Discussed his wife's presentation.  She is planning for MRI today.  Will keep me posted.  Informed to let me know if needs anything.

## 2016-10-17 NOTE — Telephone Encounter (Signed)
FYI - Pt spouse called and stated that pt is at [facility]. They think that she had an early stroke. They did a CT scan and waiting to have an MRI done. Please advise, thank you!  Call [facility] @ [phone]

## 2016-10-17 NOTE — Telephone Encounter (Signed)
Per chart patient is at [facility] still. FYI

## 2016-10-17 NOTE — Progress Notes (Signed)
Patient and patients husband requesting to speak with neurologist regarding questions about MRI results.  Dr. Cheral Marker paged, will continue to follow up.

## 2016-10-17 NOTE — Progress Notes (Signed)
EEG completed, results pending. 

## 2016-10-17 NOTE — Progress Notes (Signed)
Patient experiencing some vomiting with complaints of headache.  Patient states "I have had this happen when I get migraines in the past.  Pt. VSS with no s/s of distress noted.  Neuro assessment completed no changes from baseline.  Dr. Lonny Prude notified of patients change in condition.  Will continue to monitor.

## 2016-10-17 NOTE — Progress Notes (Signed)
PROGRESS NOTE    Gwendolyn Torres  ATF:[id number] DOB: 10/08/1970 DOA: 10/16/2016 PCP: Einar Pheasant, MD   Brief Narrative: Gwendolyn Torres is a 46 y.o. female with a history of GERD, mood disorder and right foot surgery. She presented with altered mental status, right sided facial droop and right sided weakness. Concern for seizures, started on AED and found to have an acute infarct.   Assessment & Plan:   Principal Problem:   Seizure ([place]) Active Problems:   Right sided weakness   Unresponsive episode   Glasgow coma scale total score 3-8 ([place])   History of ETT   Seizure Running diagnosis. No recurrent episodes -neurology recommendations: keppra -seizure precautions  Acute ischemic infarct Right parietal stroke on MRI. -neurology recommendations: hypercoag panel, a1c, lipid panel, aspirin -echocardiogram  Right-sided weakness Right-sided facial droop Resolved. Does not seem explained by location of infarct.  GERD -continue Protonix   DVT prophylaxis: SCDs Code Status: Full code Family Communication: Husband and daughter at bedside Disposition Plan: Discharge in 24 hours pending stroke workup   Consultants:   Neurology  Procedures:   Echocardiogram (6/15)  Antimicrobials:  None    Subjective: Patient reports no residual deficits.  Objective: Vitals:   10/16/16 2315 10/16/16 2330 10/17/16 0314 10/17/16 0700  BP: (!) 98/57 108/64 (!) 109/57 122/68  Pulse: (!) 113 (!) 110 92 97  Resp: 15 16 15  (!) 8  Temp:  98.7 F (37.1 C) 97.9 F (36.6 C) 98.2 F (36.8 C)  TempSrc:  Oral Oral Oral  SpO2: 92% 93% 94% 96%  Weight:  121.2 kg (267 lb 3.2 oz)    Height:  5\' 7"  (1.702 m)      Intake/Output Summary (Last 24 hours) at 10/17/16 0959 Last data filed at 10/17/16 0600  Gross per 24 hour  Intake              600 ml  Output              800 ml  Net             -200 ml   Filed Weights   10/16/16 2100 10/16/16 2330  Weight: 121.6 kg (268 lb) 121.2 kg  (267 lb 3.2 oz)    Examination:  General exam: Appears calm and comfortable Respiratory system: Clear to auscultation. Respiratory effort normal. Cardiovascular system: S1 & S2 heard, RRR. No murmurs. Gastrointestinal system: Abdomen is nondistended, soft and nontender. No organomegaly or masses felt. Normal bowel sounds heard. Central nervous system: Alert and oriented. No focal neurological deficits. Extremities: No edema. No calf tenderness Skin: No cyanosis. No rashes Psychiatry: Judgement and insight appear normal. Mood & affect appropriate.     Data Reviewed: I have personally reviewed following labs and imaging studies  CBC:  Recent Labs Lab 10/16/16 2011 10/16/16 2038  WBC 9.7  --   NEUTROABS 5.7  --   HGB 11.2* 11.9*  HCT 34.0* 35.0*  MCV 84.2  --   PLT 274  --    Basic Metabolic Panel:  Recent Labs Lab 10/16/16 2011 10/16/16 2038  NA 133* 135  K 3.9 4.9  CL 101 102  CO2 24  --   GLUCOSE 109* 109*  BUN 21* 26*  CREATININE 0.90 0.90  CALCIUM 8.8*  --    GFR: Estimated Creatinine Clearance: 106.4 mL/min (by C-G formula based on SCr of 0.9 mg/dL). Liver Function Tests:  Recent Labs Lab 10/16/16 2011  AST 26  ALT 23  ALKPHOS  75  BILITOT 0.2*  PROT 6.5  ALBUMIN 3.4*   No results for input(s): LIPASE, AMYLASE in the last 168 hours. No results for input(s): AMMONIA in the last 168 hours. Coagulation Profile:  Recent Labs Lab 10/16/16 2011  INR 1.04   Cardiac Enzymes: No results for input(s): CKTOTAL, CKMB, CKMBINDEX, TROPONINI in the last 168 hours. BNP (last 3 results) No results for input(s): PROBNP in the last 8760 hours. HbA1C: No results for input(s): HGBA1C in the last 72 hours. CBG:  Recent Labs Lab 10/16/16 2013  GLUCAP 107*   Lipid Profile:  Recent Labs  10/16/16 2025 10/17/16 0039  CHOL  --  170  HDL  --  51  LDLCALC  --  105*  TRIG 98 71  CHOLHDL  --  3.3   Thyroid Function Tests: No results for input(s): TSH,  T4TOTAL, FREET4, T3FREE, THYROIDAB in the last 72 hours. Anemia Panel: No results for input(s): VITAMINB12, FOLATE, FERRITIN, TIBC, IRON, RETICCTPCT in the last 72 hours. Sepsis Labs: No results for input(s): PROCALCITON, LATICACIDVEN in the last 168 hours.  Recent Results (from the past 240 hour(s))  MRSA PCR Screening     Status: None   Collection Time: 10/16/16 11:56 PM  Result Value Ref Range Status   MRSA by PCR NEGATIVE NEGATIVE Final    Comment:        The GeneXpert MRSA Assay (FDA approved for NASAL specimens only), is one component of a comprehensive MRSA colonization surveillance program. It is not intended to diagnose MRSA infection nor to guide or monitor treatment for MRSA infections.          Radiology Studies: Ct Angio Head W Or Wo Contrast  Result Date: 10/16/2016 CLINICAL DATA:  Initial evaluation for acute stroke. EXAM: CT ANGIOGRAPHY HEAD AND NECK TECHNIQUE: Multidetector CT imaging of the head and neck was performed using the standard protocol during bolus administration of intravenous contrast. Multiplanar CT image reconstructions and MIPs were obtained to evaluate the vascular anatomy. Carotid stenosis measurements (when applicable) are obtained utilizing NASCET criteria, using the distal internal carotid diameter as the denominator. CONTRAST:  50 cc of Isovue 370. COMPARISON:  Prior head CT from earlier the same day. FINDINGS: CTA NECK FINDINGS Aortic arch: Visualized aortic arch of normal caliber with normal branch pattern. No high-grade stenosis about the origin of the great vessels. Visualized subclavian artery is widely patent. Right carotid system: Right common carotid artery patent from its origin to the bifurcation. No significant atheromatous plaque about the right bifurcation. Right ICA widely patent in the bifurcation to the skullbase without stenosis, dissection, or occlusion. Left carotid system: Left common carotid artery patent from its origin to  the bifurcation without stenosis. No significant atheromatous narrowing about the left bifurcation. Left ICA widely patent from the bifurcation to the skullbase without stenosis, dissection, or occlusion. Vertebral arteries: Both of the vertebral arteries arise from the subclavian arteries. Left vertebral artery slightly dominant. Both of the vertebral arteries widely patent within the neck without stenosis, dissection, or occlusion. Skeleton: No acute osseus abnormality. No worrisome lytic or blastic osseous lesions. Mild degenerate spondylolysis noted at C5-6. Other neck: No acute soft tissue abnormality within the neck. Endotracheal tube in place. Thyroid normal. No adenopathy. Upper chest: Atelectatic changes present within the partially visualized lungs, right greater than left. Remainder the visualized lungs are otherwise clear. Review of the MIP images confirms the above findings CTA HEAD FINDINGS Anterior circulation: Petrous segments grossly patent, although evaluation limited by timing  of the contrast bolus. Cavernous and supraclinoid ICA is patent without flow-limiting stenosis. ICA termini widely patent. A1 segments patent. Right A1 segment slightly hypoplastic. Anterior communicating artery not well seen. Left anterior cerebral artery patent to its distal aspect. Right anterior cerebral artery hypoplastic and not well visualize, but felt to likely be patent to its distal aspect is well. M1 segments patent without stenosis or occlusion. No proximal M2 occlusion distal MCA branches well opacified and symmetric. Posterior circulation: Vertebral artery is patent to the vertebrobasilar junction without stenosis. Posterior inferior cerebral arteries patent proximally. Basilar artery widely patent. Superior cerebral arteries patent bilaterally. Both of the posterior cerebral artery supplied via the basilar artery and are patent to their distal aspects. Venous sinuses: Patent. Anatomic variants: No significant  anatomic variant. No aneurysm or vascular malformation. Delayed phase: Not performed. Review of the MIP images confirms the above findings IMPRESSION: 1. Negative CTA for emergent large vessel occlusion. No high-grade or correctable stenosis identified. 2. Endotracheal tube in place with atelectatic changes within the visualized lungs, right greater than left. Dr. Nicole Kindred of the Stroke Neurology service has been paged regarding these results at approximately 9:26 p.m. on 10/16/2016. Currently awaiting a call back. Addendum will be made once results were conveyed. Electronically Signed   By: Jeannine Boga M.D.   On: 10/16/2016 21:32   Ct Angio Neck W Or Wo Contrast  Result Date: 10/16/2016 CLINICAL DATA:  Initial evaluation for acute stroke. EXAM: CT ANGIOGRAPHY HEAD AND NECK TECHNIQUE: Multidetector CT imaging of the head and neck was performed using the standard protocol during bolus administration of intravenous contrast. Multiplanar CT image reconstructions and MIPs were obtained to evaluate the vascular anatomy. Carotid stenosis measurements (when applicable) are obtained utilizing NASCET criteria, using the distal internal carotid diameter as the denominator. CONTRAST:  50 cc of Isovue 370. COMPARISON:  Prior head CT from earlier the same day. FINDINGS: CTA NECK FINDINGS Aortic arch: Visualized aortic arch of normal caliber with normal branch pattern. No high-grade stenosis about the origin of the great vessels. Visualized subclavian artery is widely patent. Right carotid system: Right common carotid artery patent from its origin to the bifurcation. No significant atheromatous plaque about the right bifurcation. Right ICA widely patent in the bifurcation to the skullbase without stenosis, dissection, or occlusion. Left carotid system: Left common carotid artery patent from its origin to the bifurcation without stenosis. No significant atheromatous narrowing about the left bifurcation. Left ICA widely  patent from the bifurcation to the skullbase without stenosis, dissection, or occlusion. Vertebral arteries: Both of the vertebral arteries arise from the subclavian arteries. Left vertebral artery slightly dominant. Both of the vertebral arteries widely patent within the neck without stenosis, dissection, or occlusion. Skeleton: No acute osseus abnormality. No worrisome lytic or blastic osseous lesions. Mild degenerate spondylolysis noted at C5-6. Other neck: No acute soft tissue abnormality within the neck. Endotracheal tube in place. Thyroid normal. No adenopathy. Upper chest: Atelectatic changes present within the partially visualized lungs, right greater than left. Remainder the visualized lungs are otherwise clear. Review of the MIP images confirms the above findings CTA HEAD FINDINGS Anterior circulation: Petrous segments grossly patent, although evaluation limited by timing of the contrast bolus. Cavernous and supraclinoid ICA is patent without flow-limiting stenosis. ICA termini widely patent. A1 segments patent. Right A1 segment slightly hypoplastic. Anterior communicating artery not well seen. Left anterior cerebral artery patent to its distal aspect. Right anterior cerebral artery hypoplastic and not well visualize, but felt to likely  be patent to its distal aspect is well. M1 segments patent without stenosis or occlusion. No proximal M2 occlusion distal MCA branches well opacified and symmetric. Posterior circulation: Vertebral artery is patent to the vertebrobasilar junction without stenosis. Posterior inferior cerebral arteries patent proximally. Basilar artery widely patent. Superior cerebral arteries patent bilaterally. Both of the posterior cerebral artery supplied via the basilar artery and are patent to their distal aspects. Venous sinuses: Patent. Anatomic variants: No significant anatomic variant. No aneurysm or vascular malformation. Delayed phase: Not performed. Review of the MIP images  confirms the above findings IMPRESSION: 1. Negative CTA for emergent large vessel occlusion. No high-grade or correctable stenosis identified. 2. Endotracheal tube in place with atelectatic changes within the visualized lungs, right greater than left. Dr. Nicole Kindred of the Stroke Neurology service has been paged regarding these results at approximately 9:26 p.m. on 10/16/2016. Currently awaiting a call back. Addendum will be made once results were conveyed. Electronically Signed   By: Jeannine Boga M.D.   On: 10/16/2016 21:32   Dg Chest Port 1 View  Result Date: 10/16/2016 CLINICAL DATA:  ETT EXAM: PORTABLE CHEST 1 VIEW COMPARISON:  None. FINDINGS: Endotracheal tube tip is approximately 2.2 cm superior to the carina. There are low lung volumes. The heart size is upper limits of normal. Prominent upper mediastinum with vague opacity in the right suprahilar region. No pneumothorax. IMPRESSION: 1. Endotracheal tube tip about 2.2 cm superior to carina 2. Low lung volumes. Vague right suprahilar and paramediastinal opacity, cannot exclude infiltrate or mass. Consider CT for further evaluation. Electronically Signed   By: Donavan Foil M.D.   On: 10/16/2016 20:39   Ct Head Code Stroke W/o Cm  Result Date: 10/16/2016 CLINICAL DATA:  Code stroke. Initial evaluation for acute weakness, slurred speech. EXAM: CT HEAD WITHOUT CONTRAST TECHNIQUE: Contiguous axial images were obtained from the base of the skull through the vertex without intravenous contrast. COMPARISON:  None. FINDINGS: Brain: Cerebral volume normal. No acute intracranial hemorrhage. No evidence for acute large vessel territory infarct. No mass lesion, midline shift or mass effect. No hydrocephalus. No extra-axial fluid collection. Vascular: No hyperdense vessel. Skull: Scalp soft tissues and calvarium within normal limits. Sinuses/Orbits: Globes and orbital soft tissues normal. Visualized paranasal sinuses and mastoids are clear. ASPECTS [facility]  Stroke Program Early CT Score) - Ganglionic level infarction (caudate, lentiform nuclei, internal capsule, insula, M1-M3 cortex): 7 - Supraganglionic infarction (M4-M6 cortex): 3 Total score (0-10 with 10 being normal): 10 IMPRESSION: 1. No acute intracranial infarct or other process identified. 2. ASPECTS is 10 Critical Value/emergent results were called by telephone at the time of interpretation on 10/16/2016 at 9:07 pm to Dr. Nicole Kindred, who verbally acknowledged these results. Electronically Signed   By: Jeannine Boga M.D.   On: 10/16/2016 21:08        Scheduled Meds: . aspirin EC  81 mg Oral Daily  . buPROPion  300 mg Oral Daily  . pantoprazole  40 mg Oral Daily   Continuous Infusions: . sodium chloride 100 mL/hr at 10/17/16 0600  . levETIRAcetam 500 mg (10/17/16 0933)     LOS: 0 days     Cordelia Poche, MD [facility] 10/17/2016, 9:59 AM Pager: [phone]  If 7PM-7AM, please contact night-coverage [email or web address] Password [email or web address] 10/17/2016, 9:59 AM

## 2016-10-17 NOTE — Progress Notes (Signed)
MRI completed, revealing a small acute right parietal lobe ischemic infarction. Of note this is on the opposite side as would be expected based upon her presenting symptoms. Her CTA head and neck were negative for stenosis or occlusion. Her TTE showed normal systolic function and no mural thrombus.   Given her young age, a stroke in the young work up should be performed, including a hypercoagulable panel. Will need a TEE with bubble study to assess for PFO. Recent foot operation with bony fusion per husband is concerning for possible fat embolization via PFO as the underlying mechanism.  Discussed with family, who expressed understanding and agreement with the plan.   Electronically signed: Dr. Kerney Elbe

## 2016-10-18 DIAGNOSIS — R569 Unspecified convulsions: Secondary | ICD-10-CM

## 2016-10-18 DIAGNOSIS — Z8673 Personal history of transient ischemic attack (TIA), and cerebral infarction without residual deficits: Secondary | ICD-10-CM

## 2016-10-18 LAB — EXTRACTABLE NUCLEAR ANTIGEN ANTIBODY
DS DNA AB: 1 [IU]/mL (ref 0–9)
ENA SM Ab Ser-aCnc: <0.2 AI (ref 0.0–0.9)
Ribonucleic Protein: <0.2 AI (ref 0.0–0.9)
SSA (Ro) (ENA) Antibody, IgG: <0.2 AI (ref 0.0–0.9)
SSB (La) (ENA) Antibody, IgG: <0.2 AI (ref 0.0–0.9)
Scleroderma (Scl-70) (ENA) Antibody, IgG: <0.2 AI (ref 0.0–0.9)

## 2016-10-18 LAB — PROTEIN C ACTIVITY
PROTEIN C ACTIVITY: 98 % (ref 73–180)
Protein C Activity: 89 % (ref 73–180)

## 2016-10-18 LAB — LUPUS ANTICOAGULANT PANEL
DRVVT: 41.2 s (ref 0.0–47.0)
DRVVT: 38 s (ref 0.0–47.0)
PTT LA: 35.9 s (ref 0.0–51.9)
PTT Lupus Anticoagulant: 37.5 s (ref 0.0–51.9)

## 2016-10-18 LAB — PROTEIN S ACTIVITY
PROTEIN S ACTIVITY: 106 % (ref 63–140)
Protein S Activity: 103 % (ref 63–140)

## 2016-10-18 LAB — ANTI-JO 1 ANTIBODY, IGG

## 2016-10-18 LAB — PROTEIN S, TOTAL
PROTEIN S AG TOTAL: 89 % (ref 60–150)
Protein S Ag, Total: 86 % (ref 60–150)

## 2016-10-18 LAB — HEMOGLOBIN A1C
HEMOGLOBIN A1C: 5.4 % (ref 4.8–5.6)
MEAN PLASMA GLUCOSE: 108 mg/dL

## 2016-10-18 MED ORDER — ATORVASTATIN CALCIUM 40 MG PO TABS
40.0000 mg | ORAL_TABLET | Freq: Every day | ORAL | Status: DC
  Administered 2016-10-18: 40 mg via ORAL
  Filled 2016-10-18: qty 1

## 2016-10-18 MED ORDER — LEVETIRACETAM 500 MG PO TABS: 500.0000 mg | ORAL_TABLET | Freq: Two times a day (BID) | ORAL | 0 refills | Status: DC

## 2016-10-18 MED ORDER — ATORVASTATIN CALCIUM 40 MG PO TABS: 40.0000 mg | ORAL_TABLET | Freq: Every day | ORAL | 0 refills | Status: DC

## 2016-10-18 MED ORDER — ASPIRIN 81 MG PO TBEC: 81.0000 mg | DELAYED_RELEASE_TABLET | Freq: Every day | ORAL | 0 refills | Status: DC

## 2016-10-18 MED ORDER — LEVETIRACETAM 500 MG PO TABS
500.0000 mg | ORAL_TABLET | Freq: Two times a day (BID) | ORAL | Status: DC
  Administered 2016-10-18: 500 mg via ORAL
  Filled 2016-10-18: qty 1

## 2016-10-18 NOTE — Discharge Summary (Signed)
Physician Discharge Summary  Gwendolyn Torres PIR:[id number] DOB: 27-Feb-1971 DOA: 10/16/2016  PCP: Einar Pheasant, MD  Admit date: 10/16/2016 Discharge date: 10/21/2016  Admitted From: Home Disposition: Home  Recommendations for Outpatient Follow-up:  1. Follow up with PCP in 1 week 2. Follow up with neurology 3. Please obtain BMP/CBC in one week 4. Please follow up on the following pending results: Hypercoagulable labs  Home Health: None Equipment/Devices: None  Discharge Condition: Stable CODE STATUS: Full code Diet recommendation: Heart healthy   Brief/Interim Summary:  Admission HPI written by Lily Kocher, MD   Patient coming from: Home via EMS  Chief Complaint: Right sided facial droop and weakness  HPI: Gwendolyn Torres is a 46 y.o. woman with a history of GERD (on prilosec 20mg  daily) and mood disturbance (she is on Wellbutrin) with a recent right ankle surgery approximately three weeks ago (she has strict NONweightbearing orders from ortho per her husband) who was in her baseline state of health until she developed right sided facial droop then right sided weakness while having dinner with her husband.  He called 911 immediately.  The patient was transported as a Code Stroke.  While en route, the patient became unresponsive, and she had not received any medications from EMS.  Upon arrival here, she was posturing and was found to have unequal pupils.  She vomited undigested food.  She was intubated for airway protection.  STAT head CT was negative for acute process.  She eventually received 1mg  of IV narcan, and she seemed to return to baseline.  She was extubated.  At the time of my assessment, she recalls feeling dizzy and anxious prior to the onset of her symptoms at home.  No chest pain, shortness of breath, nausea, vomiting, LUTS, diarrhea, or fever.  No recent issues with disequilibrium.  She has been using a knee scooter to get around.  No tick exposure.  Appetite has been  normal.  ED Course: Head CT negative.  EtOH level negative.  Pregnancy test negative.  BUN 21.  Creatinine 0.9.  Troponin negative.  EKG shows sinus tachycardia.  Chest xray concerning for right suprahilar/mediastinal opacity.  CTA of head and neck also done.  Upper lung fields partially visualized on this study and now acute findings were seen.   The patient was loaded with Keppra 1000mg  IV x one.  Hospitalist asked to admit.    Hospital course:  Seizure Initial episode concerning for possible seizure. EEG was negative for seizure. Patient was started on Keppra and will continue on discharge. She has follow-up with neurology as an outpatient.  Acute ischemic infarct Right parietal stroke on MRI. Neurology and stroke team were consulted. Stroke located on same side as deficits. This is improved. Hypercoagulable panel obtained and pending at discharge. Hemoglobin A1c of 5.4% and LDL of 105. Patient started on aspirin and atorvastatin.  Right-sided weakness Right-sided facial droop Resolved. Does not seem explained by location of infarct.  GERD Continued PPI  Discharge Diagnoses:  Principal Problem:   Seizure [facility]) Active Problems:   Right sided weakness   Unresponsive episode   Acute ischemic stroke [facility])    Discharge Instructions  Discharge Instructions    Ambulatory referral to Neurology    Complete by:  As directed    An appointment is requested in approximately: 8 weeks   Call MD for:  difficulty breathing, headache or visual disturbances    Complete by:  As directed    Call MD for:  persistant dizziness or  light-headedness    Complete by:  As directed    Call MD for:  persistant nausea and vomiting    Complete by:  As directed    Diet - low sodium heart healthy    Complete by:  As directed    Increase activity slowly    Complete by:  As directed      Allergies as of 10/18/2016      Reactions   Penicillins Rash   Sulfa Antibiotics Rash      Medication  List    TAKE these medications   aspirin 81 MG EC tablet Take 1 tablet (81 mg total) by mouth daily.   atorvastatin 40 MG tablet Commonly known as:  LIPITOR Take 1 tablet (40 mg total) by mouth daily at 6 PM.   buPROPion 300 MG 24 hr tablet Commonly known as:  WELLBUTRIN XL TAKE 1 TABLET (300 MG TOTAL) BY MOUTH DAILY.   HYDROcodone-acetaminophen 7.5-325 MG tablet Commonly known as:  NORCO Take 1 tablet by mouth every 6 (six) hours as needed for moderate pain.   ibuprofen 200 MG tablet Commonly known as:  ADVIL,MOTRIN Take 400-600 mg by mouth every 6 (six) hours as needed for headache or moderate pain.   levETIRAcetam 500 MG tablet Commonly known as:  KEPPRA Take 1 tablet (500 mg total) by mouth 2 (two) times daily.   omeprazole 20 MG capsule Commonly known as:  PRILOSEC Take 20 mg by mouth daily.      Follow-up Information    Garvin Fila, MD. Schedule an appointment as soon as possible for a visit in 2 month(s).   Specialties:  Neurology, Radiology Why:  Office should call you.  Contact information: [address] [place] [address] [phone]        [facility]. Schedule an appointment as soon as possible for a visit in 1 week(s).   Why:  Transesophageal echo Contact information: [place] [address] [phone]         Allergies  Allergen Reactions  . Penicillins Rash  . Sulfa Antibiotics Rash    Consultations:  Neurology   Procedures/Studies: Ct Angio Head W Or Wo Contrast  Result Date: 10/16/2016 CLINICAL DATA:  Initial evaluation for acute stroke. EXAM: CT ANGIOGRAPHY HEAD AND NECK TECHNIQUE: Multidetector CT imaging of the head and neck was performed using the standard protocol during bolus administration of intravenous contrast. Multiplanar CT image reconstructions and MIPs were obtained to evaluate the vascular anatomy. Carotid  stenosis measurements (when applicable) are obtained utilizing NASCET criteria, using the distal internal carotid diameter as the denominator. CONTRAST:  50 cc of Isovue 370. COMPARISON:  Prior head CT from earlier the same day. FINDINGS: CTA NECK FINDINGS Aortic arch: Visualized aortic arch of normal caliber with normal branch pattern. No high-grade stenosis about the origin of the great vessels. Visualized subclavian artery is widely patent. Right carotid system: Right common carotid artery patent from its origin to the bifurcation. No significant atheromatous plaque about the right bifurcation. Right ICA widely patent in the bifurcation to the skullbase without stenosis, dissection, or occlusion. Left carotid system: Left common carotid artery patent from its origin to the bifurcation without stenosis. No significant atheromatous narrowing about the left bifurcation. Left ICA widely patent from the bifurcation to the skullbase without stenosis, dissection, or occlusion. Vertebral arteries: Both of the vertebral arteries arise from the subclavian arteries. Left vertebral artery slightly dominant. Both of the  vertebral arteries widely patent within the neck without stenosis, dissection, or occlusion. Skeleton: No acute osseus abnormality. No worrisome lytic or blastic osseous lesions. Mild degenerate spondylolysis noted at C5-6. Other neck: No acute soft tissue abnormality within the neck. Endotracheal tube in place. Thyroid normal. No adenopathy. Upper chest: Atelectatic changes present within the partially visualized lungs, right greater than left. Remainder the visualized lungs are otherwise clear. Review of the MIP images confirms the above findings CTA HEAD FINDINGS Anterior circulation: Petrous segments grossly patent, although evaluation limited by timing of the contrast bolus. Cavernous and supraclinoid ICA is patent without flow-limiting stenosis. ICA termini widely patent. A1 segments patent. Right A1  segment slightly hypoplastic. Anterior communicating artery not well seen. Left anterior cerebral artery patent to its distal aspect. Right anterior cerebral artery hypoplastic and not well visualize, but felt to likely be patent to its distal aspect is well. M1 segments patent without stenosis or occlusion. No proximal M2 occlusion distal MCA branches well opacified and symmetric. Posterior circulation: Vertebral artery is patent to the vertebrobasilar junction without stenosis. Posterior inferior cerebral arteries patent proximally. Basilar artery widely patent. Superior cerebral arteries patent bilaterally. Both of the posterior cerebral artery supplied via the basilar artery and are patent to their distal aspects. Venous sinuses: Patent. Anatomic variants: No significant anatomic variant. No aneurysm or vascular malformation. Delayed phase: Not performed. Review of the MIP images confirms the above findings IMPRESSION: 1. Negative CTA for emergent large vessel occlusion. No high-grade or correctable stenosis identified. 2. Endotracheal tube in place with atelectatic changes within the visualized lungs, right greater than left. Dr. Nicole Kindred of the Stroke Neurology service has been paged regarding these results at approximately 9:26 p.m. on 10/16/2016. Currently awaiting a call back. Addendum will be made once results were conveyed. Electronically Signed   By: Jeannine Boga M.D.   On: 10/16/2016 21:32   Ct Angio Neck W Or Wo Contrast  Result Date: 10/16/2016 CLINICAL DATA:  Initial evaluation for acute stroke. EXAM: CT ANGIOGRAPHY HEAD AND NECK TECHNIQUE: Multidetector CT imaging of the head and neck was performed using the standard protocol during bolus administration of intravenous contrast. Multiplanar CT image reconstructions and MIPs were obtained to evaluate the vascular anatomy. Carotid stenosis measurements (when applicable) are obtained utilizing NASCET criteria, using the distal internal  carotid diameter as the denominator. CONTRAST:  50 cc of Isovue 370. COMPARISON:  Prior head CT from earlier the same day. FINDINGS: CTA NECK FINDINGS Aortic arch: Visualized aortic arch of normal caliber with normal branch pattern. No high-grade stenosis about the origin of the great vessels. Visualized subclavian artery is widely patent. Right carotid system: Right common carotid artery patent from its origin to the bifurcation. No significant atheromatous plaque about the right bifurcation. Right ICA widely patent in the bifurcation to the skullbase without stenosis, dissection, or occlusion. Left carotid system: Left common carotid artery patent from its origin to the bifurcation without stenosis. No significant atheromatous narrowing about the left bifurcation. Left ICA widely patent from the bifurcation to the skullbase without stenosis, dissection, or occlusion. Vertebral arteries: Both of the vertebral arteries arise from the subclavian arteries. Left vertebral artery slightly dominant. Both of the vertebral arteries widely patent within the neck without stenosis, dissection, or occlusion. Skeleton: No acute osseus abnormality. No worrisome lytic or blastic osseous lesions. Mild degenerate spondylolysis noted at C5-6. Other neck: No acute soft tissue abnormality within the neck. Endotracheal tube in place. Thyroid normal. No adenopathy. Upper chest: Atelectatic changes present  within the partially visualized lungs, right greater than left. Remainder the visualized lungs are otherwise clear. Review of the MIP images confirms the above findings CTA HEAD FINDINGS Anterior circulation: Petrous segments grossly patent, although evaluation limited by timing of the contrast bolus. Cavernous and supraclinoid ICA is patent without flow-limiting stenosis. ICA termini widely patent. A1 segments patent. Right A1 segment slightly hypoplastic. Anterior communicating artery not well seen. Left anterior cerebral artery  patent to its distal aspect. Right anterior cerebral artery hypoplastic and not well visualize, but felt to likely be patent to its distal aspect is well. M1 segments patent without stenosis or occlusion. No proximal M2 occlusion distal MCA branches well opacified and symmetric. Posterior circulation: Vertebral artery is patent to the vertebrobasilar junction without stenosis. Posterior inferior cerebral arteries patent proximally. Basilar artery widely patent. Superior cerebral arteries patent bilaterally. Both of the posterior cerebral artery supplied via the basilar artery and are patent to their distal aspects. Venous sinuses: Patent. Anatomic variants: No significant anatomic variant. No aneurysm or vascular malformation. Delayed phase: Not performed. Review of the MIP images confirms the above findings IMPRESSION: 1. Negative CTA for emergent large vessel occlusion. No high-grade or correctable stenosis identified. 2. Endotracheal tube in place with atelectatic changes within the visualized lungs, right greater than left. Dr. Nicole Kindred of the Stroke Neurology service has been paged regarding these results at approximately 9:26 p.m. on 10/16/2016. Currently awaiting a call back. Addendum will be made once results were conveyed. Electronically Signed   By: Jeannine Boga M.D.   On: 10/16/2016 21:32   Mr Jeri Cos HA Contrast  Result Date: 10/17/2016 CLINICAL DATA:  Right-sided weakness, nausea, and vomiting. EXAM: MRI HEAD WITHOUT AND WITH CONTRAST TECHNIQUE: Multiplanar, multiecho pulse sequences of the brain and surrounding structures were obtained without and with intravenous contrast. CONTRAST:  20 mL MultiHance COMPARISON:  Head CT and CTA 10/16/2016 FINDINGS: Brain: There is a 1 cm acute cortical infarct in the anterior right parietal lobe. There is a trace amount of sulcal FLAIR signal abnormality adjacent to the infarct without susceptibility artifact to suggest blood products, and this may be  vascular. No abnormal brain parenchymal enhancement is identified, nor is there abnormal leptomeningeal enhancement to suggest meningitis. The brain is otherwise normal in signal. The ventricles are normal in size. There is an 8 x 5 mm extra-axial enhancing focus over the posterior left frontal convexity (series 12, image 40) without mass effect or brain edema. No midline shift or extra-axial fluid collection is present. Vascular: Major intracranial vascular flow voids are preserved. Skull and upper cervical spine: Unremarkable bone marrow signal. Sinuses/Orbits: Unremarkable orbits. Paranasal sinuses and mastoid air cells are clear. Other: None. IMPRESSION: 1. 1 cm acute right parietal cortical infarct. 2. 8 mm extra-axial enhancing focus over the posterior left frontal lobe, likely an incidental meningioma. Electronically Signed   By: Logan Bores M.D.   On: 10/17/2016 11:33   Dg Chest Port 1 View  Result Date: 10/16/2016 CLINICAL DATA:  ETT EXAM: PORTABLE CHEST 1 VIEW COMPARISON:  None. FINDINGS: Endotracheal tube tip is approximately 2.2 cm superior to the carina. There are low lung volumes. The heart size is upper limits of normal. Prominent upper mediastinum with vague opacity in the right suprahilar region. No pneumothorax. IMPRESSION: 1. Endotracheal tube tip about 2.2 cm superior to carina 2. Low lung volumes. Vague right suprahilar and paramediastinal opacity, cannot exclude infiltrate or mass. Consider CT for further evaluation. Electronically Signed   By: Donavan Foil  M.D.   On: 10/16/2016 20:39   Ct Head Code Stroke W/o Cm  Result Date: 10/16/2016 CLINICAL DATA:  Code stroke. Initial evaluation for acute weakness, slurred speech. EXAM: CT HEAD WITHOUT CONTRAST TECHNIQUE: Contiguous axial images were obtained from the base of the skull through the vertex without intravenous contrast. COMPARISON:  None. FINDINGS: Brain: Cerebral volume normal. No acute intracranial hemorrhage. No evidence for  acute large vessel territory infarct. No mass lesion, midline shift or mass effect. No hydrocephalus. No extra-axial fluid collection. Vascular: No hyperdense vessel. Skull: Scalp soft tissues and calvarium within normal limits. Sinuses/Orbits: Globes and orbital soft tissues normal. Visualized paranasal sinuses and mastoids are clear. ASPECTS [facility] Stroke Program Early CT Score) - Ganglionic level infarction (caudate, lentiform nuclei, internal capsule, insula, M1-M3 cortex): 7 - Supraganglionic infarction (M4-M6 cortex): 3 Total score (0-10 with 10 being normal): 10 IMPRESSION: 1. No acute intracranial infarct or other process identified. 2. ASPECTS is 10 Critical Value/emergent results were called by telephone at the time of interpretation on 10/16/2016 at 9:07 pm to Dr. Nicole Kindred, who verbally acknowledged these results. Electronically Signed   By: Jeannine Boga M.D.   On: 10/16/2016 21:08    Echocardiogram (6/15)  Study Conclusions  - Left ventricle: The cavity size was normal. Wall thickness was   normal. Systolic function was normal. The estimated ejection   fraction was in the range of 60% to 65%. Wall motion was normal;   there were no regional wall motion abnormalities. Left   ventricular diastolic function parameters were normal. - Aortic valve: Not well seen likely tri leaflet. - Left atrium: The atrium was moderately dilated. - Atrial septum: No defect or patent foramen ovale was identified.    Subjective: Patient reports some mild double vision when looking left which has significant improved. No weakness. No chest pain or dyspnea. No seizures overnight.  Discharge Exam: Vitals:   10/18/16 1346 10/18/16 1541  BP: 106/63 113/69  Pulse: 79 80  Resp: 10 16  Temp: 98.3 F (36.8 C) 98.2 F (36.8 C)   Vitals:   10/18/16 0300 10/18/16 0700 10/18/16 1346 10/18/16 1541  BP: 119/65 123/66 106/63 113/69  Pulse: 92 90 79 80  Resp: 15 16 10 16   Temp: 98.3 F (36.8 C) 98  F (36.7 C) 98.3 F (36.8 C) 98.2 F (36.8 C)  TempSrc: Oral Oral Oral Oral  SpO2: 96% 97% 100% 98%  Weight:      Height:        General: Pt is alert, awake, not in acute distress Cardiovascular: RRR, S1/S2 +, no rubs, no gallops Respiratory: CTA bilaterally, no wheezing, no rhonchi Abdominal: Soft, NT, ND, bowel sounds + Extremities: no edema, no cyanosis    The results of significant diagnostics from this hospitalization (including imaging, microbiology, ancillary and laboratory) are listed below for reference.     Microbiology: Recent Results (from the past 240 hour(s))  MRSA PCR Screening     Status: None   Collection Time: 10/16/16 11:56 PM  Result Value Ref Range Status   MRSA by PCR NEGATIVE NEGATIVE Final    Comment:        The GeneXpert MRSA Assay (FDA approved for NASAL specimens only), is one component of a comprehensive MRSA colonization surveillance program. It is not intended to diagnose MRSA infection nor to guide or monitor treatment for MRSA infections.      Labs: BNP (last 3 results) No results for input(s): BNP in the last 8760 hours. Basic Metabolic  Panel:  Recent Labs Lab 10/16/16 2011 10/16/16 2038  NA 133* 135  K 3.9 4.9  CL 101 102  CO2 24  --   GLUCOSE 109* 109*  BUN 21* 26*  CREATININE 0.90 0.90  CALCIUM 8.8*  --    Liver Function Tests:  Recent Labs Lab 10/16/16 2011  AST 26  ALT 23  ALKPHOS 75  BILITOT 0.2*  PROT 6.5  ALBUMIN 3.4*   CBC:  Recent Labs Lab 10/16/16 2011 10/16/16 2038  WBC 9.7  --   NEUTROABS 5.7  --   HGB 11.2* 11.9*  HCT 34.0* 35.0*  MCV 84.2  --   PLT 274  --    BNP: Invalid input(s): POCBNP CBG:  Recent Labs Lab 10/16/16 2013  GLUCAP 107*   Urinalysis    Component Value Date/Time   COLORURINE YELLOW 10/17/2016 [place] 10/17/2016 0643   LABSPEC 1.033 (H) 10/17/2016 0643   PHURINE 5.0 10/17/2016 [place] 10/17/2016 0643   HGBUR NEGATIVE  10/17/2016 0643   BILIRUBINUR NEGATIVE 10/17/2016 [place] 10/17/2016 0643   PROTEINUR NEGATIVE 10/17/2016 0643   NITRITE NEGATIVE 10/17/2016 0643   LEUKOCYTESUR SMALL (A) 10/17/2016 0643   Sepsis Labs Invalid input(s): PROCALCITONIN,  WBC,  LACTICIDVEN Microbiology Recent Results (from the past 240 hour(s))  MRSA PCR Screening     Status: None   Collection Time: 10/16/16 11:56 PM  Result Value Ref Range Status   MRSA by PCR NEGATIVE NEGATIVE Final    Comment:        The GeneXpert MRSA Assay (FDA approved for NASAL specimens only), is one component of a comprehensive MRSA colonization surveillance program. It is not intended to diagnose MRSA infection nor to guide or monitor treatment for MRSA infections.      Time coordinating discharge: Over 30 minutes  SIGNED:   Cordelia Poche, MD [facility] 10/21/2016, 5:01 PM Pager [phone]  If 7PM-7AM, please contact night-coverage [email or web address] Password [email or web address]

## 2016-10-18 NOTE — Discharge Instructions (Signed)
Gwendolyn Torres,  You were admitted because of seizure and found to have a stroke. Workup has been performed, however, you also need a Transesophageal echocardiogram. Please follow-up with cardiology (someone should call you to schedule); their number has been provided.

## 2016-10-18 NOTE — Plan of Care (Signed)
Problem: Education: Goal: Knowledge of [facility] Education information/materials will improve Outcome: Progressing Explained to patient need to call for needs and not try to get out of bed without help and unit routines. Patient verbalized understanding

## 2016-10-18 NOTE — Progress Notes (Signed)
STROKE TEAM PROGRESS NOTE   HISTORY OF PRESENT ILLNESS (per record) Gwendolyn Torres is an 46 y.o. female with a history of depression and GERD, brought to the ED and code stroke status following acute onset of lightheadedness, reported facial droop on the right and right-sided weakness which rapidly progressed to unresponsiveness with intermittent episodes of stiffening of upper and lower extremities bilaterally. She complained of double vision along with lightheadedness prior to becoming unresponsive. Blood sugar was 109. She has no history of syncope nor seizures. On arrival in the ED she was unresponsive except to noxious stimuli. Left pupil was 7 mm and right pupil was 4 mm, and neither reacted to light. Patient was electively intubated for airway protection. She was noted to have episodes of cerebral posturing prior to and after intubation. CT scan of her head was obtained which showed no acute intracranial abnormality. CT angiogram of her head and neck was also obtained which was also unremarkable. Following CT scan and CT angiogram patient was given 1 mg of Narcan and became progressively more responsive. She began to open eyes as well as follow commands. Movements of extremities were normal and symmetrical. She was subsequently extubated. She became completely alert and coherent with no focal deficits. Code stroke was canceled.        LSW: 7:10 PM on 10/16/2016 TPA: No: No clear focal deficits while unresponsive, no deficits after regaining consciousness.   SUBJECTIVE (INTERVAL HISTORY) She reports feeling well today. She is eager to go home. She indicates that she reports having some visual obscuration before she had her event. This is associated with right facial numbness that she became unresponsive. She tolerated that she had right ankle surgery done May 24 and only took 2 doses of hydrocodone. She took one that day after the procedure 1 the following day. She denies benzodiazepines  use.   OBJECTIVE Temp:  [97.8 F (36.6 C)-98.5 F (36.9 C)] 98 F (36.7 C) (06/16 0700) Pulse Rate:  [89-99] 90 (06/16 0700) Cardiac Rhythm: Normal sinus rhythm (06/16 0700) Resp:  [14-19] 16 (06/16 0700) BP: (116-130)/(65-71) 123/66 (06/16 0700) SpO2:  [96 %-98 %] 97 % (06/16 0700)  CBC:  Recent Labs Lab 10/16/16 2011 10/16/16 2038  WBC 9.7  --   NEUTROABS 5.7  --   HGB 11.2* 11.9*  HCT 34.0* 35.0*  MCV 84.2  --   PLT 274  --     Basic Metabolic Panel:  Recent Labs Lab 10/16/16 2011 10/16/16 2038  NA 133* 135  K 3.9 4.9  CL 101 102  CO2 24  --   GLUCOSE 109* 109*  BUN 21* 26*  CREATININE 0.90 0.90  CALCIUM 8.8*  --     Lipid Panel:    Component Value Date/Time   CHOL 170 10/17/2016 0039   TRIG 71 10/17/2016 0039   HDL 51 10/17/2016 0039   CHOLHDL 3.3 10/17/2016 0039   VLDL 14 10/17/2016 0039   LDLCALC 105 (H) 10/17/2016 0039   HgbA1c:  Lab Results  Component Value Date   HGBA1C 5.4 10/17/2016   Urine Drug Screen:    Component Value Date/Time   LABOPIA POSITIVE (A) 10/17/2016 0643   COCAINSCRNUR NONE DETECTED 10/17/2016 0643   LABBENZ POSITIVE (A) 10/17/2016 0643   AMPHETMU NONE DETECTED 10/17/2016 0643   THCU NONE DETECTED 10/17/2016 0643   LABBARB NONE DETECTED 10/17/2016 0643    Alcohol Level     Component Value Date/Time   ETH <5 10/16/2016 2010  IMAGING  Ct Angio Head W Or Wo Contrast Ct Angio Neck W Or Wo Contrast 10/16/2016 1. Negative CTA for emergent large vessel occlusion. No high-grade or correctable stenosis identified.  2. Endotracheal tube in place with atelectatic changes within the visualized lungs, right greater than left.     Mr Gwendolyn Torres Wo Contrast 10/17/2016 1. 1 cm acute right parietal cortical infarct.  2. 8 mm extra-axial enhancing focus over the posterior left frontal lobe, likely an incidental meningioma.     Dg Chest Port 1 View 10/16/2016 1. Endotracheal tube tip about 2.2 cm superior to carina  2. Low  lung volumes. Vague right suprahilar and paramediastinal opacity, cannot exclude infiltrate or mass.   Consider CT for further evaluation.    Ct Head Code Stroke W/o Cm 10/16/2016 1. No acute intracranial infarct or other process identified.  2. ASPECTS is 10    EEG 10/17/2016 Impression: This awake and asleep EEG is normal.   Clinical Correlation: A normal EEG does not exclude a clinical diagnosis of epilepsy.  If further clinical questions remain, prolonged EEG may be helpful.  Clinical correlation is advised.    PHYSICAL EXAM  GENERAL: Pleasant obese female in no acute distress.  HEENT: Normal  ABDOMEN: soft  EXTREMITIES: No edema   SKIN: Normal by inspection.    MENTAL STATUS: Alert and oriented. Speech, language and cognition are generally intact. Judgment and insight normal.   CRANIAL NERVES: Pupils are equal, round and reactive to light and accomodation; extra ocular movements are full, there is no significant nystagmus; visual fields are full; upper and lower facial muscles are normal in strength and symmetric, there is no flattening of the nasolabial folds; tongue is midline; uvula is midline; shoulder elevation is normal. Visual fields are intact.  MOTOR: Normal tone, bulk and strength; no pronator drift.  COORDINATION: Left finger to nose is normal, right finger to nose is normal, No rest tremor; no intention tremor; no postural tremor; no bradykinesia.         ASSESSMENT/PLAN [name] is a 46 y.o. female with history of depression, gastroesophageal reflux disease, and possible substance abuse  presenting with lightheadedness, facial droop, right-sided weakness, and subsequent unresponsiveness. She did not receive IV t-PA due to no clear focal deficits.  The patient will be kept on Keppra as she may have had a seizure given the small meningioma involving the left frontal region which could correspond to the right facial symptoms and the patient losing  consciousness.  Stroke:  1 cm acute right parietal cortical infarct. Etiology unclear. - possibly embolic.  Resultant  None  CT head - no acute abnormality  MRI head -  1 cm acute right parietal cortical infarct.   MRA head - not performed.  CTA H&N - unremarkable  Carotid Doppler - CTA neck  2D Echo - EF 60-65%. No cardiac source of emboli identified.  LDL - 105  EEG - normal - now on Keppra 500 mg twice daily.  HgbA1c - 5.4  VTE prophylaxis - SCDs  Diet regular Room service appropriate? Yes; Fluid consistency: Thin  No antithrombotic prior to admission, now on aspirin 81 mg daily  Patient counseled to be compliant with her antithrombotic medications  Ongoing aggressive stroke risk factor management  Therapy recommendations: pending  Disposition:  Pending  Hypertension  Stable  Permissive hypertension (OK if < 220/120) but gradually normalize in 5-7 days  Long-term BP goal normotensive  Hyperlipidemia  Home meds: No lipid lowering  medications prior to admission  LDL 105, goal < 70  Now on Lipitor 40 mg daily  Continue statin at discharge   Other Stroke Risk Factors  Obesity, Body mass index is 41.85 kg/m., recommend weight loss, diet and exercise as appropriate    Other Active Problems  Possible substance abuse - UDS positive for opiates and benzodiazepines - history of depression.  Mild anemia  Mildly elevated BUN - question dehydration  Now on Keppra - EEG - normal - cerebral posturing at time of admission and meningioma.   Continue  Keppra for now.  Schedule outpatient TEE. Cannot be done in hospital until 6/20 (4 days) secondary to full schedule. Message  sent to cardiology.  Hypercoagulable labs pending.  F/u Dr Leonie Man (ordered)  Hospital day # 1    To contact Stroke Continuity provider, please refer to [email or web address] After hours, contact General Neurology

## 2016-10-19 LAB — BETA-2-GLYCOPROTEIN I ABS, IGG/M/A: Beta-2-Glycoprotein I IgA: <9 GPI IgA units (ref 0–25)

## 2016-10-19 LAB — CARDIOLIPIN ANTIBODIES, IGG, IGM, IGA
Anticardiolipin IgG: <9 GPL U/mL (ref 0–14)
Anticardiolipin IgM: <9 MPL U/mL (ref 0–12)

## 2016-10-19 LAB — PROTEIN C, TOTAL: Protein C, Total: 67 % (ref 60–150)

## 2016-10-20 LAB — CARDIOLIPIN ANTIBODIES, IGG, IGM, IGA
Anticardiolipin IgA: <9 APL U/mL (ref 0–11)
Anticardiolipin IgG: <9 GPL U/mL (ref 0–14)
Anticardiolipin IgM: <9 MPL U/mL (ref 0–12)

## 2016-10-20 LAB — PROTEIN C, TOTAL: PROTEIN C, TOTAL: 65 % (ref 60–150)

## 2016-10-20 LAB — HOMOCYSTEINE
Homocysteine: 11.7 umol/L (ref 0.0–15.0)
Homocysteine: 9.1 umol/L (ref 0.0–15.0)

## 2016-10-21 ENCOUNTER — Other Ambulatory Visit: Payer: Self-pay | Admitting: Internal Medicine

## 2016-10-21 LAB — FACTOR 5 LEIDEN

## 2016-10-21 LAB — PROTHROMBIN GENE MUTATION

## 2016-10-22 LAB — FACTOR 5 LEIDEN

## 2016-10-27 ENCOUNTER — Other Ambulatory Visit: Payer: Self-pay | Admitting: Cardiology

## 2016-10-27 DIAGNOSIS — I639 Cerebral infarction, unspecified: Secondary | ICD-10-CM

## 2016-10-28 ENCOUNTER — Encounter
Admission: RE | Disposition: A | Discharge status: Home / Self Care | Payer: Self-pay | Source: Ambulatory Visit | Attending: Cardiology

## 2016-10-28 ENCOUNTER — Other Ambulatory Visit: Payer: Self-pay | Admitting: Internal Medicine

## 2016-10-28 ENCOUNTER — Encounter: Payer: Self-pay | Admitting: *Deleted

## 2016-10-28 ENCOUNTER — Encounter: Payer: Self-pay | Admitting: Certified Registered"

## 2016-10-28 ENCOUNTER — Ambulatory Visit
Admission: RE | Admit: 2016-10-28 | Discharge: 2016-10-28 | Disposition: A | Discharge status: Home / Self Care | Payer: 59 | Source: Ambulatory Visit | Attending: Cardiology | Admitting: Cardiology

## 2016-10-28 DIAGNOSIS — I6389 Other cerebral infarction: Secondary | ICD-10-CM

## 2016-10-28 DIAGNOSIS — I639 Cerebral infarction, unspecified: Secondary | ICD-10-CM | POA: Insufficient documentation

## 2016-10-28 DIAGNOSIS — D6851 Activated protein C resistance: Secondary | ICD-10-CM

## 2016-10-28 HISTORY — PX: TEE WITHOUT CARDIOVERSION: SHX5443

## 2016-10-28 SURGERY — ECHOCARDIOGRAM, TRANSESOPHAGEAL
Anesthesia: Moderate Sedation

## 2016-10-28 MED ORDER — FENTANYL CITRATE (PF) 100 MCG/2ML IJ SOLN
INTRAMUSCULAR | Status: AC | PRN
  Administered 2016-10-28: 25 ug via INTRAVENOUS
  Administered 2016-10-28: 50 ug via INTRAVENOUS
  Administered 2016-10-28: 25 ug via INTRAVENOUS

## 2016-10-28 MED ORDER — SODIUM CHLORIDE FLUSH 0.9 % IV SOLN
INTRAVENOUS | Status: AC
  Filled 2016-10-28: qty 10

## 2016-10-28 MED ORDER — MIDAZOLAM HCL 5 MG/5ML IJ SOLN
INTRAMUSCULAR | Status: AC
  Filled 2016-10-28: qty 10

## 2016-10-28 MED ORDER — FENTANYL CITRATE (PF) 100 MCG/2ML IJ SOLN
INTRAMUSCULAR | Status: AC
  Filled 2016-10-28: qty 4

## 2016-10-28 MED ORDER — SODIUM CHLORIDE 0.9 % IV SOLN: INTRAVENOUS | Status: DC

## 2016-10-28 MED ORDER — MIDAZOLAM HCL 5 MG/5ML IJ SOLN
INTRAMUSCULAR | Status: AC | PRN
  Administered 2016-10-28: 1 mg via INTRAVENOUS
  Administered 2016-10-28: 2 mg via INTRAVENOUS
  Administered 2016-10-28: 1 mg via INTRAVENOUS

## 2016-10-28 MED ORDER — LIDOCAINE VISCOUS 2 % MT SOLN
OROMUCOSAL | Status: AC
  Filled 2016-10-28: qty 15

## 2016-10-28 MED ORDER — BUTAMBEN-TETRACAINE-BENZOCAINE 2-2-14 % EX AERO
INHALATION_SPRAY | CUTANEOUS | Status: AC
  Filled 2016-10-28: qty 20

## 2016-10-28 NOTE — Progress Notes (Signed)
*  PRELIMINARY RESULTS* Echocardiogram Echocardiogram Transesophageal has been performed.  Gwendolyn Torres 10/28/2016, 8:10 AM

## 2016-10-28 NOTE — Procedures (Addendum)
   TRANSESOPHAGEAL ECHOCARDIOGRAM   NAME:  FRONIE HOLSTEIN   MRN: [id number] DOB:  [birth year]   ADMIT DATE: 10/28/2016  INDICATIONS:   PROCEDURE:   Informed consent was obtained prior to the procedure. The risks, benefits and alternatives for the procedure were discussed and the patient comprehended these risks.  Risks include, but are not limited to, cough, sore throat, vomiting, nausea, somnolence, esophageal and stomach trauma or perforation, bleeding, low blood pressure, aspiration, pneumonia, infection, trauma to the teeth and death.    After a procedural time-out, the patient was given 4 mg versed and 125 mcg fentanyl for moderate sedation.  The oropharynx was anesthetized 1 cc of topical 1% viscous lidocaine.  The transesophageal probe was inserted in the esophagus and stomach without difficulty and multiple views were obtained.    COMPLICATIONS:    There were no immediate complications.  FINDINGS:  LEFT VENTRICLE: EF = 55%. No regional wall motion abnormalities.  RIGHT VENTRICLE: Normal size and function.   LEFT ATRIUM: Normal size.   LEFT ATRIAL APPENDAGE: No thrombus.   RIGHT ATRIUM: normal  AORTIC VALVE:  Trileaflet.   MITRAL VALVE:    Normal.  TRICUSPID VALVE: Normal.  PULMONIC VALVE: Grossly normal.  INTERATRIAL SEPTUM: No PFO or ASD.Agitated saline contrast injected.  PERICARDIUM: No effusion  DESCENDING AORTA: Normal   CONCLUSION:   No evidence of cardiac source of emboli

## 2016-10-28 NOTE — Progress Notes (Signed)
Order placed for hematology referral.  

## 2016-10-29 ENCOUNTER — Encounter: Payer: Self-pay | Admitting: Cardiology

## 2016-10-30 ENCOUNTER — Encounter: Payer: Self-pay | Admitting: Internal Medicine

## 2016-10-30 ENCOUNTER — Ambulatory Visit (INDEPENDENT_AMBULATORY_CARE_PROVIDER_SITE_OTHER): Payer: 59 | Admitting: Internal Medicine

## 2016-10-30 VITALS — BP 110/78 | HR 95 | Temp 99.1°F | Resp 12 | Ht 67.0 in | Wt 258.0 lb

## 2016-10-30 DIAGNOSIS — F418 Other specified anxiety disorders: Secondary | ICD-10-CM | POA: Diagnosis not present

## 2016-10-30 DIAGNOSIS — D649 Anemia, unspecified: Secondary | ICD-10-CM | POA: Diagnosis not present

## 2016-10-30 DIAGNOSIS — Z6841 Body Mass Index (BMI) 40.0 and over, adult: Secondary | ICD-10-CM

## 2016-10-30 DIAGNOSIS — R4189 Other symptoms and signs involving cognitive functions and awareness: Secondary | ICD-10-CM

## 2016-10-30 DIAGNOSIS — R404 Transient alteration of awareness: Secondary | ICD-10-CM

## 2016-10-30 DIAGNOSIS — I639 Cerebral infarction, unspecified: Secondary | ICD-10-CM

## 2016-10-30 DIAGNOSIS — R569 Unspecified convulsions: Secondary | ICD-10-CM | POA: Diagnosis not present

## 2016-10-30 NOTE — Progress Notes (Signed)
Patient ID: Gwendolyn Torres, female   DOB: 02/28/1971, 46 y.o.   MRN: [id number]   Subjective:    Patient ID: Gwendolyn Torres, female    DOB: Apr 04, 1971, 46 y.o.   MRN: [id number]  HPI  Patient here for hospital follow up.  She is accompanied by her husband.  History obtained from both of them.  She was admitted 10/16/16 and diagnosed with acute ischemic infarct (right parietal stroke noted on MRI).  Also concern over possible seizure.  Was started on keppra.  Taking aspirin.  CTA 10/17/16 - negative.  Prescribed lipitor. Has not started yet.  Had TEE two days ago.  States everything checked out fine.   Had normal EEG.  Planning for sleep study.  Seeing Dr Manuella Ghazi and just saw Dr Ubaldo Glassing.  States after discharge, she noticed when she looked to the left - objets were double (one on top of the other).  This has resolved.  Having no residual symptoms now.  No weakness.  No confusion.  Feels back to her normal self.  No chest pain.  No sob.  No acid reflux.  No abdominal pain or cramping.  Bowels stable.  Discussed starting lipitor.     Past Medical History:  Diagnosis Date  . GERD (gastroesophageal reflux disease)    Past Surgical History:  Procedure Laterality Date  . BUNIONECTOMY Right 09/25/2016   Procedure: lapidus type  modified mcbride;  Surgeon: Albertine Patricia, DPM;  Location: [place];  Service: Podiatry;  Laterality: Right;  . CESAREAN SECTION  2004  . OSTECTOMY Right 09/25/2016   Procedure: OSTECTOMY RT  1SR,2ND 3RD  metatarsal;  Surgeon: Albertine Patricia, DPM;  Location: [place];  Service: Podiatry;  Laterality: Right;  LMA LOCAL  . TEE WITHOUT CARDIOVERSION N/A 10/28/2016   Procedure: TRANSESOPHAGEAL ECHOCARDIOGRAM (TEE);  Surgeon: Teodoro Spray, MD;  Location: [facility];  Service: Cardiovascular;  Laterality: N/A;   Family History  Problem Relation Age of Onset  . Aortic aneurysm Father        also has popliteal aneurysm  . Hypertension Father   . Hypercholesterolemia  Father   . Diabetes Father   . Aortic aneurysm Mother   . Hypercholesterolemia Mother   . Hypercholesterolemia Sister        x2  . Hyperlipidemia Brother   . Hypertension Sister   . Breast cancer Maternal Aunt   . Melanoma Maternal Uncle   . Colon cancer Neg Hx    Social History   Social History  . Marital status: Married    Spouse name: N/A  . Number of children: 1  . Years of education: N/A   Occupational History  . transcriptionist    Social History Main Topics  . Smoking status: Never Smoker  . Smokeless tobacco: Never Used  . Alcohol use No  . Drug use: No  . Sexual activity: Yes    Birth control/ protection: None   Other Topics Concern  . None   Social History Narrative  . None    Outpatient Encounter Prescriptions as of 10/30/2016  Medication Sig  . aspirin EC 81 MG EC tablet Take 1 tablet (81 mg total) by mouth daily.  Marland Kitchen buPROPion (WELLBUTRIN XL) 300 MG 24 hr tablet TAKE 1 TABLET (300 MG TOTAL) BY MOUTH DAILY.  Marland Kitchen levETIRAcetam (KEPPRA) 500 MG tablet Take 1 tablet (500 mg total) by mouth 2 (two) times daily.  Marland Kitchen omeprazole (PRILOSEC) 20 MG capsule Take 20 mg by mouth daily.  Marland Kitchen  atorvastatin (LIPITOR) 40 MG tablet Take 1 tablet (40 mg total) by mouth daily at 6 PM. (Patient not taking: Reported on 10/30/2016)  . [DISCONTINUED] HYDROcodone-acetaminophen (NORCO) 7.5-325 MG tablet Take 1 tablet by mouth every 6 (six) hours as needed for moderate pain. (Patient not taking: Reported on 10/17/2016)   No facility-administered encounter medications on file as of 10/30/2016.     Review of Systems  Constitutional: Negative for appetite change and unexpected weight change.  HENT: Negative for congestion and sinus pressure.   Eyes:       Previous double vision as outlined.  Resolved.    Respiratory: Negative for cough, chest tightness and shortness of breath.   Cardiovascular: Negative for chest pain, palpitations and leg swelling.  Gastrointestinal: Negative for  abdominal pain, diarrhea, nausea and vomiting.  Genitourinary: Negative for difficulty urinating and dysuria.  Musculoskeletal: Negative for back pain and joint swelling.  Skin: Negative for color change and rash.  Neurological: Negative for dizziness, light-headedness and headaches.  Psychiatric/Behavioral: Negative for agitation and dysphoric mood.       Objective:    Physical Exam  Constitutional: She appears well-developed and well-nourished. No distress.  HENT:  Nose: Nose normal.  Mouth/Throat: Oropharynx is clear and moist.  Neck: Neck supple. No thyromegaly present.  Cardiovascular: Normal rate and regular rhythm.   Pulmonary/Chest: Breath sounds normal. No respiratory distress. She has no wheezes.  Abdominal: Soft. Bowel sounds are normal. There is no tenderness.  Musculoskeletal: She exhibits no edema or tenderness.  Lymphadenopathy:    She has no cervical adenopathy.  Skin: No rash noted. No erythema.  Psychiatric: She has a normal mood and affect. Her behavior is normal.    BP 110/78 (BP Location: Right Arm, Patient Position: Sitting, Cuff Size: Normal)   Pulse 95   Temp 99.1 F (37.3 C) (Oral)   Resp 12   Ht 5\' 7"  (1.702 m)   Wt 258 lb (117 kg)   LMP 10/29/2016   SpO2 97%   BMI 40.41 kg/m  Wt Readings from Last 3 Encounters:  10/30/16 258 lb (117 kg)  10/28/16 258 lb (117 kg)  10/16/16 267 lb 3.2 oz (121.2 kg)     Lab Results  Component Value Date   WBC 9.7 10/16/2016   HGB 11.9 (L) 10/16/2016   HCT 35.0 (L) 10/16/2016   PLT 274 10/16/2016   GLUCOSE 109 (H) 10/16/2016   CHOL 170 10/17/2016   TRIG 71 10/17/2016   HDL 51 10/17/2016   LDLCALC 105 (H) 10/17/2016   ALT 23 10/16/2016   AST 26 10/16/2016   NA 135 10/16/2016   K 4.9 10/16/2016   CL 102 10/16/2016   CREATININE 0.90 10/16/2016   BUN 26 (H) 10/16/2016   CO2 24 10/16/2016   TSH 1.47 10/30/2015   INR 1.04 10/16/2016   HGBA1C 5.4 10/17/2016       Assessment & Plan:   Problem List  Items Addressed This Visit    BMI 40.0-44.9, adult ([place])    Diet and exercise.  She has been unable to exercise given recent foot surgery.  Plans to start soon.  Follow.        CVA (cerebral vascular accident) ([place])    Right parietal infarct noted on MRI.  Remains on aspirin.  Continue f/u with neurology.        Seizure [facility])    Admitted recently with unresponsive episode as outlined.  Started on keppra.  Had recent negative EEG.  Seeing Dr Manuella Ghazi.  Remain on keppra for now.        Situational anxiety    Doing well on wellbutrin.  Follow.       Unresponsive episode    Recently admitted.  Hospital records reviewed.  W/up as outlined in my note.  Remains on Keppra.  Right parietal stroke on MRI.  On aspirin.  Discussed taking lipitor.  She will start.  Current no residual signs or symptoms.  Seeing Dr Manuella Ghazi.  Planning for sleep study.  Recently saw Dr Ubaldo Glassing.  Negative TEE per report.  Follow closely.         Other Visit Diagnoses    Anemia, unspecified type    -  Primary       Einar Pheasant, MD

## 2016-10-30 NOTE — Progress Notes (Signed)
Pre-visit discussion using our clinic review tool. No additional management support is needed unless otherwise documented below in the visit note.  

## 2016-11-02 ENCOUNTER — Encounter: Payer: Self-pay | Admitting: Internal Medicine

## 2016-11-02 NOTE — Assessment & Plan Note (Signed)
Recently admitted.  Hospital records reviewed.  W/up as outlined in my note.  Remains on Keppra.  Right parietal stroke on MRI.  On aspirin.  Discussed taking lipitor.  She will start.  Current no residual signs or symptoms.  Seeing Dr Manuella Ghazi.  Planning for sleep study.  Recently saw Dr Ubaldo Glassing.  Negative TEE per report.  Follow closely.

## 2016-11-02 NOTE — Assessment & Plan Note (Signed)
Admitted recently with unresponsive episode as outlined.  Started on keppra.  Had recent negative EEG.  Seeing Dr Manuella Ghazi.  Remain on keppra for now.

## 2016-11-02 NOTE — Assessment & Plan Note (Signed)
Doing well on wellbutrin.  Follow.

## 2016-11-02 NOTE — Assessment & Plan Note (Signed)
Right parietal infarct noted on MRI.  Remains on aspirin.  Continue f/u with neurology.

## 2016-11-02 NOTE — Assessment & Plan Note (Signed)
Diet and exercise.  She has been unable to exercise given recent foot surgery.  Plans to start soon.  Follow.

## 2016-11-04 ENCOUNTER — Telehealth: Payer: Self-pay

## 2016-11-04 NOTE — Telephone Encounter (Signed)
Left vm for patient about scheduling appt for hospital follow up per Dr. Leonie Man request. From epic notes and care everywhere pt is being follow by Ray Church, MD neurologist in [place]. PT was seen in June 2018.

## 2016-11-04 NOTE — Telephone Encounter (Signed)
Patient has already seen neurologist at [facility]. She did not be scheduled to see me unless she wants a second opinion or is referred by her neurologist

## 2016-11-17 ENCOUNTER — Other Ambulatory Visit: Payer: Self-pay | Admitting: Internal Medicine

## 2016-11-17 NOTE — Telephone Encounter (Signed)
Pt called requesting a refill on her aspirin EC 81 MG EC tablet and atorvastatin (LIPITOR) 40 MG tablet. Please advise, thank you!  Pharmacy - [id number] - [place], [place]  Call pt @ [phone]

## 2016-11-18 ENCOUNTER — Encounter: Payer: Self-pay | Admitting: Internal Medicine

## 2016-11-18 MED ORDER — ATORVASTATIN CALCIUM 40 MG PO TABS: 40.0000 mg | ORAL_TABLET | Freq: Every day | ORAL | 0 refills | Status: DC

## 2016-11-18 MED ORDER — ASPIRIN 81 MG PO TBEC: 81.0000 mg | DELAYED_RELEASE_TABLET | Freq: Every day | ORAL | 0 refills | Status: DC

## 2016-11-19 ENCOUNTER — Other Ambulatory Visit: Payer: Self-pay | Admitting: Internal Medicine

## 2016-11-20 ENCOUNTER — Encounter: Payer: Self-pay | Admitting: Oncology

## 2016-11-20 ENCOUNTER — Inpatient Hospital Stay: Payer: 59 | Attending: Oncology | Admitting: Oncology

## 2016-11-20 VITALS — BP 139/96 | HR 84 | Temp 98.8°F | Resp 16 | Ht 67.0 in | Wt 257.3 lb

## 2016-11-20 DIAGNOSIS — Z880 Allergy status to penicillin: Secondary | ICD-10-CM

## 2016-11-20 DIAGNOSIS — M1990 Unspecified osteoarthritis, unspecified site: Secondary | ICD-10-CM | POA: Diagnosis not present

## 2016-11-20 DIAGNOSIS — D6851 Activated protein C resistance: Secondary | ICD-10-CM | POA: Diagnosis not present

## 2016-11-20 DIAGNOSIS — Z803 Family history of malignant neoplasm of breast: Secondary | ICD-10-CM

## 2016-11-20 DIAGNOSIS — Z8673 Personal history of transient ischemic attack (TIA), and cerebral infarction without residual deficits: Secondary | ICD-10-CM | POA: Diagnosis not present

## 2016-11-20 DIAGNOSIS — Z79899 Other long term (current) drug therapy: Secondary | ICD-10-CM | POA: Diagnosis not present

## 2016-11-20 DIAGNOSIS — Z7982 Long term (current) use of aspirin: Secondary | ICD-10-CM | POA: Diagnosis not present

## 2016-11-20 DIAGNOSIS — K219 Gastro-esophageal reflux disease without esophagitis: Secondary | ICD-10-CM

## 2016-11-20 NOTE — Progress Notes (Signed)
Hematology/Oncology Consult note [facility] - Inpatient Telephone:([phone] [phone]  Patient Care Team: Einar Pheasant, MD as PCP - General (Internal Medicine)   Name of the patient: Gwendolyn Torres  [id number]  10/04/1970    Reason for referral- factor V leiden mutation   Referring physician- Dr. Einar Pheasant  Date of visit: 11/20/16   History of presenting illness- patient is a 46 year old female who was recently diagnosed with right parietal stroke on 10/17/2016. She presented to the ER with symptoms of double vision and numbness over the right side of her cheek.she is currently on low dose aspirin and sees Dr. Manuella Ghazi from neurology. Patient had a complete hypercoagulable workup including protein C and, protein S, antithrombin III levels which were normal. Antiphospholipid antibody panel was negative. Prothrombin gene mutation was negative. Patient was found to be heterozygous for factor V Leiden. Patient also had a TEE which did not reveal any EF for intracardiac thrombus.  Patient has not had any prior history of DVT or PE. She has 1 daughter is 57 years of age and did not have any problems with her delivery. She has 5 other siblings and none of them have had any thrombotic episodes. No history of any thromboses in her parents.. She recently underwent a bunionectomy surgery involving her right foot and her stroke was about 3 weeks later  ECOG PS- 1  Pain scale- 0   Review of systems- Review of Systems  Constitutional: Negative for chills, fever, malaise/fatigue and weight loss.  HENT: Negative for congestion, ear discharge and nosebleeds.   Eyes: Negative for blurred vision.  Respiratory: Negative for cough, hemoptysis, sputum production, shortness of breath and wheezing.   Cardiovascular: Negative for chest pain, palpitations, orthopnea and claudication.  Gastrointestinal: Negative for abdominal pain, blood in stool, constipation, diarrhea, heartburn,  melena, nausea and vomiting.  Genitourinary: Negative for dysuria, flank pain, frequency, hematuria and urgency.  Musculoskeletal: Negative for back pain, joint pain and myalgias.  Skin: Negative for rash.  Neurological: Negative for dizziness, tingling, focal weakness, seizures, weakness and headaches.  Endo/Heme/Allergies: Does not bruise/bleed easily.  Psychiatric/Behavioral: Negative for depression and suicidal ideas. The patient does not have insomnia.     Allergies  Allergen Reactions  . Penicillins Rash    Has patient had a PCN reaction causing immediate rash, facial/tongue/throat swelling, SOB or lightheadedness with hypotension: Yes Has patient had a PCN reaction causing severe rash involving mucus membranes or skin necrosis: Unknown Has patient had a PCN reaction that required hospitalization: No Has patient had a PCN reaction occurring within the last 10 years: No If all of the above answers are "NO", then may proceed with Cephalosporin use.  . Sulfa Antibiotics Rash    Patient Active Problem List   Diagnosis Date Noted  . CVA (cerebral vascular accident) ([place]) 10/18/2016  . Right sided weakness 10/17/2016  . Unresponsive episode 10/17/2016  . Seizure ([place]) 10/16/2016  . Health care maintenance 05/02/2015  . Arm skin lesion, left 05/01/2015  . Foot pain, right 02/03/2015  . Pre-op evaluation 02/03/2015  . BMI 40.0-44.9, adult ([place]) 03/26/2014  . Osteoarthrosis, unspecified whether generalized or localized, lower leg 06/09/2012  . Situational anxiety 06/09/2012     Past Medical History:  Diagnosis Date  . GERD (gastroesophageal reflux disease)      Past Surgical History:  Procedure Laterality Date  . BUNIONECTOMY Right 09/25/2016   Procedure: lapidus type  modified mcbride;  Surgeon: Albertine Patricia, DPM;  Location: [place];  Service:  Podiatry;  Laterality: Right;  . CESAREAN SECTION  2004  . OSTECTOMY Right 09/25/2016   Procedure: OSTECTOMY RT   1SR,2ND 3RD  metatarsal;  Surgeon: Albertine Patricia, DPM;  Location: [place];  Service: Podiatry;  Laterality: Right;  LMA LOCAL  . TEE WITHOUT CARDIOVERSION N/A 10/28/2016   Procedure: TRANSESOPHAGEAL ECHOCARDIOGRAM (TEE);  Surgeon: Teodoro Spray, MD;  Location: [facility];  Service: Cardiovascular;  Laterality: N/A;    Social History   Social History  . Marital status: Married    Spouse name: N/A  . Number of children: 1  . Years of education: N/A   Occupational History  . transcriptionist    Social History Main Topics  . Smoking status: Never Smoker  . Smokeless tobacco: Never Used  . Alcohol use No  . Drug use: No  . Sexual activity: Yes    Birth control/ protection: None   Other Topics Concern  . Not on file   Social History Narrative  . No narrative on file     Family History  Problem Relation Age of Onset  . Aortic aneurysm Father        also has popliteal aneurysm  . Hypertension Father   . Hypercholesterolemia Father   . Diabetes Father   . Aortic aneurysm Mother   . Hypercholesterolemia Mother   . Hypercholesterolemia Sister        x2  . Hyperlipidemia Brother   . Hypertension Sister   . Breast cancer Maternal Aunt   . Melanoma Maternal Uncle   . Colon cancer Neg Hx      Current Outpatient Prescriptions:  .  aspirin 81 MG EC tablet, TAKE 1 TABLET BY MOUTH DAILY, Disp: 30 tablet, Rfl: 3 .  atorvastatin (LIPITOR) 40 MG tablet, TAKE 1 TABLET BY MOUTH EVERY DAY AT 6PM, Disp: 30 tablet, Rfl: 3 .  buPROPion (WELLBUTRIN XL) 300 MG 24 hr tablet, TAKE 1 TABLET (300 MG TOTAL) BY MOUTH DAILY., Disp: 90 tablet, Rfl: 1 .  levETIRAcetam (KEPPRA) 500 MG tablet, Take 1 tablet (500 mg total) by mouth 2 (two) times daily., Disp: 60 tablet, Rfl: 0 .  omeprazole (PRILOSEC) 20 MG capsule, Take 20 mg by mouth daily., Disp: , Rfl:    Physical exam:  Vitals:   11/20/16 1145  BP: (!) 139/96  Pulse: 84  Resp: 16  Temp: 98.8 F (37.1 C)  TempSrc: Tympanic    Weight: 257 lb 4.8 oz (116.7 kg)  Height: 5\' 7"  (1.702 m)   Physical Exam  Constitutional: She is oriented to person, place, and time and well-developed, well-nourished, and in no distress.  HENT:  Head: Normocephalic and atraumatic.  Eyes: Pupils are equal, round, and reactive to light. EOM are normal.  Neck: Normal range of motion.  Cardiovascular: Normal rate, regular rhythm and normal heart sounds.   Pulmonary/Chest: Effort normal and breath sounds normal.  Abdominal: Soft. Bowel sounds are normal.  Neurological: She is alert and oriented to person, place, and time.  Skin: Skin is warm and dry.       CMP Latest Ref Rng & Units 10/16/2016  Glucose 65 - 99 mg/dL 109(H)  BUN 6 - 20 mg/dL 26(H)  Creatinine 0.44 - 1.00 mg/dL 0.90  Sodium 135 - 145 mmol/L 135  Potassium 3.5 - 5.1 mmol/L 4.9  Chloride 101 - 111 mmol/L 102  CO2 22 - 32 mmol/L -  Calcium 8.9 - 10.3 mg/dL -  Total Protein 6.5 - 8.1 g/dL -  Total Bilirubin  0.3 - 1.2 mg/dL -  Alkaline Phos 38 - 126 U/L -  AST 15 - 41 U/L -  ALT 14 - 54 U/L -   CBC Latest Ref Rng & Units 10/16/2016  WBC 4.0 - 10.5 K/uL -  Hemoglobin 12.0 - 15.0 g/dL 11.9(L)  Hematocrit 36.0 - 46.0 % 35.0(L)  Platelets 150 - 400 K/uL -    Assessment and plan- Patient is a 46 y.o. female with a h/o acute embolic stroke of unclear etiology referred for heterozygous factor V lediden mutation  I discussed with the patient that heterozygosity for factor V Leiden typically increases the risk of venous thrombosis by about 4 times as compared to the general population. Risk can be about 20 times higher if there is a family h/o thrombophilia which is not in her case. Factor V leiden typically does not play a role in arterial strokes and I doubt that factor V Leiden was responsible for her acute ischemic stroke. From a hematological standpoint I will would not recommend full dose anticoagulation at this time. She has not had a complete vasculitis workup and  that is something which can be considered by her neurologist DR. Manuella Ghazi- ANA and comprehensive vasculitis testing. Anti Jo antibodies were negative. If she has another stroke, full dose anticoagulation will need to be considered.   I also recommended that her daughter should consider getting tested for factor V leiden especially if she is planning contraceptive use in the future   Thank you for this kind referral and the opportunity to participate in the care of this patient   Visit Diagnosis 1. Factor V Leiden mutation ([place])     Dr. Randa Evens, MD, MPH [facility] at [facility] Pager- [phone] 11/20/2016 2:03 PM

## 2016-11-20 NOTE — Progress Notes (Signed)
  Patient here for initial visit. She experienced a stroke  On 6/14 as a result of the work up after the stroke ithe mutation on Factor V was found. Patient's BP is elevated today she states it is usually normal.

## 2016-12-05 ENCOUNTER — Other Ambulatory Visit: Payer: Self-pay | Admitting: Internal Medicine

## 2016-12-12 ENCOUNTER — Ambulatory Visit: Payer: 59 | Attending: Neurology

## 2016-12-12 DIAGNOSIS — Z882 Allergy status to sulfonamides status: Secondary | ICD-10-CM | POA: Diagnosis not present

## 2016-12-12 DIAGNOSIS — Z8673 Personal history of transient ischemic attack (TIA), and cerebral infarction without residual deficits: Secondary | ICD-10-CM | POA: Insufficient documentation

## 2016-12-12 DIAGNOSIS — Z7982 Long term (current) use of aspirin: Secondary | ICD-10-CM | POA: Diagnosis not present

## 2016-12-12 DIAGNOSIS — Z880 Allergy status to penicillin: Secondary | ICD-10-CM | POA: Diagnosis not present

## 2016-12-12 DIAGNOSIS — R0683 Snoring: Secondary | ICD-10-CM | POA: Insufficient documentation

## 2016-12-12 DIAGNOSIS — Z79899 Other long term (current) drug therapy: Secondary | ICD-10-CM | POA: Insufficient documentation

## 2016-12-12 DIAGNOSIS — G4733 Obstructive sleep apnea (adult) (pediatric): Secondary | ICD-10-CM | POA: Diagnosis present

## 2017-02-03 ENCOUNTER — Ambulatory Visit (INDEPENDENT_AMBULATORY_CARE_PROVIDER_SITE_OTHER): Payer: 59 | Admitting: Internal Medicine

## 2017-02-03 ENCOUNTER — Encounter: Payer: Self-pay | Admitting: Internal Medicine

## 2017-02-03 VITALS — BP 128/84 | HR 96 | Temp 98.6°F | Resp 12 | Ht 67.0 in | Wt 260.6 lb

## 2017-02-03 DIAGNOSIS — Z13220 Encounter for screening for lipoid disorders: Secondary | ICD-10-CM

## 2017-02-03 DIAGNOSIS — F418 Other specified anxiety disorders: Secondary | ICD-10-CM | POA: Diagnosis not present

## 2017-02-03 DIAGNOSIS — Z6841 Body Mass Index (BMI) 40.0 and over, adult: Secondary | ICD-10-CM

## 2017-02-03 DIAGNOSIS — R569 Unspecified convulsions: Secondary | ICD-10-CM

## 2017-02-03 DIAGNOSIS — R4189 Other symptoms and signs involving cognitive functions and awareness: Secondary | ICD-10-CM | POA: Diagnosis not present

## 2017-02-03 NOTE — Progress Notes (Signed)
Subjective:    Patient ID: [name], female    DOB: [birth year], 46 y.o.   MRN: [id number]  HPI  Patient here for a scheduled follow up.  She reports she is doing well.  Feels good.  Working.  Handling stress.  Recently admitted with unresponsive episode.  See previous notes for details.  Unclear etiology.  Has seen hematology.  Recommended continuing aspirin daily.  Has seen neurology.  Has f/u planned 04/2017.  On keppra now.  May titrate - per neurology.  Plans to f/u with cardiology - for possible loop recorder.  No chest pain.  No sob.  No acid reflux.  No abdominal pain.  Has been seeing podiatry.  Better.  Plans to start exercising more.     Past Medical History:  Diagnosis Date  . GERD (gastroesophageal reflux disease)    Past Surgical History:  Procedure Laterality Date  . BUNIONECTOMY Right 09/25/2016   Procedure: lapidus type  modified mcbride;  Surgeon: Albertine Patricia, DPM;  Location: [place];  Service: Podiatry;  Laterality: Right;  . CESAREAN SECTION  2004  . OSTECTOMY Right 09/25/2016   Procedure: OSTECTOMY RT  1SR,2ND 3RD  metatarsal;  Surgeon: Albertine Patricia, DPM;  Location: [place];  Service: Podiatry;  Laterality: Right;  LMA LOCAL  . TEE WITHOUT CARDIOVERSION N/A 10/28/2016   Procedure: TRANSESOPHAGEAL ECHOCARDIOGRAM (TEE);  Surgeon: Teodoro Spray, MD;  Location: [facility];  Service: Cardiovascular;  Laterality: N/A;   Family History  Problem Relation Age of Onset  . Aortic aneurysm Father        also has popliteal aneurysm  . Hypertension Father   . Hypercholesterolemia Father   . Diabetes Father   . Aortic aneurysm Mother   . Hypercholesterolemia Mother   . Hypercholesterolemia Sister        x2  . Hyperlipidemia Brother   . Hypertension Sister   . Breast cancer Maternal Aunt   . Melanoma Maternal Uncle   . Colon cancer Neg Hx    Social History   Social History  . Marital status: Married    Spouse name: N/A  . Number of  children: 1  . Years of education: N/A   Occupational History  . transcriptionist    Social History Main Topics  . Smoking status: Never Smoker  . Smokeless tobacco: Never Used  . Alcohol use No  . Drug use: No  . Sexual activity: Yes    Birth control/ protection: None   Other Topics Concern  . None   Social History Narrative  . None    Outpatient Encounter Prescriptions as of 02/03/2017  Medication Sig  . aspirin 81 MG EC tablet TAKE 1 TABLET BY MOUTH DAILY  . buPROPion (WELLBUTRIN XL) 300 MG 24 hr tablet TAKE 1 TABLET (300 MG TOTAL) BY MOUTH DAILY.  Marland Kitchen levETIRAcetam (KEPPRA) 500 MG tablet Take 1 tablet (500 mg total) by mouth 2 (two) times daily.  Marland Kitchen omeprazole (PRILOSEC) 20 MG capsule Take 20 mg by mouth daily.  Marland Kitchen atorvastatin (LIPITOR) 40 MG tablet TAKE 1 TABLET BY MOUTH EVERY DAY AT 6PM (Patient not taking: Reported on 02/03/2017)   No facility-administered encounter medications on file as of 02/03/2017.     Review of Systems  Constitutional: Negative for appetite change and unexpected weight change.  HENT: Negative for congestion and sinus pressure.   Respiratory: Negative for cough, chest tightness and shortness of breath.   Cardiovascular: Negative for chest pain, palpitations and leg swelling.  Gastrointestinal: Negative for abdominal pain, diarrhea, nausea and vomiting.  Genitourinary: Negative for difficulty urinating and dysuria.  Musculoskeletal: Negative for back pain and joint swelling.  Skin: Negative for color change and rash.  Neurological: Negative for dizziness, light-headedness and headaches.  Psychiatric/Behavioral: Negative for agitation and dysphoric mood.       Objective:   blood pressure rechecked by me:  128/84  Physical Exam  Constitutional: She appears well-developed and well-nourished. No distress.  HENT:  Nose: Nose normal.  Mouth/Throat: Oropharynx is clear and moist.  Neck: Neck supple. No thyromegaly present.  Cardiovascular: Normal  rate and regular rhythm.   Pulmonary/Chest: Breath sounds normal. No respiratory distress. She has no wheezes.  Abdominal: Soft. Bowel sounds are normal. There is no tenderness.  Musculoskeletal: She exhibits no edema or tenderness.  Lymphadenopathy:    She has no cervical adenopathy.  Skin: No rash noted. No erythema.  Psychiatric: She has a normal mood and affect. Her behavior is normal.    BP 128/84   Pulse 96   Temp 98.6 F (37 C) (Oral)   Resp 12   Ht 5\' 7"  (1.702 m)   Wt 260 lb 9.6 oz (118.2 kg)   SpO2 97%   BMI 40.82 kg/m  Wt Readings from Last 3 Encounters:  02/03/17 260 lb 9.6 oz (118.2 kg)  11/20/16 257 lb 4.8 oz (116.7 kg)  10/30/16 258 lb (117 kg)     Lab Results  Component Value Date   WBC 9.7 10/16/2016   HGB 11.9 (L) 10/16/2016   HCT 35.0 (L) 10/16/2016   PLT 274 10/16/2016   GLUCOSE 109 (H) 10/16/2016   CHOL 170 10/17/2016   TRIG 71 10/17/2016   HDL 51 10/17/2016   LDLCALC 105 (H) 10/17/2016   ALT 23 10/16/2016   AST 26 10/16/2016   NA 135 10/16/2016   K 4.9 10/16/2016   CL 102 10/16/2016   CREATININE 0.90 10/16/2016   BUN 26 (H) 10/16/2016   CO2 24 10/16/2016   TSH 1.47 10/30/2015   INR 1.04 10/16/2016   HGBA1C 5.4 10/17/2016       Assessment & Plan:   Problem List Items Addressed This Visit    BMI 40.0-44.9, adult ([place])    Diet and exercise.  Follow.       Seizure [facility])    Admitted with unresponsive episode.  On keppra.  Had recent negative EEG.  Seeing neurology.  Planned f/u in 04/2017.  May taper keppra at that time - per pt.        Relevant Orders   CBC with Differential/Platelet   Comprehensive metabolic panel   TSH   Situational anxiety    On wellbutrin.  Stable.        Unresponsive episode    Recently admitted as outlined.  See last note for details.  Saw hematology.  Seeing neurology.  Plans to f/u with cardiology as outlined.  No reoccurring episodes.  Follow.  Continue daily aspirin.         Other Visit Diagnoses     Screening cholesterol level    -  Primary   Relevant Orders   Lipid panel       Einar Pheasant, MD

## 2017-02-06 ENCOUNTER — Encounter: Payer: Self-pay | Admitting: Internal Medicine

## 2017-02-06 NOTE — Assessment & Plan Note (Signed)
Admitted with unresponsive episode.  On keppra.  Had recent negative EEG.  Seeing neurology.  Planned f/u in 04/2017.  May taper keppra at that time - per pt.

## 2017-02-06 NOTE — Assessment & Plan Note (Signed)
Diet and exercise.  Follow.  

## 2017-02-06 NOTE — Assessment & Plan Note (Signed)
On wellbutrin.  Stable.  

## 2017-02-06 NOTE — Assessment & Plan Note (Signed)
Recently admitted as outlined.  See last note for details.  Saw hematology.  Seeing neurology.  Plans to f/u with cardiology as outlined.  No reoccurring episodes.  Follow.  Continue daily aspirin.

## 2017-02-19 ENCOUNTER — Encounter: Payer: Self-pay | Admitting: *Deleted

## 2017-02-19 ENCOUNTER — Encounter
Admission: RE | Disposition: A | Discharge status: Home / Self Care | Payer: Self-pay | Source: Ambulatory Visit | Attending: Cardiology

## 2017-02-19 ENCOUNTER — Ambulatory Visit
Admission: RE | Admit: 2017-02-19 | Discharge: 2017-02-19 | Disposition: A | Discharge status: Home / Self Care | Payer: 59 | Source: Ambulatory Visit | Attending: Cardiology | Admitting: Cardiology

## 2017-02-19 DIAGNOSIS — G43909 Migraine, unspecified, not intractable, without status migrainosus: Secondary | ICD-10-CM | POA: Insufficient documentation

## 2017-02-19 DIAGNOSIS — R55 Syncope and collapse: Secondary | ICD-10-CM | POA: Diagnosis present

## 2017-02-19 DIAGNOSIS — Z8673 Personal history of transient ischemic attack (TIA), and cerebral infarction without residual deficits: Secondary | ICD-10-CM | POA: Diagnosis not present

## 2017-02-19 DIAGNOSIS — Z882 Allergy status to sulfonamides status: Secondary | ICD-10-CM | POA: Insufficient documentation

## 2017-02-19 DIAGNOSIS — Z79899 Other long term (current) drug therapy: Secondary | ICD-10-CM | POA: Diagnosis not present

## 2017-02-19 DIAGNOSIS — Z8249 Family history of ischemic heart disease and other diseases of the circulatory system: Secondary | ICD-10-CM | POA: Insufficient documentation

## 2017-02-19 DIAGNOSIS — Z6841 Body Mass Index (BMI) 40.0 and over, adult: Secondary | ICD-10-CM | POA: Insufficient documentation

## 2017-02-19 DIAGNOSIS — E669 Obesity, unspecified: Secondary | ICD-10-CM | POA: Insufficient documentation

## 2017-02-19 DIAGNOSIS — K219 Gastro-esophageal reflux disease without esophagitis: Secondary | ICD-10-CM | POA: Diagnosis not present

## 2017-02-19 DIAGNOSIS — Z880 Allergy status to penicillin: Secondary | ICD-10-CM | POA: Insufficient documentation

## 2017-02-19 HISTORY — PX: LOOP RECORDER INSERTION: EP1214

## 2017-02-19 SURGERY — LOOP RECORDER INSERTION
Anesthesia: LOCAL

## 2017-02-19 MED ORDER — LIDOCAINE-EPINEPHRINE (PF) 1 %-1:200000 IJ SOLN
INTRAMUSCULAR | Status: DC | PRN
  Administered 2017-02-19: 20 mL via INTRADERMAL

## 2017-02-19 MED ORDER — LIDOCAINE-EPINEPHRINE 1 %-1:200000 IJ SOLN
INTRAMUSCULAR | Status: AC
Start: 2017-02-19 — End: ?
  Filled 2017-02-19: qty 30

## 2017-02-19 SURGICAL SUPPLY — 2 items
LOOP REVEAL LINQSYS (Prosthesis & Implant Heart) ×2 IMPLANT
PACK LOOP INSERTION (CUSTOM PROCEDURE TRAY) ×2 IMPLANT

## 2017-03-10 ENCOUNTER — Other Ambulatory Visit: Payer: Self-pay | Admitting: Internal Medicine

## 2017-05-01 ENCOUNTER — Other Ambulatory Visit (INDEPENDENT_AMBULATORY_CARE_PROVIDER_SITE_OTHER): Payer: 59

## 2017-05-01 DIAGNOSIS — Z13220 Encounter for screening for lipoid disorders: Secondary | ICD-10-CM

## 2017-05-01 DIAGNOSIS — R569 Unspecified convulsions: Secondary | ICD-10-CM | POA: Diagnosis not present

## 2017-05-01 LAB — COMPREHENSIVE METABOLIC PANEL
ALK PHOS: 71 U/L (ref 39–117)
ALT: 21 U/L (ref 0–35)
AST: 18 U/L (ref 0–37)
Albumin: 3.5 g/dL (ref 3.5–5.2)
BILIRUBIN TOTAL: 0.4 mg/dL (ref 0.2–1.2)
BUN: 14 mg/dL (ref 6–23)
CALCIUM: 8.5 mg/dL (ref 8.4–10.5)
CO2: 29 meq/L (ref 19–32)
CREATININE: 0.78 mg/dL (ref 0.40–1.20)
Chloride: 103 mEq/L (ref 96–112)
GFR: 84.63 mL/min (ref >60.00)
GLUCOSE: 93 mg/dL (ref 70–99)
Potassium: 4.2 mEq/L (ref 3.5–5.1)
Sodium: 138 mEq/L (ref 135–145)
TOTAL PROTEIN: 6.6 g/dL (ref 6.0–8.3)

## 2017-05-01 LAB — CBC WITH DIFFERENTIAL/PLATELET
BASOS ABS: 0.1 10*3/uL (ref 0.0–0.1)
Basophils Relative: 0.7 % (ref 0.0–3.0)
EOS ABS: 0.2 10*3/uL (ref 0.0–0.7)
Eosinophils Relative: 1.9 % (ref 0.0–5.0)
HCT: 36.7 % (ref 36.0–46.0)
Hemoglobin: 11.9 g/dL — ABNORMAL LOW (ref 12.0–15.0)
LYMPHS ABS: 2.3 10*3/uL (ref 0.7–4.0)
Lymphocytes Relative: 25.8 % (ref 12.0–46.0)
MCHC: 32.4 g/dL (ref 30.0–36.0)
MCV: 83.7 fl (ref 78.0–100.0)
MONO ABS: 0.7 10*3/uL (ref 0.1–1.0)
Monocytes Relative: 7.5 % (ref 3.0–12.0)
NEUTROS ABS: 5.6 10*3/uL (ref 1.4–7.7)
NEUTROS PCT: 64.1 % (ref 43.0–77.0)
PLATELETS: 314 10*3/uL (ref 150.0–400.0)
RBC: 4.38 Mil/uL (ref 3.87–5.11)
RDW: 14.1 % (ref 11.5–15.5)
WBC: 8.8 10*3/uL (ref 4.0–10.5)

## 2017-05-01 LAB — LIPID PANEL
CHOLESTEROL: 136 mg/dL (ref 0–200)
HDL: 49.8 mg/dL (ref >39.00)
LDL Cholesterol: 62 mg/dL (ref 0–99)
NONHDL: 86.56
Total CHOL/HDL Ratio: 3
Triglycerides: 122 mg/dL (ref 0.0–149.0)
VLDL: 24.4 mg/dL (ref 0.0–40.0)

## 2017-05-01 LAB — TSH: TSH: 2.59 u[IU]/mL (ref 0.35–4.50)

## 2017-05-03 ENCOUNTER — Encounter: Payer: Self-pay | Admitting: Internal Medicine

## 2017-05-04 ENCOUNTER — Encounter: Payer: Self-pay | Admitting: Internal Medicine

## 2017-05-04 ENCOUNTER — Ambulatory Visit (INDEPENDENT_AMBULATORY_CARE_PROVIDER_SITE_OTHER): Payer: 59 | Admitting: Internal Medicine

## 2017-05-04 VITALS — BP 118/78 | HR 94 | Temp 98.4°F | Ht 67.0 in | Wt 267.2 lb

## 2017-05-04 DIAGNOSIS — D649 Anemia, unspecified: Secondary | ICD-10-CM | POA: Diagnosis not present

## 2017-05-04 DIAGNOSIS — R569 Unspecified convulsions: Secondary | ICD-10-CM

## 2017-05-04 DIAGNOSIS — Z6841 Body Mass Index (BMI) 40.0 and over, adult: Secondary | ICD-10-CM | POA: Diagnosis not present

## 2017-05-04 DIAGNOSIS — F418 Other specified anxiety disorders: Secondary | ICD-10-CM | POA: Diagnosis not present

## 2017-05-04 DIAGNOSIS — Z8673 Personal history of transient ischemic attack (TIA), and cerebral infarction without residual deficits: Secondary | ICD-10-CM | POA: Diagnosis not present

## 2017-05-04 DIAGNOSIS — I639 Cerebral infarction, unspecified: Secondary | ICD-10-CM

## 2017-05-04 DIAGNOSIS — Z0000 Encounter for general adult medical examination without abnormal findings: Secondary | ICD-10-CM

## 2017-05-04 NOTE — Progress Notes (Signed)
Patient ID: Gwendolyn Torres, female   DOB: [birth year], 46 y.o.   MRN: [id number]   Subjective:    Patient ID: Gwendolyn Torres, female    DOB: [birth year], 46 y.o.   MRN: [id number]  HPI  Patient here for her physical exam.  She reports she is doing well.  Feels good.  Has not been exercising. Not watching her diet.  Plans to start exercising and watching her diet.  No chest pain.  No sob.  No acid reflux.  No abdominal pain.  Bowels moving.  Saw neurology (Dr Manuella Ghazi).  History of right parietal cortical infarct.  On aspirin.  In the process of decreasing keppra.  Doing well.  No further "episodes".  Has loop recorder.  Overall she feels she is doing well.  Found to have meningioma on MRI previously.  Planning for f/u MRI in 10/2017.      Past Medical History:  Diagnosis Date  . GERD (gastroesophageal reflux disease)    Past Surgical History:  Procedure Laterality Date  . BUNIONECTOMY Right 09/25/2016   Procedure: lapidus type  modified mcbride;  Surgeon: Albertine Patricia, DPM;  Location: [place];  Service: Podiatry;  Laterality: Right;  . CESAREAN SECTION  2004  . LOOP RECORDER INSERTION N/A 02/19/2017   Procedure: LOOP RECORDER INSERTION;  Surgeon: Isaias Cowman, MD;  Location: [place] [facility];  Service: Cardiovascular;  Laterality: N/A;  . OSTECTOMY Right 09/25/2016   Procedure: OSTECTOMY RT  1SR,2ND 3RD  metatarsal;  Surgeon: Albertine Patricia, DPM;  Location: [place];  Service: Podiatry;  Laterality: Right;  LMA LOCAL  . TEE WITHOUT CARDIOVERSION N/A 10/28/2016   Procedure: TRANSESOPHAGEAL ECHOCARDIOGRAM (TEE);  Surgeon: Teodoro Spray, MD;  Location: [facility];  Service: Cardiovascular;  Laterality: N/A;   Family History  Problem Relation Age of Onset  . Aortic aneurysm Father        also has popliteal aneurysm  . Hypertension Father   . Hypercholesterolemia Father   . Diabetes Father   . Aortic aneurysm Mother   . Hypercholesterolemia Mother   .  Hypercholesterolemia Sister        x2  . Hyperlipidemia Brother   . Hypertension Sister   . Breast cancer Maternal Aunt   . Melanoma Maternal Uncle   . Colon cancer Neg Hx    Social History   Socioeconomic History  . Marital status: Married    Spouse name: None  . Number of children: 1  . Years of education: None  . Highest education level: None  Social Needs  . Financial resource strain: None  . Food insecurity - worry: None  . Food insecurity - inability: None  . Transportation needs - medical: None  . Transportation needs - non-medical: None  Occupational History  . Occupation: transcriptionist  Tobacco Use  . Smoking status: Never Smoker  . Smokeless tobacco: Never Used  Substance and Sexual Activity  . Alcohol use: No    Alcohol/week: 0.0 oz  . Drug use: No  . Sexual activity: Yes    Birth control/protection: None  Other Topics Concern  . None  Social History Narrative  . None    Review of Systems  Constitutional: Negative for appetite change and unexpected weight change.  HENT: Negative for congestion and sinus pressure.   Eyes: Negative for pain and visual disturbance.  Respiratory: Negative for cough, chest tightness and shortness of breath.   Cardiovascular: Negative for chest pain, palpitations and leg swelling.  Gastrointestinal: Negative  for abdominal pain, diarrhea, nausea and vomiting.  Genitourinary: Negative for difficulty urinating and dysuria.  Musculoskeletal: Negative for back pain and joint swelling.  Skin: Negative for color change and rash.  Neurological: Negative for dizziness, light-headedness and headaches.  Hematological: Negative for adenopathy. Does not bruise/bleed easily.  Psychiatric/Behavioral: Negative for agitation and dysphoric mood.       Objective:     Blood pressure rechecked by me:  118/78  Physical Exam  Constitutional: She is oriented to person, place, and time. She appears well-developed and well-nourished. No  distress.  HENT:  Nose: Nose normal.  Mouth/Throat: Oropharynx is clear and moist.  Eyes: Right eye exhibits no discharge. Left eye exhibits no discharge. No scleral icterus.  Neck: Neck supple. No thyromegaly present.  Cardiovascular: Normal rate and regular rhythm.  Pulmonary/Chest: Breath sounds normal. No accessory muscle usage. No tachypnea. No respiratory distress. She has no decreased breath sounds. She has no wheezes. She has no rhonchi. Right breast exhibits no inverted nipple, no mass, no nipple discharge and no tenderness (no axillary adenopathy). Left breast exhibits no inverted nipple, no mass, no nipple discharge and no tenderness (no axilarry adenopathy).  Abdominal: Soft. Bowel sounds are normal. There is no tenderness.  Musculoskeletal: She exhibits no edema or tenderness.  Lymphadenopathy:    She has no cervical adenopathy.  Neurological: She is alert and oriented to person, place, and time.  Skin: Skin is warm. No rash noted. No erythema.  Psychiatric: She has a normal mood and affect. Her behavior is normal.    BP 118/78   Pulse 94   Temp 98.4 F (36.9 C) (Oral)   Ht 5\' 7"  (1.702 m)   Wt 267 lb 3.2 oz (121.2 kg)   SpO2 95%   BMI 41.85 kg/m  Wt Readings from Last 3 Encounters:  05/04/17 267 lb 3.2 oz (121.2 kg)  02/19/17 260 lb (117.9 kg)  02/03/17 260 lb 9.6 oz (118.2 kg)     Lab Results  Component Value Date   WBC 8.8 05/01/2017   HGB 11.9 (L) 05/01/2017   HCT 36.7 05/01/2017   PLT 314.0 05/01/2017   GLUCOSE 93 05/01/2017   CHOL 136 05/01/2017   TRIG 122.0 05/01/2017   HDL 49.80 05/01/2017   LDLCALC 62 05/01/2017   ALT 21 05/01/2017   AST 18 05/01/2017   NA 138 05/01/2017   K 4.2 05/01/2017   CL 103 05/01/2017   CREATININE 0.78 05/01/2017   BUN 14 05/01/2017   CO2 29 05/01/2017   TSH 2.59 05/01/2017   INR 1.04 10/16/2016   HGBA1C 5.4 10/17/2016       Assessment & Plan:   Problem List Items Addressed This Visit    BMI 40.0-44.9, adult  ([place])    Discussed diet and exercise.  Follow.       CVA (cerebral vascular accident) [facility])    Recently admitted.  Found to have right parietal infarct.  On aspirin.  Doing well.  Seeing neurology.        Health care maintenance    Physical today 05/04/17.  PAP 05/01/15 - negative with scant cellularity (satisfactory for evaluation).  Negative HPV.  Discussed results of pap.  Discussed repeat pap today.  She wants to hold until next year.  Mammogram 09/02/16 - Birads0.  Recommended left breast mammogram.  Unilateral f/u left breast mammogram 09/09/16 - Birads I.        Seizure [facility])    Previously admitted with unresponsive episode.  On keppra.  Tapering off.  Had negative EEG.  Seeing neurology.  No further "episodes".        Situational anxiety    On wellbutrin.  Stable.         Other Visit Diagnoses    Routine general medical examination at a health care facility    -  Primary   Anemia, unspecified type       Relevant Orders   CBC with Differential/Platelet   Ferritin   Vitamin B12       Einar Pheasant, MD

## 2017-05-04 NOTE — Progress Notes (Signed)
Pre visit review using our clinic review tool, if applicable. No additional management support is needed unless otherwise documented below in the visit note. 

## 2017-05-04 NOTE — Assessment & Plan Note (Addendum)
Physical today 05/04/17.  PAP 05/01/15 - negative with scant cellularity (satisfactory for evaluation).  Negative HPV.  Discussed results of pap.  Discussed repeat pap today.  She wants to hold until next year.  Mammogram 09/02/16 - Birads0.  Recommended left breast mammogram.  Unilateral f/u left breast mammogram 09/09/16 - Birads I.

## 2017-05-05 ENCOUNTER — Encounter: Payer: Self-pay | Admitting: Internal Medicine

## 2017-05-05 NOTE — Assessment & Plan Note (Signed)
Previously admitted with unresponsive episode.  On keppra.  Tapering off.  Had negative EEG.  Seeing neurology.  No further "episodes".

## 2017-05-05 NOTE — Assessment & Plan Note (Signed)
Discussed diet and exercise.  Follow.  

## 2017-05-05 NOTE — Assessment & Plan Note (Signed)
Recently admitted.  Found to have right parietal infarct.  On aspirin.  Doing well.  Seeing neurology.

## 2017-05-05 NOTE — Assessment & Plan Note (Signed)
On wellbutrin.  Stable.  

## 2017-06-12 ENCOUNTER — Other Ambulatory Visit: Payer: Self-pay | Admitting: Internal Medicine

## 2017-07-01 ENCOUNTER — Other Ambulatory Visit (INDEPENDENT_AMBULATORY_CARE_PROVIDER_SITE_OTHER): Payer: 59

## 2017-07-01 DIAGNOSIS — D649 Anemia, unspecified: Secondary | ICD-10-CM

## 2017-07-01 LAB — CBC WITH DIFFERENTIAL/PLATELET
BASOS PCT: 0.9 % (ref 0.0–3.0)
Basophils Absolute: 0.1 10*3/uL (ref 0.0–0.1)
EOS PCT: 1.5 % (ref 0.0–5.0)
Eosinophils Absolute: 0.1 10*3/uL (ref 0.0–0.7)
HEMATOCRIT: 35.5 % — AB (ref 36.0–46.0)
HEMOGLOBIN: 11.8 g/dL — AB (ref 12.0–15.0)
LYMPHS PCT: 23.6 % (ref 12.0–46.0)
Lymphs Abs: 2.1 10*3/uL (ref 0.7–4.0)
MCHC: 33.2 g/dL (ref 30.0–36.0)
MCV: 82 fl (ref 78.0–100.0)
MONO ABS: 0.8 10*3/uL (ref 0.1–1.0)
MONOS PCT: 8.5 % (ref 3.0–12.0)
Neutro Abs: 6 10*3/uL (ref 1.4–7.7)
Neutrophils Relative %: 65.5 % (ref 43.0–77.0)
Platelets: 326 10*3/uL (ref 150.0–400.0)
RBC: 4.33 Mil/uL (ref 3.87–5.11)
RDW: 14.6 % (ref 11.5–15.5)
WBC: 9.1 10*3/uL (ref 4.0–10.5)

## 2017-07-01 LAB — VITAMIN B12: Vitamin B-12: 361 pg/mL (ref 211–911)

## 2017-07-01 LAB — FERRITIN: FERRITIN: 14.5 ng/mL (ref 10.0–291.0)

## 2017-07-19 ENCOUNTER — Other Ambulatory Visit: Payer: Self-pay | Admitting: Internal Medicine

## 2017-08-14 ENCOUNTER — Other Ambulatory Visit: Payer: Self-pay | Admitting: Neurology

## 2017-08-14 ENCOUNTER — Other Ambulatory Visit (HOSPITAL_COMMUNITY): Payer: Self-pay | Admitting: Neurology

## 2017-08-14 DIAGNOSIS — R569 Unspecified convulsions: Secondary | ICD-10-CM

## 2017-08-14 DIAGNOSIS — M542 Cervicalgia: Secondary | ICD-10-CM

## 2017-08-20 ENCOUNTER — Ambulatory Visit
Admission: RE | Admit: 2017-08-20 | Discharge: 2017-08-20 | Disposition: A | Discharge status: Home / Self Care | Payer: 59 | Source: Ambulatory Visit | Attending: Neurology | Admitting: Neurology

## 2017-08-20 DIAGNOSIS — M4802 Spinal stenosis, cervical region: Secondary | ICD-10-CM | POA: Insufficient documentation

## 2017-08-20 DIAGNOSIS — M50222 Other cervical disc displacement at C5-C6 level: Secondary | ICD-10-CM | POA: Diagnosis not present

## 2017-08-20 DIAGNOSIS — M542 Cervicalgia: Secondary | ICD-10-CM

## 2017-08-20 DIAGNOSIS — D320 Benign neoplasm of cerebral meninges: Secondary | ICD-10-CM | POA: Diagnosis not present

## 2017-08-20 DIAGNOSIS — Z8673 Personal history of transient ischemic attack (TIA), and cerebral infarction without residual deficits: Secondary | ICD-10-CM | POA: Insufficient documentation

## 2017-08-20 DIAGNOSIS — R569 Unspecified convulsions: Secondary | ICD-10-CM | POA: Insufficient documentation

## 2017-08-20 MED ORDER — GADOBENATE DIMEGLUMINE 529 MG/ML IV SOLN
20.0000 mL | Freq: Once | INTRAVENOUS | Status: AC | PRN
  Administered 2017-08-20: 20 mL via INTRAVENOUS

## 2017-09-24 ENCOUNTER — Other Ambulatory Visit: Payer: Self-pay | Admitting: Internal Medicine

## 2017-09-24 DIAGNOSIS — Z1231 Encounter for screening mammogram for malignant neoplasm of breast: Secondary | ICD-10-CM

## 2017-10-05 ENCOUNTER — Ambulatory Visit
Admission: RE | Admit: 2017-10-05 | Discharge: 2017-10-05 | Disposition: A | Discharge status: Home / Self Care | Payer: 59 | Source: Ambulatory Visit | Attending: Internal Medicine | Admitting: Internal Medicine

## 2017-10-05 DIAGNOSIS — Z1231 Encounter for screening mammogram for malignant neoplasm of breast: Secondary | ICD-10-CM

## 2017-11-02 ENCOUNTER — Encounter: Payer: Self-pay | Admitting: Internal Medicine

## 2017-11-02 ENCOUNTER — Ambulatory Visit: Payer: 59 | Admitting: Internal Medicine

## 2017-11-02 ENCOUNTER — Other Ambulatory Visit: Payer: Self-pay | Admitting: Internal Medicine

## 2017-11-02 DIAGNOSIS — R42 Dizziness and giddiness: Secondary | ICD-10-CM

## 2017-11-02 DIAGNOSIS — I639 Cerebral infarction, unspecified: Secondary | ICD-10-CM | POA: Diagnosis not present

## 2017-11-02 DIAGNOSIS — R569 Unspecified convulsions: Secondary | ICD-10-CM | POA: Diagnosis not present

## 2017-11-02 DIAGNOSIS — D649 Anemia, unspecified: Secondary | ICD-10-CM

## 2017-11-02 DIAGNOSIS — F418 Other specified anxiety disorders: Secondary | ICD-10-CM

## 2017-11-02 LAB — CBC WITH DIFFERENTIAL/PLATELET
BASOS ABS: 0.1 10*3/uL (ref 0.0–0.1)
BASOS PCT: 0.9 % (ref 0.0–3.0)
EOS PCT: 2 % (ref 0.0–5.0)
Eosinophils Absolute: 0.2 10*3/uL (ref 0.0–0.7)
HEMATOCRIT: 33.8 % — AB (ref 36.0–46.0)
Hemoglobin: 11.4 g/dL — ABNORMAL LOW (ref 12.0–15.0)
LYMPHS ABS: 2.1 10*3/uL (ref 0.7–4.0)
LYMPHS PCT: 25.1 % (ref 12.0–46.0)
MCHC: 33.7 g/dL (ref 30.0–36.0)
MCV: 79.6 fl (ref 78.0–100.0)
MONOS PCT: 7.5 % (ref 3.0–12.0)
Monocytes Absolute: 0.6 10*3/uL (ref 0.1–1.0)
NEUTROS ABS: 5.4 10*3/uL (ref 1.4–7.7)
Neutrophils Relative %: 64.5 % (ref 43.0–77.0)
PLATELETS: 336 10*3/uL (ref 150.0–400.0)
RBC: 4.25 Mil/uL (ref 3.87–5.11)
RDW: 15.8 % — AB (ref 11.5–15.5)
WBC: 8.4 10*3/uL (ref 4.0–10.5)

## 2017-11-02 LAB — COMPREHENSIVE METABOLIC PANEL
ALBUMIN: 3.8 g/dL (ref 3.5–5.2)
ALT: 20 U/L (ref 0–35)
AST: 15 U/L (ref 0–37)
Alkaline Phosphatase: 75 U/L (ref 39–117)
BUN: 16 mg/dL (ref 6–23)
CALCIUM: 8.9 mg/dL (ref 8.4–10.5)
CHLORIDE: 106 meq/L (ref 96–112)
CO2: 26 mEq/L (ref 19–32)
CREATININE: 0.81 mg/dL (ref 0.40–1.20)
GFR: 80.84 mL/min (ref >60.00)
Glucose, Bld: 104 mg/dL — ABNORMAL HIGH (ref 70–99)
Potassium: 4.2 mEq/L (ref 3.5–5.1)
Sodium: 139 mEq/L (ref 135–145)
Total Bilirubin: 0.4 mg/dL (ref 0.2–1.2)
Total Protein: 7 g/dL (ref 6.0–8.3)

## 2017-11-02 LAB — VITAMIN B12

## 2017-11-02 LAB — FERRITIN: Ferritin: 10 ng/mL (ref 10.0–291.0)

## 2017-11-02 LAB — IBC PANEL
IRON: 43 ug/dL (ref 42–145)
Saturation Ratios: 10.8 % — ABNORMAL LOW (ref 20.0–50.0)
TRANSFERRIN: 285 mg/dL (ref 212.0–360.0)

## 2017-11-02 MED ORDER — BUPROPION HCL ER (XL) 150 MG PO TB24: ORAL_TABLET | ORAL | 0 refills | Status: DC

## 2017-11-02 NOTE — Progress Notes (Signed)
Patient ID: Gwendolyn Torres, female   DOB: 07-21-1970, 47 y.o.   MRN: [id number]   Subjective:    Patient ID: Gwendolyn Torres, female    DOB: 1971/01/09, 46 y.o.   MRN: [id number]  HPI  Patient here for a scheduled follow up.  Was recently reevaluated by neurology.   On 08/05/17 - noticed tongue went numb.  Also had some left arm and face numbness.  Episode only lasted 30 seconds.  On 08/09/17 - noticed dizziness, increased heart rate and left arm numb.  Felt like she was going to pass out.  Lasted for several minutes.  Gets anxious when the episodes occur.  States feels this may be aggravating.  Had MRI brain that revealed no acute abnormalities, old stroke and stable meningioma.  MRI c-spine - some mild canal stenosis as outlined.  Concern over possible C-8 radiculopathy.  Prescribed methocarbamol.  States she had another episode at the beginning of 10/2017.  Started her period 10/08/17.  Noticed an episode of tingling in her legs and felt dizzy.  Also following day - head felt funny.  States when this occurs, she panics.  Feels like she can't breathe.  The following day just felt off balance and noticed feet tingling.  After this episode, has not had any further episodes.  Trying to stay active.  No chest pain.  Feels breathing is stable.  No headache.  Has a history of migraines.  States that with previous migraines, her head would feel funny and some of the sensation feels similar.  Migraines have been better.  Eating.  No nausea or vomiting.  Sleeping.  She feels she is doing well and is questioning the need for continued wellbutrin.  We did discuss changing wellbutrin given question of concern regarding possible seizure history.  She wants to taper wellbutrin and hold on starting anything new.  Overall she feels she is handling stress relatively well.  Has medtronic linq device.  States transmitted with episodes.     Past Medical History:  Diagnosis Date  . GERD (gastroesophageal reflux disease)    Past  Surgical History:  Procedure Laterality Date  . BUNIONECTOMY Right 09/25/2016   Procedure: lapidus type  modified mcbride;  Surgeon: Albertine Patricia, DPM;  Location: [place];  Service: Podiatry;  Laterality: Right;  . CESAREAN SECTION  2004  . LOOP RECORDER INSERTION N/A 02/19/2017   Procedure: LOOP RECORDER INSERTION;  Surgeon: Isaias Cowman, MD;  Location: [place] [facility];  Service: Cardiovascular;  Laterality: N/A;  . OSTECTOMY Right 09/25/2016   Procedure: OSTECTOMY RT  1SR,2ND 3RD  metatarsal;  Surgeon: Albertine Patricia, DPM;  Location: [place];  Service: Podiatry;  Laterality: Right;  LMA LOCAL  . TEE WITHOUT CARDIOVERSION N/A 10/28/2016   Procedure: TRANSESOPHAGEAL ECHOCARDIOGRAM (TEE);  Surgeon: Teodoro Spray, MD;  Location: [facility];  Service: Cardiovascular;  Laterality: N/A;   Family History  Problem Relation Age of Onset  . Aortic aneurysm Father        also has popliteal aneurysm  . Hypertension Father   . Hypercholesterolemia Father   . Diabetes Father   . Aortic aneurysm Mother   . Hypercholesterolemia Mother   . Hypercholesterolemia Sister        x2  . Hyperlipidemia Brother   . Hypertension Sister   . Breast cancer Maternal Aunt   . Melanoma Maternal Uncle   . Colon cancer Neg Hx    Social History   Socioeconomic History  . Marital  status: Married    Spouse name: Not on file  . Number of children: 1  . Years of education: Not on file  . Highest education level: Not on file  Occupational History  . Occupation: Location manager  Social Needs  . Financial resource strain: Not on file  . Food insecurity:    Worry: Not on file    Inability: Not on file  . Transportation needs:    Medical: Not on file    Non-medical: Not on file  Tobacco Use  . Smoking status: Never Smoker  . Smokeless tobacco: Never Used  Substance and Sexual Activity  . Alcohol use: No    Alcohol/week: 0.0 oz  . Drug use: No  . Sexual activity: Yes     Birth control/protection: None  Lifestyle  . Physical activity:    Days per week: Not on file    Minutes per session: Not on file  . Stress: Not on file  Relationships  . Social connections:    Talks on phone: Not on file    Gets together: Not on file    Attends religious service: Not on file    Active member of club or organization: Not on file    Attends meetings of clubs or organizations: Not on file    Relationship status: Not on file  Other Topics Concern  . Not on file  Social History Narrative  . Not on file    Outpatient Encounter Medications as of 11/02/2017  Medication Sig  . aspirin 81 MG EC tablet TAKE 1 TABLET BY MOUTH DAILY  . buPROPion (WELLBUTRIN XL) 150 MG 24 hr tablet Take 1-2 tablets per day as directed.  (tapering dose)  . omeprazole (PRILOSEC) 20 MG capsule Take 20 mg by mouth daily.  . vitamin B-12 (CYANOCOBALAMIN) 1000 MCG tablet Take by mouth.  . [DISCONTINUED] buPROPion (WELLBUTRIN XL) 300 MG 24 hr tablet TAKE 1 TABLET (300 MG TOTAL) BY MOUTH DAILY.  . [DISCONTINUED] levETIRAcetam (KEPPRA) 500 MG tablet Take 1 tablet (500 mg total) by mouth 2 (two) times daily. (Patient taking differently: Take 250 mg by mouth 2 (two) times daily. )   No facility-administered encounter medications on file as of 11/02/2017.     Review of Systems  Constitutional: Negative for appetite change and unexpected weight change.  HENT: Negative for congestion and sinus pressure.   Respiratory: Negative for cough, chest tightness and shortness of breath.   Cardiovascular: Negative for chest pain, palpitations and leg swelling.  Gastrointestinal: Negative for abdominal pain, diarrhea, nausea and vomiting.  Genitourinary: Negative for difficulty urinating and dysuria.  Musculoskeletal: Negative for joint swelling and myalgias.  Skin: Negative for color change and rash.  Neurological: Positive for dizziness, light-headedness and numbness.  Psychiatric/Behavioral: Negative for  agitation and dysphoric mood.       Objective:     Not orthostatic on exam.    Physical Exam  Constitutional: She appears well-developed and well-nourished. No distress.  HENT:  Nose: Nose normal.  Mouth/Throat: Oropharynx is clear and moist.  Neck: Neck supple. No thyromegaly present.  Cardiovascular: Normal rate and regular rhythm.  Pulmonary/Chest: Breath sounds normal. No respiratory distress. She has no wheezes.  Abdominal: Soft. Bowel sounds are normal. There is no tenderness.  Musculoskeletal: She exhibits no edema or tenderness.  Lymphadenopathy:    She has no cervical adenopathy.  Skin: No rash noted. No erythema.  Psychiatric: She has a normal mood and affect. Her behavior is normal.    BP 130/78 (  BP Location: Left Arm, Patient Position: Sitting, Cuff Size: Large)   Pulse 89   Temp 98.7 F (37.1 C) (Oral)   Resp 18   Wt 266 lb 3.2 oz (120.7 kg)   SpO2 97%   BMI 41.69 kg/m  Wt Readings from Last 3 Encounters:  11/02/17 266 lb 3.2 oz (120.7 kg)  05/04/17 267 lb 3.2 oz (121.2 kg)  02/19/17 260 lb (117.9 kg)     Lab Results  Component Value Date   WBC 8.4 11/02/2017   HGB 11.4 (L) 11/02/2017   HCT 33.8 (L) 11/02/2017   PLT 336.0 11/02/2017   GLUCOSE 104 (H) 11/02/2017   CHOL 136 05/01/2017   TRIG 122.0 05/01/2017   HDL 49.80 05/01/2017   LDLCALC 62 05/01/2017   ALT 20 11/02/2017   AST 15 11/02/2017   NA 139 11/02/2017   K 4.2 11/02/2017   CL 106 11/02/2017   CREATININE 0.81 11/02/2017   BUN 16 11/02/2017   CO2 26 11/02/2017   TSH 2.59 05/01/2017   INR 1.04 10/16/2016   HGBA1C 5.4 10/17/2016    Mm 3d Screen Breast Bilateral  Result Date: 10/05/2017 CLINICAL DATA:  Screening. EXAM: DIGITAL SCREENING BILATERAL MAMMOGRAM WITH TOMO AND CAD COMPARISON:  Previous exam(s). ACR Breast Density Category c: The breast tissue is heterogeneously dense, which may obscure small masses. FINDINGS: There are no findings suspicious for malignancy. Images were  processed with CAD. Loop recorder overlies the left medial breast. IMPRESSION: No mammographic evidence of malignancy. A result letter of this screening mammogram will be mailed directly to the patient. RECOMMENDATION: Screening mammogram in one year. (Code:SM-B-01Y) BI-RADS CATEGORY  1: Negative. Electronically Signed   By: Fidela Salisbury M.D.   On: 10/05/2017 19:30       Assessment & Plan:   Problem List Items Addressed This Visit    CVA (cerebral vascular accident) [facility])    Recent MRI revealed old parietal infarct.  No acute abnormality.  Continue aspirin.  Seeing neurology.        Dizziness    Dizziness and numbness/tingling as outlined.  Unclear etiology.  Just saw neurology.  MRI brain and c-spine as outlined.  Taper off wellbutrin.  No known triggers.  Has medrtonic linq. Discussed referral back to cardiology to confirm no further cardiac w/up warranted.  D/w neurology regarding further evaluation.       Relevant Orders   Comprehensive metabolic panel (Completed)   Ambulatory referral to Cardiology   Seizure [facility])    Previously admitted with unresponsive episode.  Concern initially over possible seizure.  Had negative EEG.  Off keppra.  Has had the episodes as outlined.  Described as dizziness, numbness and tingling.  Just saw neurology.  Had MRI.  No acute abnormality.  Will taper off wellbutrin.  c-spine MRI as outlined.  Question if contributing.  D/w neurology.       Situational anxiety    Has down well on wellbutrin.  Discussed tapering off given concerns regarding possible seizure history.  She feels she get anxious when the episodes occur, but otherwise feels she is doing well.  Discussed changing wellbutrin to lexapro.  She declines.  Wants to taper off and see how she does.   Follow.       Relevant Medications   buPROPion (WELLBUTRIN XL) 150 MG 24 hr tablet    Other Visit Diagnoses    Anemia, unspecified type       Recheck cbc and iron studies.     Relevant  Medications   vitamin B-12 (CYANOCOBALAMIN) 1000 MCG tablet   Other Relevant Orders   CBC with Differential/Platelet (Completed)   Ferritin (Completed)   Vitamin B12 (Completed)   IBC panel (Completed)       Einar Pheasant, MD

## 2017-11-02 NOTE — Progress Notes (Signed)
Order placed for f/u labs.  

## 2017-11-03 ENCOUNTER — Encounter: Payer: Self-pay | Admitting: Internal Medicine

## 2017-11-03 DIAGNOSIS — R42 Dizziness and giddiness: Secondary | ICD-10-CM | POA: Insufficient documentation

## 2017-11-03 NOTE — Assessment & Plan Note (Signed)
Has down well on wellbutrin.  Discussed tapering off given concerns regarding possible seizure history.  She feels she get anxious when the episodes occur, but otherwise feels she is doing well.  Discussed changing wellbutrin to lexapro.  She declines.  Wants to taper off and see how she does.   Follow.

## 2017-11-03 NOTE — Assessment & Plan Note (Signed)
Dizziness and numbness/tingling as outlined.  Unclear etiology.  Just saw neurology.  MRI brain and c-spine as outlined.  Taper off wellbutrin.  No known triggers.  Has medrtonic linq. Discussed referral back to cardiology to confirm no further cardiac w/up warranted.  D/w neurology regarding further evaluation.

## 2017-11-03 NOTE — Assessment & Plan Note (Signed)
Recent MRI revealed old parietal infarct.  No acute abnormality.  Continue aspirin.  Seeing neurology.

## 2017-11-03 NOTE — Assessment & Plan Note (Signed)
Previously admitted with unresponsive episode.  Concern initially over possible seizure.  Had negative EEG.  Off keppra.  Has had the episodes as outlined.  Described as dizziness, numbness and tingling.  Just saw neurology.  Had MRI.  No acute abnormality.  Will taper off wellbutrin.  c-spine MRI as outlined.  Question if contributing.  D/w neurology.

## 2017-11-12 ENCOUNTER — Other Ambulatory Visit: Payer: Self-pay | Admitting: Internal Medicine

## 2017-11-19 ENCOUNTER — Encounter: Payer: Self-pay | Admitting: Internal Medicine

## 2017-11-20 NOTE — Telephone Encounter (Signed)
I will forward to Dr Nicki Reaper to review and determine tapering process.

## 2017-11-20 NOTE — Telephone Encounter (Signed)
Patient decreased from 300 mg q day to 150 mg q day. She started this on 7/11 and was told to do it for about it. She is wondering what she should do next. Should she decrease to every other day? Sending this to Dr. Caryl Bis and Dr. Nicki Reaper. Wasn't sure if Dr. Nicki Reaper would see it in time for me to advise the patient today.

## 2017-11-22 NOTE — Telephone Encounter (Signed)
Already addressed.  See other my chart response.

## 2017-12-01 ENCOUNTER — Other Ambulatory Visit (INDEPENDENT_AMBULATORY_CARE_PROVIDER_SITE_OTHER): Payer: 59

## 2017-12-01 DIAGNOSIS — D649 Anemia, unspecified: Secondary | ICD-10-CM

## 2017-12-01 LAB — CBC WITH DIFFERENTIAL/PLATELET
BASOS ABS: 0.1 10*3/uL (ref 0.0–0.1)
BASOS PCT: 0.8 % (ref 0.0–3.0)
Eosinophils Absolute: 0.2 10*3/uL (ref 0.0–0.7)
Eosinophils Relative: 1.9 % (ref 0.0–5.0)
HEMATOCRIT: 36.7 % (ref 36.0–46.0)
Hemoglobin: 12.2 g/dL (ref 12.0–15.0)
LYMPHS ABS: 2.1 10*3/uL (ref 0.7–4.0)
Lymphocytes Relative: 23.4 % (ref 12.0–46.0)
MCHC: 33.2 g/dL (ref 30.0–36.0)
MCV: 81.2 fl (ref 78.0–100.0)
MONOS PCT: 8.7 % (ref 3.0–12.0)
Monocytes Absolute: 0.8 10*3/uL (ref 0.1–1.0)
NEUTROS ABS: 5.9 10*3/uL (ref 1.4–7.7)
NEUTROS PCT: 65.2 % (ref 43.0–77.0)
PLATELETS: 338 10*3/uL (ref 150.0–400.0)
RBC: 4.52 Mil/uL (ref 3.87–5.11)
RDW: 16.8 % — AB (ref 11.5–15.5)
WBC: 9.1 10*3/uL (ref 4.0–10.5)

## 2017-12-01 LAB — FERRITIN: Ferritin: 24.6 ng/mL (ref 10.0–291.0)

## 2017-12-02 ENCOUNTER — Encounter: Payer: Self-pay | Admitting: Internal Medicine

## 2017-12-02 ENCOUNTER — Other Ambulatory Visit: Payer: Self-pay | Admitting: Internal Medicine

## 2017-12-17 IMAGING — DX DG CHEST 1V PORT
1 series · 1 of 1 positions shown · non-contrast
Comparison: None.

CLINICAL DATA: ETT

EXAM:
PORTABLE CHEST 1 VIEW

[chest ap]
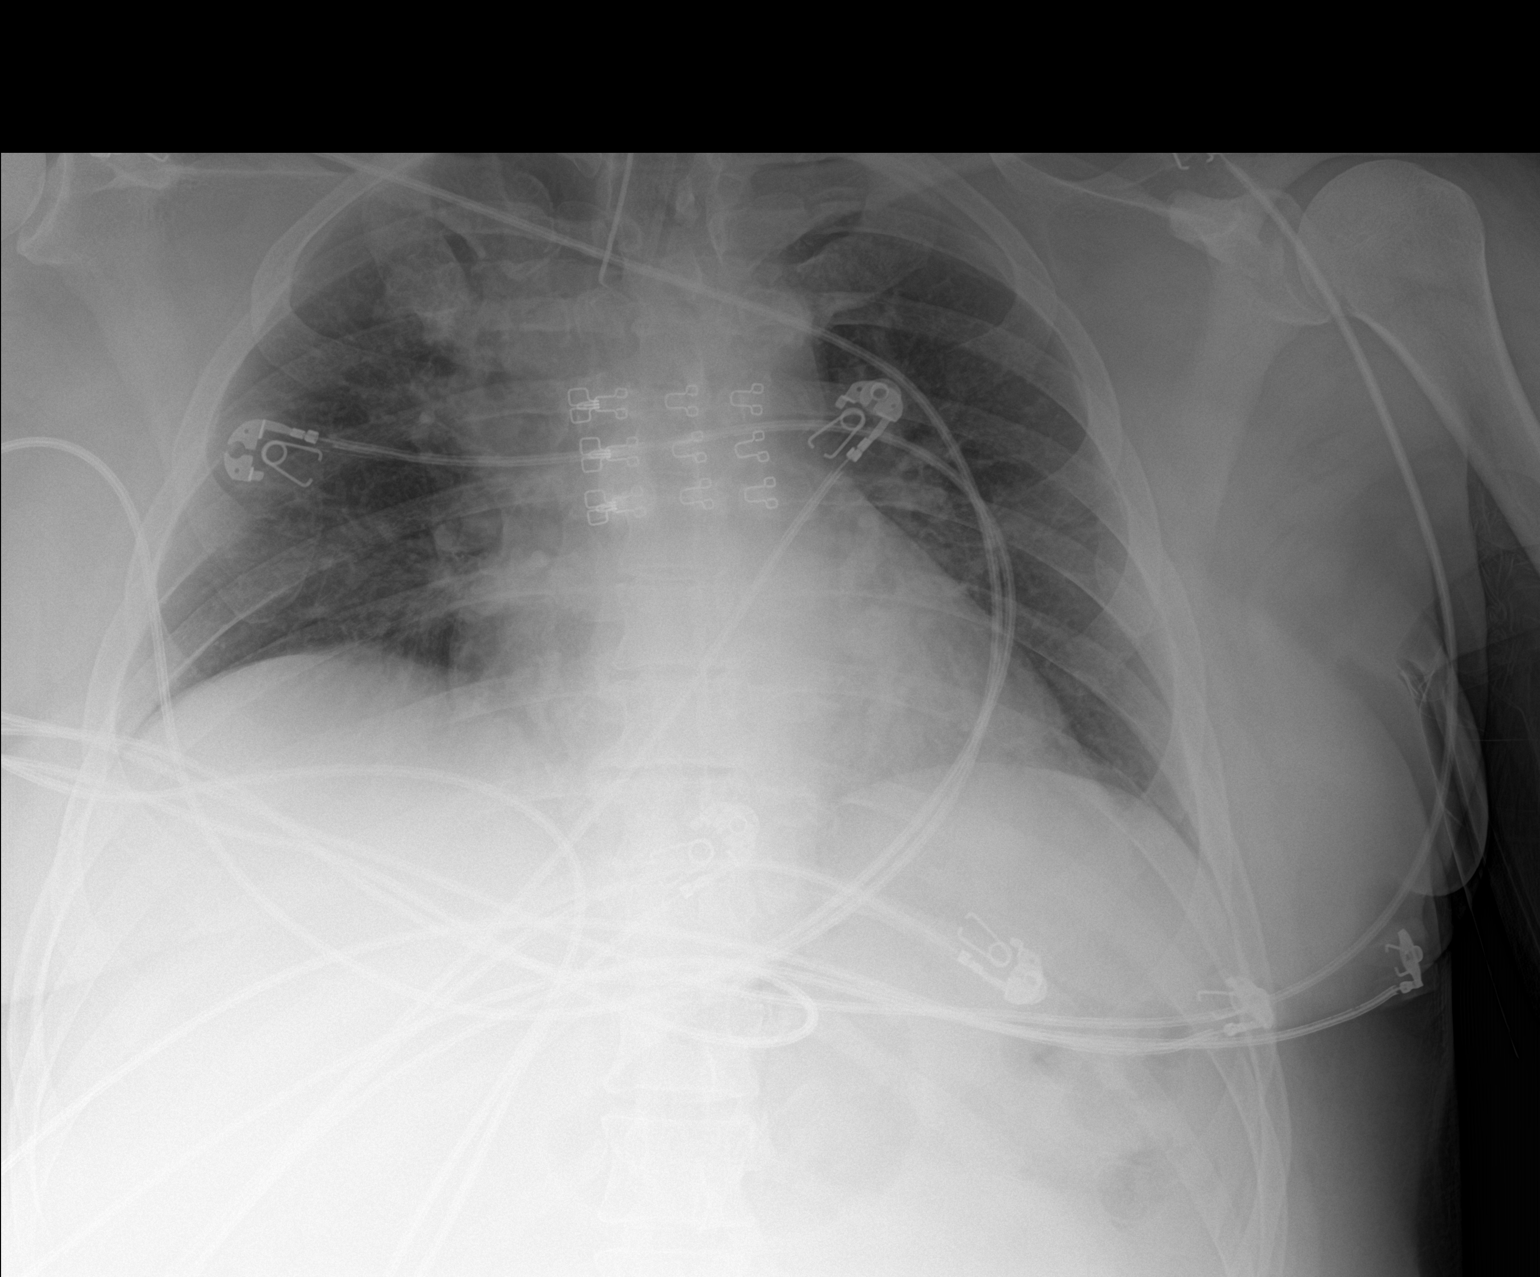

[1 of 1 positions shown; findings below may reference images not displayed]

FINDINGS: Endotracheal tube tip is approximately 2.2 cm superior to the
carina. There are low lung volumes. The heart size is upper limits
of normal. Prominent upper mediastinum with vague opacity in the
right suprahilar region. No pneumothorax.
IMPRESSION: 1. Endotracheal tube tip about 2.2 cm superior to carina
2. Low lung volumes. Vague right suprahilar and paramediastinal
opacity, cannot exclude infiltrate or mass. Consider CT for further
evaluation.

## 2017-12-21 ENCOUNTER — Other Ambulatory Visit: Payer: Self-pay | Admitting: Internal Medicine

## 2018-05-06 ENCOUNTER — Other Ambulatory Visit (HOSPITAL_COMMUNITY)
Admission: RE | Admit: 2018-05-06 | Discharge: 2018-05-06 | Disposition: A | Discharge status: Home / Self Care | Payer: Managed Care, Other (non HMO) | Source: Ambulatory Visit | Attending: Internal Medicine | Admitting: Internal Medicine

## 2018-05-06 ENCOUNTER — Encounter: Payer: Self-pay | Admitting: Internal Medicine

## 2018-05-06 ENCOUNTER — Ambulatory Visit (INDEPENDENT_AMBULATORY_CARE_PROVIDER_SITE_OTHER): Payer: Managed Care, Other (non HMO) | Admitting: Internal Medicine

## 2018-05-06 VITALS — BP 122/78 | HR 100 | Temp 98.3°F | Resp 16 | Ht 67.0 in | Wt 273.2 lb

## 2018-05-06 DIAGNOSIS — Z8673 Personal history of transient ischemic attack (TIA), and cerebral infarction without residual deficits: Secondary | ICD-10-CM | POA: Diagnosis not present

## 2018-05-06 DIAGNOSIS — R569 Unspecified convulsions: Secondary | ICD-10-CM

## 2018-05-06 DIAGNOSIS — Z0000 Encounter for general adult medical examination without abnormal findings: Secondary | ICD-10-CM | POA: Diagnosis not present

## 2018-05-06 DIAGNOSIS — Z6841 Body Mass Index (BMI) 40.0 and over, adult: Secondary | ICD-10-CM

## 2018-05-06 DIAGNOSIS — Z124 Encounter for screening for malignant neoplasm of cervix: Secondary | ICD-10-CM | POA: Diagnosis not present

## 2018-05-06 DIAGNOSIS — E611 Iron deficiency: Secondary | ICD-10-CM

## 2018-05-06 DIAGNOSIS — Z13220 Encounter for screening for lipoid disorders: Secondary | ICD-10-CM

## 2018-05-06 LAB — COMPREHENSIVE METABOLIC PANEL
ALBUMIN: 4.1 g/dL (ref 3.5–5.2)
ALT: 25 U/L (ref 0–35)
AST: 22 U/L (ref 0–37)
Alkaline Phosphatase: 88 U/L (ref 39–117)
BUN: 16 mg/dL (ref 6–23)
CO2: 24 mEq/L (ref 19–32)
Calcium: 9.6 mg/dL (ref 8.4–10.5)
Chloride: 102 mEq/L (ref 96–112)
Creatinine, Ser: 0.78 mg/dL (ref 0.40–1.20)
GFR: 84.25 mL/min (ref >60.00)
Glucose, Bld: 96 mg/dL (ref 70–99)
Potassium: 4.2 mEq/L (ref 3.5–5.1)
SODIUM: 136 meq/L (ref 135–145)
TOTAL PROTEIN: 7.6 g/dL (ref 6.0–8.3)
Total Bilirubin: 0.4 mg/dL (ref 0.2–1.2)

## 2018-05-06 LAB — LIPID PANEL
CHOL/HDL RATIO: 3
Cholesterol: 157 mg/dL (ref 0–200)
HDL: 50.4 mg/dL (ref >39.00)
LDL CALC: 83 mg/dL (ref 0–99)
NONHDL: 106.47
TRIGLYCERIDES: 115 mg/dL (ref 0.0–149.0)
VLDL: 23 mg/dL (ref 0.0–40.0)

## 2018-05-06 LAB — CBC WITH DIFFERENTIAL/PLATELET
BASOS ABS: 0.1 10*3/uL (ref 0.0–0.1)
BASOS PCT: 0.8 % (ref 0.0–3.0)
EOS ABS: 0.1 10*3/uL (ref 0.0–0.7)
Eosinophils Relative: 1 % (ref 0.0–5.0)
HEMATOCRIT: 39.3 % (ref 36.0–46.0)
Hemoglobin: 12.9 g/dL (ref 12.0–15.0)
Lymphocytes Relative: 19.1 % (ref 12.0–46.0)
Lymphs Abs: 1.9 10*3/uL (ref 0.7–4.0)
MCHC: 32.7 g/dL (ref 30.0–36.0)
MCV: 80.3 fl (ref 78.0–100.0)
Monocytes Absolute: 0.7 10*3/uL (ref 0.1–1.0)
Monocytes Relative: 7.2 % (ref 3.0–12.0)
Neutro Abs: 7.1 10*3/uL (ref 1.4–7.7)
Neutrophils Relative %: 71.9 % (ref 43.0–77.0)
Platelets: 331 10*3/uL (ref 150.0–400.0)
RBC: 4.9 Mil/uL (ref 3.87–5.11)
RDW: 16.1 % — ABNORMAL HIGH (ref 11.5–15.5)
WBC: 9.9 10*3/uL (ref 4.0–10.5)

## 2018-05-06 LAB — IBC PANEL
Iron: 79 ug/dL (ref 42–145)
Saturation Ratios: 17.5 % — ABNORMAL LOW (ref 20.0–50.0)
Transferrin: 323 mg/dL (ref 212.0–360.0)

## 2018-05-06 LAB — FERRITIN: Ferritin: 13.5 ng/mL (ref 10.0–291.0)

## 2018-05-06 LAB — TSH: TSH: 1.82 u[IU]/mL (ref 0.35–4.50)

## 2018-05-06 MED ORDER — OMEPRAZOLE 20 MG PO CPDR: 20.0000 mg | DELAYED_RELEASE_CAPSULE | Freq: Every day | ORAL | 1 refills | Status: DC

## 2018-05-06 NOTE — Assessment & Plan Note (Signed)
Physical today 05/06/18.  PAP 05/06/18.  Mammogram 10/05/17 - birads I.

## 2018-05-06 NOTE — Progress Notes (Signed)
Patient ID: Gwendolyn Torres, female   DOB: 1970/10/31, 48 y.o.   MRN: [id number]   Subjective:    Patient ID: Gwendolyn Torres, female    DOB: 16-Feb-1971, 48 y.o.   MRN: [id number]  HPI  Patient here for her physical exam.  She reports she is doing well.  No further "episodes".  No seizures.  Trying to stay active.  Has joined [employer].  Starting [employer].  No chest pain.  No sob.  No acid reflux.  No abdominal pain.  Bowels moving.  No urine change.  Period:  12/2017, 02/2018 and 04/2018.  Handling stress.  Doing well off wellbutrin.     Past Medical History:  Diagnosis Date  . GERD (gastroesophageal reflux disease)    Past Surgical History:  Procedure Laterality Date  . BUNIONECTOMY Right 09/25/2016   Procedure: lapidus type  modified mcbride;  Surgeon: Albertine Patricia, DPM;  Location: [place];  Service: Podiatry;  Laterality: Right;  . CESAREAN SECTION  2004  . LOOP RECORDER INSERTION N/A 02/19/2017   Procedure: LOOP RECORDER INSERTION;  Surgeon: Isaias Cowman, MD;  Location: [place] [facility];  Service: Cardiovascular;  Laterality: N/A;  . OSTECTOMY Right 09/25/2016   Procedure: OSTECTOMY RT  1SR,2ND 3RD  metatarsal;  Surgeon: Albertine Patricia, DPM;  Location: [place];  Service: Podiatry;  Laterality: Right;  LMA LOCAL  . TEE WITHOUT CARDIOVERSION N/A 10/28/2016   Procedure: TRANSESOPHAGEAL ECHOCARDIOGRAM (TEE);  Surgeon: Teodoro Spray, MD;  Location: [facility];  Service: Cardiovascular;  Laterality: N/A;   Family History  Problem Relation Age of Onset  . Aortic aneurysm Father        also has popliteal aneurysm  . Hypertension Father   . Hypercholesterolemia Father   . Diabetes Father   . Aortic aneurysm Mother   . Hypercholesterolemia Mother   . Hypercholesterolemia Sister        x2  . Hyperlipidemia Brother   . Hypertension Sister   . Breast cancer Maternal Aunt   . Melanoma Maternal Uncle   . Colon cancer Neg Hx    Social History     Socioeconomic History  . Marital status: Married    Spouse name: Not on file  . Number of children: 1  . Years of education: Not on file  . Highest education level: Not on file  Occupational History  . Occupation: Location manager  Social Needs  . Financial resource strain: Not on file  . Food insecurity:    Worry: Not on file    Inability: Not on file  . Transportation needs:    Medical: Not on file    Non-medical: Not on file  Tobacco Use  . Smoking status: Never Smoker  . Smokeless tobacco: Never Used  Substance and Sexual Activity  . Alcohol use: No    Alcohol/week: 0.0 standard drinks  . Drug use: No  . Sexual activity: Yes    Birth control/protection: None  Lifestyle  . Physical activity:    Days per week: Not on file    Minutes per session: Not on file  . Stress: Not on file  Relationships  . Social connections:    Talks on phone: Not on file    Gets together: Not on file    Attends religious service: Not on file    Active member of club or organization: Not on file    Attends meetings of clubs or organizations: Not on file    Relationship status: Not  on file  Other Topics Concern  . Not on file  Social History Narrative  . Not on file    Outpatient Encounter Medications as of 05/06/2018  Medication Sig  . aspirin 81 MG EC tablet TAKE 1 TABLET BY MOUTH DAILY  . omeprazole (PRILOSEC) 20 MG capsule Take 1 capsule (20 mg total) by mouth daily.  . [DISCONTINUED] buPROPion (WELLBUTRIN XL) 150 MG 24 hr tablet TAKE 1-2 TABLETS PER DAY AS DIRECTED. (TAPERING DOSE)  . [DISCONTINUED] omeprazole (PRILOSEC) 20 MG capsule Take 20 mg by mouth daily.  . [DISCONTINUED] vitamin B-12 (CYANOCOBALAMIN) 1000 MCG tablet Take by mouth.   No facility-administered encounter medications on file as of 05/06/2018.     Review of Systems  Constitutional: Negative for appetite change and unexpected weight change.  HENT: Negative for congestion and sinus pressure.   Eyes: Negative  for pain and visual disturbance.  Respiratory: Negative for cough, chest tightness and shortness of breath.   Cardiovascular: Negative for chest pain, palpitations and leg swelling.  Gastrointestinal: Negative for abdominal pain, diarrhea, nausea and vomiting.  Genitourinary: Negative for difficulty urinating and dysuria.  Musculoskeletal: Negative for joint swelling and myalgias.  Skin: Negative for color change and rash.  Neurological: Negative for dizziness, light-headedness and headaches.  Hematological: Negative for adenopathy. Does not bruise/bleed easily.  Psychiatric/Behavioral: Negative for agitation and dysphoric mood.       Objective:    Physical Exam Constitutional:      General: She is not in acute distress.    Appearance: Normal appearance. She is well-developed.  HENT:     Nose: Nose normal. No congestion.  Eyes:     General: No scleral icterus.       Right eye: No discharge.        Left eye: No discharge.  Neck:     Musculoskeletal: Neck supple. No muscular tenderness.     Thyroid: No thyromegaly.  Cardiovascular:     Rate and Rhythm: Normal rate and regular rhythm.  Pulmonary:     Effort: No tachypnea, accessory muscle usage or respiratory distress.     Breath sounds: Normal breath sounds. No decreased breath sounds, wheezing or rhonchi.  Chest:     Breasts:        Right: No inverted nipple, mass, nipple discharge or tenderness (no axillary adenopathy).        Left: No inverted nipple, mass, nipple discharge or tenderness (no axilarry adenopathy).  Abdominal:     General: Bowel sounds are normal.     Palpations: Abdomen is soft.     Tenderness: There is no abdominal tenderness.  Genitourinary:    Comments: Normal external genitalia.  Vaginal vault without lesions.  Cervix identified.  Pap smear performed.  Could not appreciate any adnexal masses or tenderness.   Musculoskeletal:        General: No swelling or tenderness.  Lymphadenopathy:     Cervical:  No cervical adenopathy.  Skin:    Findings: No erythema or rash.  Neurological:     Mental Status: She is alert and oriented to person, place, and time.  Psychiatric:        Mood and Affect: Mood normal.        Behavior: Behavior normal.     BP 122/78   Pulse 100   Temp 98.3 F (36.8 C) (Oral)   Resp 16   Ht 5\' 7"  (1.702 m)   Wt 273 lb 3.2 oz (123.9 kg)   SpO2 98%  BMI 42.79 kg/m  Wt Readings from Last 3 Encounters:  05/06/18 273 lb 3.2 oz (123.9 kg)  11/02/17 266 lb 3.2 oz (120.7 kg)  05/04/17 267 lb 3.2 oz (121.2 kg)     Lab Results  Component Value Date   WBC 9.9 05/06/2018   HGB 12.9 05/06/2018   HCT 39.3 05/06/2018   PLT 331.0 05/06/2018   GLUCOSE 96 05/06/2018   CHOL 157 05/06/2018   TRIG 115.0 05/06/2018   HDL 50.40 05/06/2018   LDLCALC 83 05/06/2018   ALT 25 05/06/2018   AST 22 05/06/2018   NA 136 05/06/2018   K 4.2 05/06/2018   CL 102 05/06/2018   CREATININE 0.78 05/06/2018   BUN 16 05/06/2018   CO2 24 05/06/2018   TSH 1.82 05/06/2018   INR 1.04 10/16/2016   HGBA1C 5.4 10/17/2016    Mm 3d Screen Breast Bilateral  Result Date: 10/05/2017 CLINICAL DATA:  Screening. EXAM: DIGITAL SCREENING BILATERAL MAMMOGRAM WITH TOMO AND CAD COMPARISON:  Previous exam(s). ACR Breast Density Category c: The breast tissue is heterogeneously dense, which may obscure small masses. FINDINGS: There are no findings suspicious for malignancy. Images were processed with CAD. Loop recorder overlies the left medial breast. IMPRESSION: No mammographic evidence of malignancy. A result letter of this screening mammogram will be mailed directly to the patient. RECOMMENDATION: Screening mammogram in one year. (Code:SM-B-01Y) BI-RADS CATEGORY  1: Negative. Electronically Signed   By: Fidela Salisbury M.D.   On: 10/05/2017 19:30       Assessment & Plan:   Problem List Items Addressed This Visit    BMI 40.0-44.9, adult ([place])    Discussed diet and exercise.  Follow.         Health care maintenance    Physical today 05/06/18.  PAP 05/06/18.  Mammogram 10/05/17 - birads I.        History of CVA (cerebrovascular accident)    Continue aspirin.  Seeing neurology.       Relevant Orders   Comprehensive metabolic panel (Completed)   Lipid panel (Completed)   TSH (Completed)   Iron deficiency    Follow cbc and ferritin.       Relevant Orders   CBC with Differential/Platelet (Completed)   Ferritin (Completed)   IBC panel (Completed)   Seizure ([place])    Previously admitted with unresponsive attitude.  Negative EEG.  Off keppra.  No recurring episodes.  Continue f/u with neurology.         Other Visit Diagnoses    Routine general medical examination at a health care facility    -  Primary   Cervical cancer screening       Relevant Orders   Cytology - PAP( [place]) (Completed)   Screening cholesterol level           Einar Pheasant, MD

## 2018-05-07 LAB — CYTOLOGY - PAP
DIAGNOSIS: NEGATIVE
HPV: NOT DETECTED

## 2018-05-08 ENCOUNTER — Encounter: Payer: Self-pay | Admitting: Internal Medicine

## 2018-05-08 NOTE — Assessment & Plan Note (Signed)
Follow cbc and ferritin.  

## 2018-05-08 NOTE — Assessment & Plan Note (Signed)
Previously admitted with unresponsive attitude.  Negative EEG.  Off keppra.  No recurring episodes.  Continue f/u with neurology.

## 2018-05-08 NOTE — Assessment & Plan Note (Signed)
Discussed diet and exercise.  Follow.  

## 2018-05-08 NOTE — Assessment & Plan Note (Signed)
Continue aspirin.  Seeing neurology.

## 2018-05-09 ENCOUNTER — Encounter: Payer: Self-pay | Admitting: Internal Medicine

## 2018-10-21 IMAGING — MR MR HEAD WO/W CM
13 series · 48 of 48 positions shown · IV contrast (20mL MULTIHANCE)
Comparison: MRI of the head [DATE] and MRI of the cervical
spine October 17, 2016

CLINICAL DATA: Numbness LEFT face and arm [REDACTED]. Intermittent
pinky numbness increasing in frequency. Assess seizure like
activity.

EXAM:
MRI HEAD WITHOUT AND WITH CONTRAST
TECHNIQUE: Multiplanar, multiecho pulse sequences of the brain and surrounding
structures were obtained without and with intravenous contrast.
CONTRAST:  20mL MULTIHANCE GADOBENATE DIMEGLUMINE 529 MG/ML IV SOLN

[Series 2: T1 · sagittal · 5.0mm · 0.45mm/px · 1 of 25 slices shown (1 of 2)]
[im 1/25]
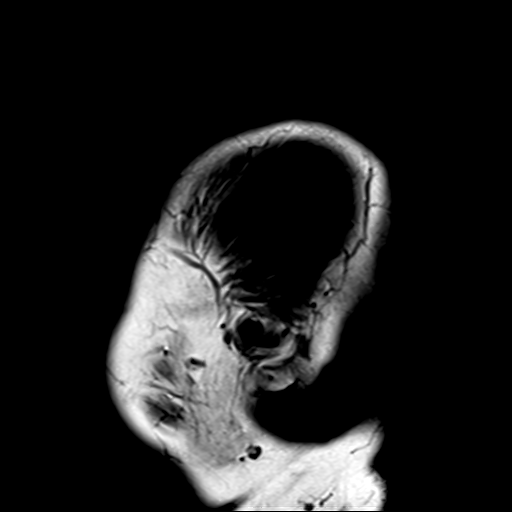

[Series 4: DWI · axial · 3.0mm · 1.80mm/px · z∈[-98,+59]mm · 3 of 55 slices shown (1 of 4)]
[im 1/55]
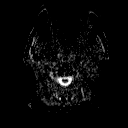
[im 28/55]
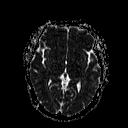
[im 55/55]
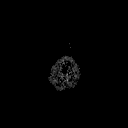

[Series 5: DWI · coronal · 3.0mm · 1.80mm/px · 8 of 133 slices shown (2 of 4)]
[im 1/133]
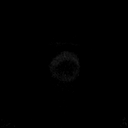
[im 19/133]
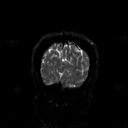
[im 38/133]
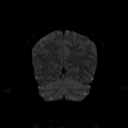
[im 57/133]
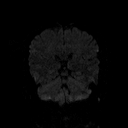
[im 76/133]
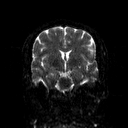
[im 95/133]
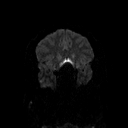
[im 114/133]
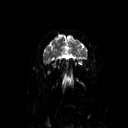
[im 133/133]
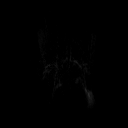

[Series 6: DWI · coronal · 3.0mm · 1.80mm/px · 3 of 45 slices shown (3 of 4)]
[im 1/45]
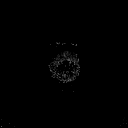
[im 23/45]
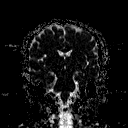
[im 45/45]
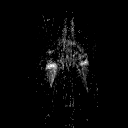

[Series 7: T2 · axial · 5.0mm · 0.60mm/px · 1 of 25 slices shown (1 of 3)]
[im 1/25]
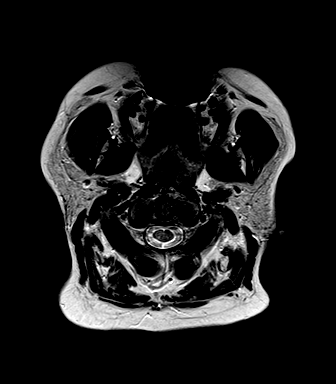

[Series 8: FLAIR · axial · 3.0mm · 0.45mm/px · z∈[-104,+49]mm · 3 of 53 slices shown]
[im 1/53]
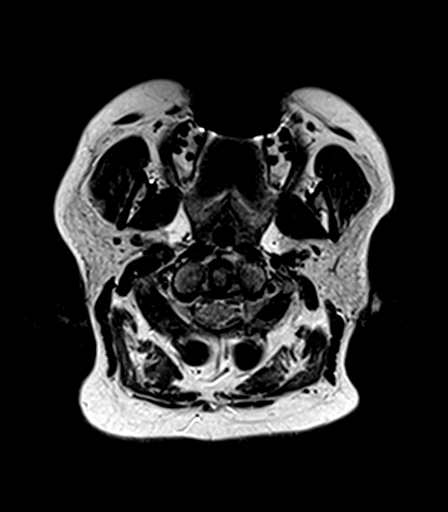
[im 27/53]
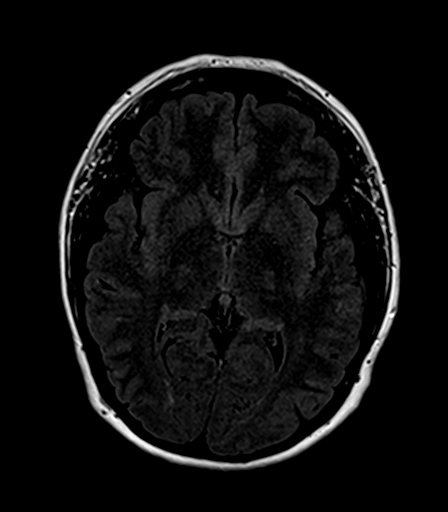
[im 53/53]
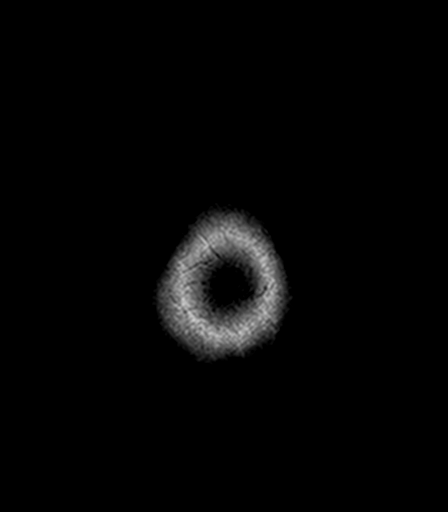

[Series 9: T2 · axial · 5.0mm · 0.45mm/px · 1 of 25 slices shown (2 of 3)]
[im 1/25]
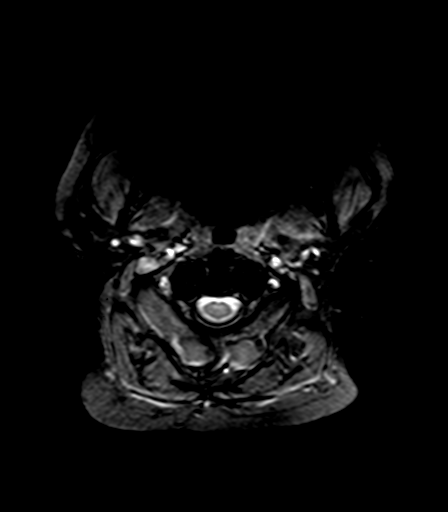

[Series 10: T1 · axial · 1.0mm · 1.00mm/px · z∈[-110,+61]mm · 10 of 176 slices shown (2 of 2)]
[im 1/176]
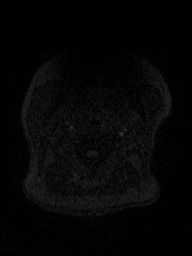
[im 20/176]
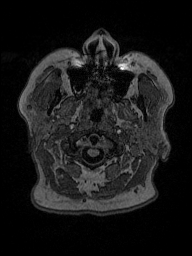
[im 39/176]
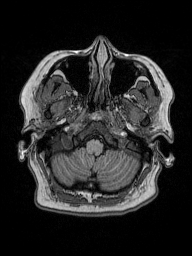
[im 59/176]
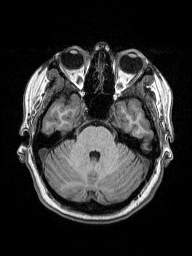
[im 78/176]
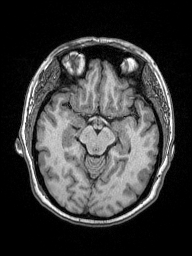
[im 98/176]
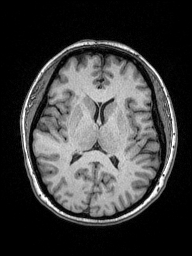
[im 117/176]
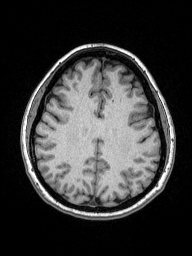
[im 137/176]
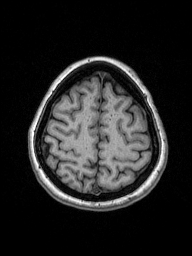
[im 156/176]
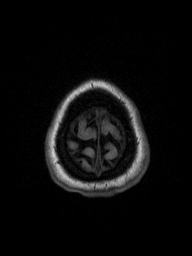
[im 176/176]
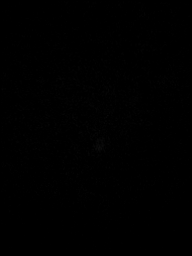

[Series 11: T2 · coronal · 4.0mm · 0.60mm/px · 1 of 19 slices shown (3 of 3)]
[im 1/19]
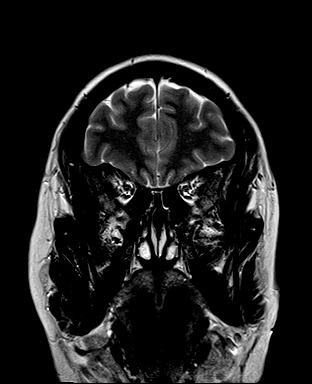

[Series 12: T2 post-contrast · coronal · 5.0mm · 0.49mm/px · 2 of 27 slices shown]
[im 1/27]
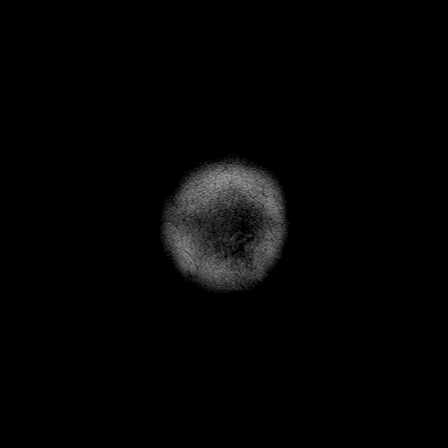
[im 27/27]
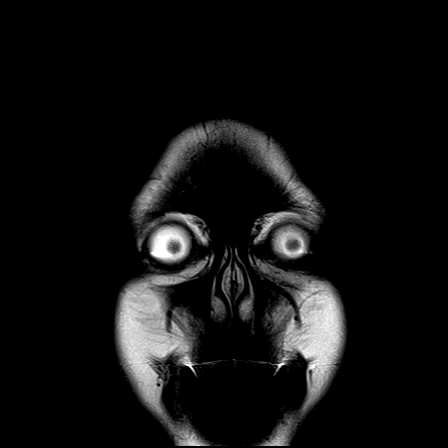

[Series 13: T1 post-contrast · axial · 1.0mm · 1.00mm/px · z∈[-110,+61]mm · 10 of 176 slices shown (1 of 2)]
[im 1/176]
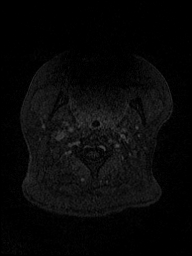
[im 20/176]
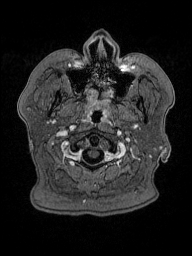
[im 39/176]
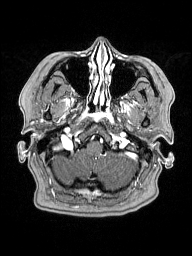
[im 59/176]
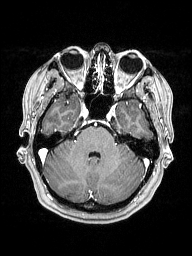
[im 78/176]
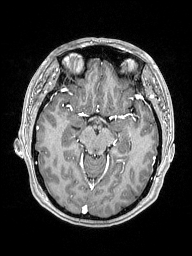
[im 98/176]
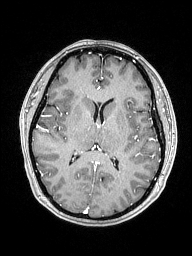
[im 117/176]
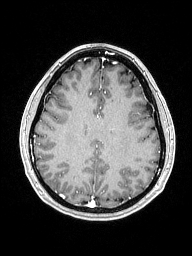
[im 137/176]
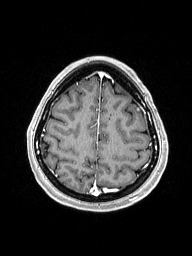
[im 156/176]
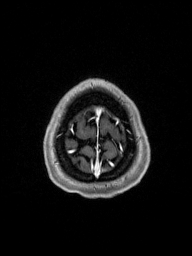
[im 176/176]
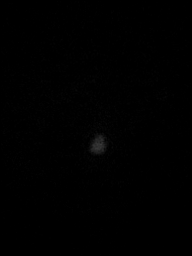

[Series 14: T1 post-contrast · coronal · 5.0mm · 0.43mm/px · 2 of 27 slices shown (2 of 2)]
[im 1/27]
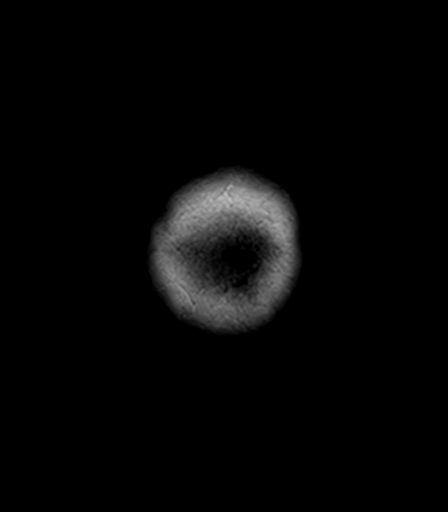
[im 27/27]
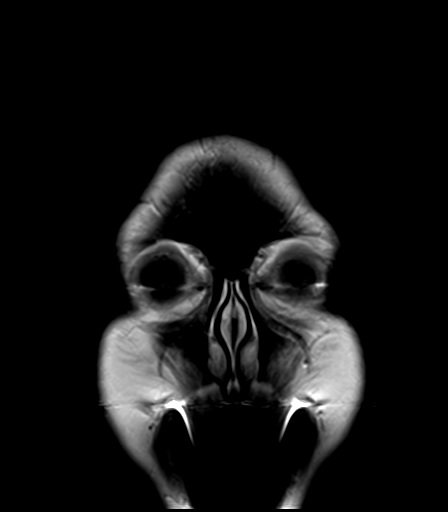

[Series 100: DWI · axial · 3.0mm · 1.80mm/px · z∈[-98,+59]mm · 3 of 55 slices shown (4 of 4)]
[im 1/55]
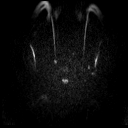
[im 28/55]
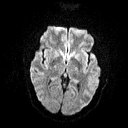
[im 55/55]
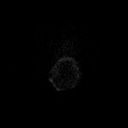

[48 of 48 positions shown; findings below may reference images not displayed]

FINDINGS: INTRACRANIAL CONTENTS: No reduced diffusion to suggest acute
ischemia or hyperacute demyelination. No susceptibility artifact to
suggest hemorrhage. The ventricles and sulci are normal for
patient's age. Subcentimeter RIGHT parietal FLAIR T2 hyperintensity
at site of old infarct. No suspicious parenchymal signal, masses,
mass effect. No abnormal intraparenchymal or extra-axial
enhancement. No abnormal extra-axial fluid collections.
Subcentimeter low T2 enhancing subcentimeter LEFT frontal
extra-axial mass with dural tail, stable from prior MRI. Normal
symmetric hippocampal size, morphology and signal.

VASCULAR: Normal major intracranial vascular flow voids present at
skull base.

SKULL AND UPPER CERVICAL SPINE: No abnormal sellar expansion. No
suspicious calvarial bone marrow signal. Craniocervical junction
maintained.

SINUSES/ORBITS: The mastoid air-cells and included paranasal sinuses
are well-aerated.The included ocular globes and orbital contents are
non-suspicious.

OTHER: None.
IMPRESSION: 1. No acute intracranial process.
2. Tiny old RIGHT parietal lobe infarct.
3. Stable subcentimeter LEFT frontal meningioma.

## 2018-11-08 ENCOUNTER — Other Ambulatory Visit: Payer: Self-pay

## 2018-11-08 ENCOUNTER — Ambulatory Visit (INDEPENDENT_AMBULATORY_CARE_PROVIDER_SITE_OTHER): Payer: Managed Care, Other (non HMO) | Admitting: Internal Medicine

## 2018-11-08 ENCOUNTER — Encounter: Payer: Self-pay | Admitting: Internal Medicine

## 2018-11-08 DIAGNOSIS — Z8673 Personal history of transient ischemic attack (TIA), and cerebral infarction without residual deficits: Secondary | ICD-10-CM

## 2018-11-08 DIAGNOSIS — E611 Iron deficiency: Secondary | ICD-10-CM | POA: Diagnosis not present

## 2018-11-08 DIAGNOSIS — D6851 Activated protein C resistance: Secondary | ICD-10-CM

## 2018-11-08 DIAGNOSIS — Z8669 Personal history of other diseases of the nervous system and sense organs: Secondary | ICD-10-CM

## 2018-11-08 DIAGNOSIS — K219 Gastro-esophageal reflux disease without esophagitis: Secondary | ICD-10-CM

## 2018-11-08 NOTE — Progress Notes (Addendum)
Patient ID: Gwendolyn Torres, female   DOB: 01/05/1971, 48 y.o.   MRN: [id number]   Virtual Visit via video Note  This visit type was conducted due to national recommendations for restrictions regarding the COVID-19 pandemic (e.g. social distancing).  This format is felt to be most appropriate for this patient at this time.  All issues noted in this document were discussed and addressed.  No physical exam was performed (except for noted visual exam findings with Video Visits).   I connected with [name] by a video enabled telemedicine application or telephone and verified that I am speaking with the correct person using two identifiers. Location patient: home Location provider: work Persons participating in the virtual visit: patient, provider  I discussed the limitations, risks, security and privacy concerns of performing an evaluation and management service by video and the availability of in person appointments.  The patient expressed understanding and agreed to proceed.   Reason for visit: scheduled follow up.   HPI: She reports she is doing well.  Feels good.  Increased stress at work with covid, etc.  Discussed with her today.  She feels she is handling things relatively well.  Does not feel needs any further intervention.  Trying to stay active.  No chest pain.  No sob.  Some acid reflux.  Notices more after eating spicy foods or salty foods. Taking omeprazole.  No abdominal pain.  Bowels moving.  No fever. No chest congestion or cough.  No seizures.  No period 3/20, 4/20, 5/20.  Keeping menstrual diary. Not taking iron.  Discussed checking f/u cbc and ferritin.      ROS: See pertinent positives and negatives per HPI.  Past Medical History:  Diagnosis Date  . GERD (gastroesophageal reflux disease)     Past Surgical History:  Procedure Laterality Date  . BUNIONECTOMY Right 09/25/2016   Procedure: lapidus type  modified mcbride;  Surgeon: Albertine Patricia, DPM;  Location: [facility];  Service: Podiatry;  Laterality: Right;  . CESAREAN SECTION  2004  . LOOP RECORDER INSERTION N/A 02/19/2017   Procedure: LOOP RECORDER INSERTION;  Surgeon: Isaias Cowman, MD;  Location: [facility];  Service: Cardiovascular;  Laterality: N/A;  . OSTECTOMY Right 09/25/2016   Procedure: OSTECTOMY RT  1SR,2ND 3RD  metatarsal;  Surgeon: Albertine Patricia, DPM;  Location: [facility];  Service: Podiatry;  Laterality: Right;  LMA LOCAL  . TEE WITHOUT CARDIOVERSION N/A 10/28/2016   Procedure: TRANSESOPHAGEAL ECHOCARDIOGRAM (TEE);  Surgeon: Teodoro Spray, MD;  Location: [facility];  Service: Cardiovascular;  Laterality: N/A;    Family History  Problem Relation Age of Onset  . Aortic aneurysm Father        also has popliteal aneurysm  . Hypertension Father   . Hypercholesterolemia Father   . Diabetes Father   . Aortic aneurysm Mother   . Hypercholesterolemia Mother   . Hypercholesterolemia Sister        x2  . Hyperlipidemia Brother   . Hypertension Sister   . Breast cancer Maternal Aunt   . Melanoma Maternal Uncle   . Colon cancer Neg Hx     SOCIAL HX: reviewed.    Current Outpatient Medications:  .  aspirin 81 MG EC tablet, TAKE 1 TABLET BY MOUTH DAILY, Disp: 90 tablet, Rfl: 1 .  omeprazole (PRILOSEC) 20 MG capsule, Take 1 capsule (20 mg total) by mouth daily., Disp: 90 capsule, Rfl: 1  EXAM:  GENERAL: alert, oriented, appears well and in no  acute distress  HEENT: atraumatic, conjunttiva clear, no obvious abnormalities on inspection of external nose and ears  NECK: normal movements of the head and neck  LUNGS: on inspection no signs of respiratory distress, breathing rate appears normal, no obvious gross SOB, gasping or wheezing  CV: no obvious cyanosis  PSYCH/NEURO: pleasant and cooperative, no obvious depression or anxiety, speech and thought processing grossly intact  ASSESSMENT AND PLAN:  Discussed the following assessment and plan:   History of CVA (cerebrovascular accident) No recurring issues.  Taking aspirin daily.  Has been evaluated by neurology.   Iron deficiency Follow cbc and ferritin.  Not taking iron now.    History of seizure disorder Previously admitted with unresponsive attitude.  Negative EEG.  Off keppra.  No recurring episodes. Follow.   Factor V Leiden mutation ([facility]) Heterozygous.  Evaluated by hematology.  Follow.   GERD (gastroesophageal reflux disease) On omeprazole.  Still with acid reflux as outlined.  Add pepcid in pm.  Follow.  Avoid spicy foods.  Follow.  Call with update.      I discussed the assessment and treatment plan with the patient. The patient was provided an opportunity to ask questions and all were answered. The patient agreed with the plan and demonstrated an understanding of the instructions.   The patient was advised to call back or seek an in-person evaluation if the symptoms worsen or if the condition fails to improve as anticipated.   Einar Pheasant, MD

## 2018-11-12 ENCOUNTER — Telehealth: Payer: Self-pay | Admitting: *Deleted

## 2018-11-12 DIAGNOSIS — D649 Anemia, unspecified: Secondary | ICD-10-CM

## 2018-11-12 NOTE — Telephone Encounter (Signed)
Please place future orders for lab appt.  

## 2018-11-13 ENCOUNTER — Encounter: Payer: Self-pay | Admitting: Internal Medicine

## 2018-11-13 DIAGNOSIS — K219 Gastro-esophageal reflux disease without esophagitis: Secondary | ICD-10-CM | POA: Insufficient documentation

## 2018-11-13 DIAGNOSIS — D6851 Activated protein C resistance: Secondary | ICD-10-CM | POA: Insufficient documentation

## 2018-11-13 NOTE — Telephone Encounter (Signed)
Orders placed for labs

## 2018-11-13 NOTE — Assessment & Plan Note (Signed)
Follow cbc and ferritin.  Not taking iron now.

## 2018-11-13 NOTE — Assessment & Plan Note (Signed)
No recurring issues.  Taking aspirin daily.  Has been evaluated by neurology.

## 2018-11-13 NOTE — Assessment & Plan Note (Signed)
On omeprazole.  Still with acid reflux as outlined.  Add pepcid in pm.  Follow.  Avoid spicy foods.  Follow.  Call with update.

## 2018-11-13 NOTE — Assessment & Plan Note (Signed)
Heterozygous.  Evaluated by hematology.  Follow.

## 2018-11-13 NOTE — Assessment & Plan Note (Signed)
Previously admitted with unresponsive attitude.  Negative EEG.  Off keppra.  No recurring episodes. Follow.

## 2018-11-18 ENCOUNTER — Other Ambulatory Visit (INDEPENDENT_AMBULATORY_CARE_PROVIDER_SITE_OTHER): Payer: Managed Care, Other (non HMO)

## 2018-11-18 ENCOUNTER — Other Ambulatory Visit: Payer: Self-pay

## 2018-11-18 DIAGNOSIS — D649 Anemia, unspecified: Secondary | ICD-10-CM | POA: Diagnosis not present

## 2018-11-18 LAB — CBC WITH DIFFERENTIAL/PLATELET
Basophils Absolute: 0.1 10*3/uL (ref 0.0–0.1)
Basophils Relative: 0.8 % (ref 0.0–3.0)
Eosinophils Absolute: 0.1 10*3/uL (ref 0.0–0.7)
Eosinophils Relative: 1.6 % (ref 0.0–5.0)
HCT: 37.7 % (ref 36.0–46.0)
Hemoglobin: 12.4 g/dL (ref 12.0–15.0)
Lymphocytes Relative: 23.7 % (ref 12.0–46.0)
Lymphs Abs: 1.8 10*3/uL (ref 0.7–4.0)
MCHC: 33 g/dL (ref 30.0–36.0)
MCV: 82.8 fl (ref 78.0–100.0)
Monocytes Absolute: 0.6 10*3/uL (ref 0.1–1.0)
Monocytes Relative: 8.2 % (ref 3.0–12.0)
Neutro Abs: 5.1 10*3/uL (ref 1.4–7.7)
Neutrophils Relative %: 65.7 % (ref 43.0–77.0)
Platelets: 281 10*3/uL (ref 150.0–400.0)
RBC: 4.55 Mil/uL (ref 3.87–5.11)
RDW: 14.7 % (ref 11.5–15.5)
WBC: 7.7 10*3/uL (ref 4.0–10.5)

## 2018-11-18 LAB — FERRITIN: Ferritin: 13.3 ng/mL (ref 10.0–291.0)

## 2018-12-07 ENCOUNTER — Other Ambulatory Visit: Payer: Self-pay | Admitting: Internal Medicine

## 2018-12-07 DIAGNOSIS — Z1231 Encounter for screening mammogram for malignant neoplasm of breast: Secondary | ICD-10-CM

## 2019-01-18 ENCOUNTER — Ambulatory Visit
Admission: RE | Admit: 2019-01-18 | Discharge: 2019-01-18 | Disposition: A | Discharge status: Home / Self Care | Payer: Managed Care, Other (non HMO) | Source: Ambulatory Visit | Attending: Internal Medicine | Admitting: Internal Medicine

## 2019-01-18 DIAGNOSIS — Z1231 Encounter for screening mammogram for malignant neoplasm of breast: Secondary | ICD-10-CM | POA: Insufficient documentation

## 2019-03-01 ENCOUNTER — Encounter: Payer: Self-pay | Admitting: Internal Medicine

## 2019-03-02 ENCOUNTER — Telehealth: Payer: Self-pay

## 2019-03-02 NOTE — Telephone Encounter (Signed)
Copied from [place] [phone]. Topic: Quick [employer] and exercise psychologist Patient (Clinic Use ONLY) >> Mar 02, 2019  3:39 PM Pauline Good wrote: Reason for CRM: pt returning call to office

## 2019-03-02 NOTE — Telephone Encounter (Signed)
Copied from [place] [phone]. Topic: Quick Sport and exercise psychologist Patient (Clinic Use ONLY) >> Mar 02, 2019  3:39 PM Pauline Good wrote: Reason for CRM: pt returning call to office

## 2019-03-02 NOTE — Telephone Encounter (Signed)
I do not mind sending in rx for integra, but may need visit to clarify her other symptoms she mentioned.  Also, to f/u from her last GI appt.  Ok to schedule doxy, etc if agreeable.  Thanks

## 2019-03-02 NOTE — Telephone Encounter (Signed)
LMTCB

## 2019-03-03 NOTE — Telephone Encounter (Signed)
Pt scheduled for virtual.  ?

## 2019-03-03 NOTE — Telephone Encounter (Signed)
Pt scheduled  

## 2019-03-08 ENCOUNTER — Ambulatory Visit (INDEPENDENT_AMBULATORY_CARE_PROVIDER_SITE_OTHER): Payer: Managed Care, Other (non HMO) | Admitting: Internal Medicine

## 2019-03-08 ENCOUNTER — Other Ambulatory Visit: Payer: Self-pay

## 2019-03-08 DIAGNOSIS — R42 Dizziness and giddiness: Secondary | ICD-10-CM | POA: Diagnosis not present

## 2019-03-08 DIAGNOSIS — D649 Anemia, unspecified: Secondary | ICD-10-CM | POA: Diagnosis not present

## 2019-03-08 MED ORDER — INTEGRA 62.5-62.5-40-3 MG PO CAPS: ORAL_CAPSULE | ORAL | 2 refills | Status: DC

## 2019-03-08 NOTE — Progress Notes (Signed)
Patient ID: Gwendolyn Torres, female   DOB: February 02, 1971, 48 y.o.   MRN: [id number]   Virtual Visit via video Note  This visit type was conducted due to national recommendations for restrictions regarding the COVID-19 pandemic (e.g. social distancing).  This format is felt to be most appropriate for this patient at this time.  All issues noted in this document were discussed and addressed.  No physical exam was performed (except for noted visual exam findings with Video Visits).   I connected with [name] by a video enabled telemedicine application and verified that I am speaking with the correct person using two identifiers. Location patient: home Location provider: work Persons participating in the virtual visit: patient, provider  I discussed the limitations, risks, security and privacy concerns of performing an evaluation and management service by video and the availability of in person appointments. The patient expressed understanding and agreed to proceed.   Reason for visit: work in appt.   HPI: Was seen recently by GI - Dawson Bills.  Was having gastritis issues.  Was prescribed omeprazole.  GI symptoms better.  She is having issues around her menstrual cycle.  Notices at the end of her period will occasionally be a little dizzy.  Fingers and feet - a little numb.  Feels more fatigued.  Only notices after her period.  Does not notice at other times.  No chest pain.  No headache.  No sob.  Eating well.  No abdominal pain.  Bowels moving.  LMP two weeks ago.     ROS: See pertinent positives and negatives per HPI.  Past Medical History:  Diagnosis Date  . GERD (gastroesophageal reflux disease)     Past Surgical History:  Procedure Laterality Date  . BUNIONECTOMY Right 09/25/2016   Procedure: lapidus type  modified mcbride;  Surgeon: Albertine Patricia, DPM;  Location: [facility];  Service: Podiatry;  Laterality: Right;  . CESAREAN SECTION  2004  . LOOP RECORDER INSERTION N/A  02/19/2017   Procedure: LOOP RECORDER INSERTION;  Surgeon: Isaias Cowman, MD;  Location: [facility];  Service: Cardiovascular;  Laterality: N/A;  . OSTECTOMY Right 09/25/2016   Procedure: OSTECTOMY RT  1SR,2ND 3RD  metatarsal;  Surgeon: Albertine Patricia, DPM;  Location: [facility];  Service: Podiatry;  Laterality: Right;  LMA LOCAL  . TEE WITHOUT CARDIOVERSION N/A 10/28/2016   Procedure: TRANSESOPHAGEAL ECHOCARDIOGRAM (TEE);  Surgeon: Teodoro Spray, MD;  Location: [facility];  Service: Cardiovascular;  Laterality: N/A;    Family History  Problem Relation Age of Onset  . Aortic aneurysm Father        also has popliteal aneurysm  . Hypertension Father   . Hypercholesterolemia Father   . Diabetes Father   . Aortic aneurysm Mother   . Hypercholesterolemia Mother   . Hypercholesterolemia Sister        x2  . Hyperlipidemia Brother   . Hypertension Sister   . Breast cancer Maternal Aunt   . Melanoma Maternal Uncle   . Colon cancer Neg Hx     SOCIAL HX: reviewed.    Current Outpatient Medications:  .  aspirin 81 MG EC tablet, TAKE 1 TABLET BY MOUTH DAILY, Disp: 90 tablet, Rfl: 1 .  Fe Fum-FePoly-Vit C-Vit B3 (INTEGRA) 62.5-62.5-40-3 MG CAPS, Take one tablet per day, Disp: 30 capsule, Rfl: 2 .  omeprazole (PRILOSEC) 20 MG capsule, Take 1 capsule (20 mg total) by mouth daily., Disp: 90 capsule, Rfl: 1  EXAM:  GENERAL: alert, oriented,  appears well and in no acute distress  HEENT: atraumatic, conjunttiva clear, no obvious abnormalities on inspection of external nose and ears  NECK: normal movements of the head and neck  LUNGS: on inspection no signs of respiratory distress, breathing rate appears normal, no obvious gross SOB, gasping or wheezing  CV: no obvious cyanosis  PSYCH/NEURO: pleasant and cooperative, no obvious depression or anxiety, speech and thought processing grossly intact  ASSESSMENT AND PLAN:  Discussed the following assessment and plan:   Dizziness Has had extensive w/up as outlined previously.  Has seen neurology and cardiology.  She feels her current symptoms are related to her menstrual cycle.  Request iron supplements and follow.  Desires no further w/up at this time.  Follow.    Anemia Worked up by GI recently as outlined.  Start integra.  Follow cbc and ferritin. Monitor symptoms, if persistent, will require reevaluation.      I discussed the assessment and treatment plan with the patient. The patient was provided an opportunity to ask questions and all were answered. The patient agreed with the plan and demonstrated an understanding of the instructions.   The patient was advised to call back or seek an in-person evaluation if the symptoms worsen or if the condition fails to improve as anticipated.   Einar Pheasant, MD

## 2019-03-13 ENCOUNTER — Encounter: Payer: Self-pay | Admitting: Internal Medicine

## 2019-03-13 DIAGNOSIS — D649 Anemia, unspecified: Secondary | ICD-10-CM | POA: Insufficient documentation

## 2019-03-13 NOTE — Assessment & Plan Note (Signed)
Has had extensive w/up as outlined previously.  Has seen neurology and cardiology.  She feels her current symptoms are related to her menstrual cycle.  Request iron supplements and follow.  Desires no further w/up at this time.  Follow.

## 2019-03-13 NOTE — Assessment & Plan Note (Signed)
Worked up by GI recently as outlined.  Start integra.  Follow cbc and ferritin. Monitor symptoms, if persistent, will require reevaluation.

## 2019-05-13 ENCOUNTER — Encounter: Payer: Self-pay | Admitting: Internal Medicine

## 2019-05-13 ENCOUNTER — Ambulatory Visit (INDEPENDENT_AMBULATORY_CARE_PROVIDER_SITE_OTHER): Payer: Managed Care, Other (non HMO) | Admitting: Internal Medicine

## 2019-05-13 DIAGNOSIS — Z8669 Personal history of other diseases of the nervous system and sense organs: Secondary | ICD-10-CM

## 2019-05-13 DIAGNOSIS — D6851 Activated protein C resistance: Secondary | ICD-10-CM | POA: Diagnosis not present

## 2019-05-13 DIAGNOSIS — K219 Gastro-esophageal reflux disease without esophagitis: Secondary | ICD-10-CM | POA: Diagnosis not present

## 2019-05-13 DIAGNOSIS — Z8673 Personal history of transient ischemic attack (TIA), and cerebral infarction without residual deficits: Secondary | ICD-10-CM | POA: Diagnosis not present

## 2019-05-13 DIAGNOSIS — D649 Anemia, unspecified: Secondary | ICD-10-CM

## 2019-05-13 NOTE — Progress Notes (Signed)
Patient ID: Gwendolyn Torres, female   DOB: [birth year], 49 y.o.   MRN: [id number]   Virtual Visit via video Note  This visit type was conducted due to national recommendations for restrictions regarding the COVID-19 pandemic (e.g. social distancing).  This format is felt to be most appropriate for this patient at this time.  All issues noted in this document were discussed and addressed.  No physical exam was performed (except for noted visual exam findings with Video Visits).   I connected with Gwendolyn Torres by a video enabled telemedicine application and verified that I am speaking with the correct person using two identifiers. Location patient: home Location provider: work Persons participating in the virtual visit: patient, provider  The limitations, risks, security and privacy concerns of performing an evaluation and management service by telephone and the availability of in person appointments have been discussed.   The patient expressed understanding and agreed to proceed.   Reason for visit: scheduled follow up.   HPI: She reports she is doing relatively well.  Some increased stress.  Her mother had a fall and is in the hospital.  Was diagnosed with covid.  Work is going ok.   Overall she feels she is handling things well.  Is exercising.  Seeing Dr Ouida Sills - for weight loss. On phentermine.  Also has been started on metformin.  Has lost 18lbs since November.  No chest pain or sob reported.  Bowels doing better.  Tolerating iron.  No significant constipation if takes a stool softener.  Acid reflux controlled.  Only on omeprazole q day now.  Doing well with this dose.  Periods are regular - heavy.     ROS: See pertinent positives and negatives per HPI.  Past Medical History:  Diagnosis Date  . GERD (gastroesophageal reflux disease)     Past Surgical History:  Procedure Laterality Date  . BUNIONECTOMY Right 09/25/2016   Procedure: lapidus type  modified mcbride;  Surgeon: Albertine Patricia, DPM;  Location: [facility];  Service: Podiatry;  Laterality: Right;  . CESAREAN SECTION  2004  . LOOP RECORDER INSERTION N/A 02/19/2017   Procedure: LOOP RECORDER INSERTION;  Surgeon: Isaias Cowman, MD;  Location: [facility];  Service: Cardiovascular;  Laterality: N/A;  . OSTECTOMY Right 09/25/2016   Procedure: OSTECTOMY RT  1SR,2ND 3RD  metatarsal;  Surgeon: Albertine Patricia, DPM;  Location: [facility];  Service: Podiatry;  Laterality: Right;  LMA LOCAL  . TEE WITHOUT CARDIOVERSION N/A 10/28/2016   Procedure: TRANSESOPHAGEAL ECHOCARDIOGRAM (TEE);  Surgeon: Teodoro Spray, MD;  Location: [facility];  Service: Cardiovascular;  Laterality: N/A;    Family History  Problem Relation Age of Onset  . Aortic aneurysm Father        also has popliteal aneurysm  . Hypertension Father   . Hypercholesterolemia Father   . Diabetes Father   . Aortic aneurysm Mother   . Hypercholesterolemia Mother   . Hypercholesterolemia Sister        x2  . Hyperlipidemia Brother   . Hypertension Sister   . Breast cancer Maternal Aunt   . Melanoma Maternal Uncle   . Colon cancer Neg Hx     SOCIAL HX: reviewed.    Current Outpatient Medications:  .  aspirin 81 MG EC tablet, TAKE 1 TABLET BY MOUTH DAILY, Disp: 90 tablet, Rfl: 1 .  Fe Fum-FePoly-Vit C-Vit B3 (INTEGRA) 62.5-62.5-40-3 MG CAPS, Take one tablet per day, Disp: 30 capsule, Rfl: 2 .  omeprazole (PRILOSEC)  20 MG capsule, Take 1 capsule (20 mg total) by mouth daily., Disp: 90 capsule, Rfl: 1  EXAM:  GENERAL: alert, oriented, appears well and in no acute distress  HEENT: atraumatic, conjunttiva clear, no obvious abnormalities on inspection of external nose and ears  NECK: normal movements of the head and neck  LUNGS: on inspection no signs of respiratory distress, breathing rate appears normal, no obvious gross SOB, gasping or wheezing  CV: no obvious cyanosis  PSYCH/NEURO: pleasant and cooperative, no  obvious depression or anxiety, speech and thought processing grossly intact  ASSESSMENT AND PLAN:  Discussed the following assessment and plan:  Anemia Worked up by GI.  On integra.  Tolerating as long as takes a stool softener.  Follow cbc and ferritin.    Factor V Leiden mutation ([facility]) Heterozygous.  Evaluated by hematology.    Gastroesophageal reflux disease Controlled on prilosec daily.  Follow.    History of CVA (cerebrovascular accident) Taking aspirin daily.  Stable.    History of seizure disorder Negative EEG.  Off keppra.  Follow.     Orders Placed This Encounter  Procedures  . Lipid panel    Standing Status:   Future    Standing Expiration Date:   05/12/2020  . Comprehensive metabolic panel    Standing Status:   Future    Standing Expiration Date:   05/12/2020  . CBC with Differential/Platelet    Standing Status:   Future    Standing Expiration Date:   05/12/2020  . Ferritin    Standing Status:   Future    Standing Expiration Date:   05/12/2020  . TSH    Standing Status:   Future    Standing Expiration Date:   05/14/2020     I discussed the assessment and treatment plan with the patient. The patient was provided an opportunity to ask questions and all were answered. The patient agreed with the plan and demonstrated an understanding of the instructions.   The patient was advised to call back or seek an in-person evaluation if the symptoms worsen or if the condition fails to improve as anticipated.   Einar Pheasant, MD

## 2019-05-15 ENCOUNTER — Encounter: Payer: Self-pay | Admitting: Internal Medicine

## 2019-05-15 NOTE — Assessment & Plan Note (Signed)
Controlled on prilosec daily.  Follow.   

## 2019-05-15 NOTE — Assessment & Plan Note (Signed)
Taking aspirin daily.  Stable.

## 2019-05-15 NOTE — Assessment & Plan Note (Signed)
Negative EEG.  Off keppra.  Follow.

## 2019-05-15 NOTE — Assessment & Plan Note (Signed)
Worked up by GI.  On integra.  Tolerating as long as takes a stool softener.  Follow cbc and ferritin.

## 2019-05-15 NOTE — Assessment & Plan Note (Signed)
Heterozygous.  Evaluated by hematology.  

## 2019-05-27 ENCOUNTER — Encounter: Payer: Self-pay | Admitting: Internal Medicine

## 2019-06-07 ENCOUNTER — Other Ambulatory Visit: Payer: Self-pay | Admitting: Internal Medicine

## 2019-06-09 ENCOUNTER — Other Ambulatory Visit: Payer: Self-pay

## 2019-06-09 ENCOUNTER — Other Ambulatory Visit (INDEPENDENT_AMBULATORY_CARE_PROVIDER_SITE_OTHER): Payer: Managed Care, Other (non HMO)

## 2019-06-09 DIAGNOSIS — D649 Anemia, unspecified: Secondary | ICD-10-CM

## 2019-06-09 LAB — CBC WITH DIFFERENTIAL/PLATELET
Basophils Absolute: 0.1 10*3/uL (ref 0.0–0.1)
Basophils Relative: 1.2 % (ref 0.0–3.0)
Eosinophils Absolute: 0.1 10*3/uL (ref 0.0–0.7)
Eosinophils Relative: 1.7 % (ref 0.0–5.0)
HCT: 40 % (ref 36.0–46.0)
Hemoglobin: 13 g/dL (ref 12.0–15.0)
Lymphocytes Relative: 35.8 % (ref 12.0–46.0)
Lymphs Abs: 2.4 10*3/uL (ref 0.7–4.0)
MCHC: 32.5 g/dL (ref 30.0–36.0)
MCV: 84.5 fl (ref 78.0–100.0)
Monocytes Absolute: 0.6 10*3/uL (ref 0.1–1.0)
Monocytes Relative: 8.5 % (ref 3.0–12.0)
Neutro Abs: 3.5 10*3/uL (ref 1.4–7.7)
Neutrophils Relative %: 52.8 % (ref 43.0–77.0)
Platelets: 342 10*3/uL (ref 150.0–400.0)
RBC: 4.73 Mil/uL (ref 3.87–5.11)
RDW: 14.9 % (ref 11.5–15.5)
WBC: 6.7 10*3/uL (ref 4.0–10.5)

## 2019-06-09 LAB — COMPREHENSIVE METABOLIC PANEL
ALT: 17 U/L (ref 0–35)
AST: 17 U/L (ref 0–37)
Albumin: 4.1 g/dL (ref 3.5–5.2)
Alkaline Phosphatase: 82 U/L (ref 39–117)
BUN: 16 mg/dL (ref 6–23)
CO2: 25 mEq/L (ref 19–32)
Calcium: 9.2 mg/dL (ref 8.4–10.5)
Chloride: 103 mEq/L (ref 96–112)
Creatinine, Ser: 0.8 mg/dL (ref 0.40–1.20)
GFR: 76.63 mL/min (ref >60.00)
Glucose, Bld: 100 mg/dL — ABNORMAL HIGH (ref 70–99)
Potassium: 3.9 mEq/L (ref 3.5–5.1)
Sodium: 137 mEq/L (ref 135–145)
Total Bilirubin: 0.4 mg/dL (ref 0.2–1.2)
Total Protein: 7.6 g/dL (ref 6.0–8.3)

## 2019-06-09 LAB — LIPID PANEL
Cholesterol: 160 mg/dL (ref 0–200)
HDL: 43.4 mg/dL (ref >39.00)
LDL Cholesterol: 97 mg/dL (ref 0–99)
NonHDL: 116.11
Total CHOL/HDL Ratio: 4
Triglycerides: 95 mg/dL (ref 0.0–149.0)
VLDL: 19 mg/dL (ref 0.0–40.0)

## 2019-06-09 LAB — FERRITIN: Ferritin: 25.6 ng/mL (ref 10.0–291.0)

## 2019-06-09 LAB — TSH: TSH: 1.56 u[IU]/mL (ref 0.35–4.50)

## 2019-06-13 ENCOUNTER — Encounter: Payer: Self-pay | Admitting: Internal Medicine

## 2019-09-05 ENCOUNTER — Other Ambulatory Visit: Payer: Self-pay | Admitting: Internal Medicine

## 2019-11-22 ENCOUNTER — Encounter: Payer: Self-pay | Admitting: Internal Medicine

## 2019-11-22 ENCOUNTER — Other Ambulatory Visit: Payer: Self-pay

## 2019-11-22 ENCOUNTER — Ambulatory Visit: Payer: Managed Care, Other (non HMO) | Admitting: Internal Medicine

## 2019-11-22 DIAGNOSIS — D649 Anemia, unspecified: Secondary | ICD-10-CM

## 2019-11-22 DIAGNOSIS — K219 Gastro-esophageal reflux disease without esophagitis: Secondary | ICD-10-CM

## 2019-11-22 DIAGNOSIS — E611 Iron deficiency: Secondary | ICD-10-CM | POA: Diagnosis not present

## 2019-11-22 DIAGNOSIS — Z8669 Personal history of other diseases of the nervous system and sense organs: Secondary | ICD-10-CM

## 2019-11-22 DIAGNOSIS — D6851 Activated protein C resistance: Secondary | ICD-10-CM

## 2019-11-22 DIAGNOSIS — F418 Other specified anxiety disorders: Secondary | ICD-10-CM

## 2019-11-22 DIAGNOSIS — Z8673 Personal history of transient ischemic attack (TIA), and cerebral infarction without residual deficits: Secondary | ICD-10-CM

## 2019-11-22 DIAGNOSIS — Z1231 Encounter for screening mammogram for malignant neoplasm of breast: Secondary | ICD-10-CM

## 2019-11-22 LAB — COMPREHENSIVE METABOLIC PANEL
ALT: 18 U/L (ref 0–35)
AST: 16 U/L (ref 0–37)
Albumin: 3.8 g/dL (ref 3.5–5.2)
Alkaline Phosphatase: 72 U/L (ref 39–117)
BUN: 16 mg/dL (ref 6–23)
CO2: 28 mEq/L (ref 19–32)
Calcium: 9 mg/dL (ref 8.4–10.5)
Chloride: 102 mEq/L (ref 96–112)
Creatinine, Ser: 0.87 mg/dL (ref 0.40–1.20)
GFR: 69.42 mL/min (ref >60.00)
Glucose, Bld: 94 mg/dL (ref 70–99)
Potassium: 4.3 mEq/L (ref 3.5–5.1)
Sodium: 136 mEq/L (ref 135–145)
Total Bilirubin: 0.4 mg/dL (ref 0.2–1.2)
Total Protein: 6.4 g/dL (ref 6.0–8.3)

## 2019-11-22 LAB — CBC WITH DIFFERENTIAL/PLATELET
Basophils Absolute: 0.1 10*3/uL (ref 0.0–0.1)
Basophils Relative: 1.2 % (ref 0.0–3.0)
Eosinophils Absolute: 0.1 10*3/uL (ref 0.0–0.7)
Eosinophils Relative: 1.4 % (ref 0.0–5.0)
HCT: 36.6 % (ref 36.0–46.0)
Hemoglobin: 12.4 g/dL (ref 12.0–15.0)
Lymphocytes Relative: 20.6 % (ref 12.0–46.0)
Lymphs Abs: 1.6 10*3/uL (ref 0.7–4.0)
MCHC: 33.9 g/dL (ref 30.0–36.0)
MCV: 84.9 fl (ref 78.0–100.0)
Monocytes Absolute: 0.6 10*3/uL (ref 0.1–1.0)
Monocytes Relative: 8 % (ref 3.0–12.0)
Neutro Abs: 5.5 10*3/uL (ref 1.4–7.7)
Neutrophils Relative %: 68.8 % (ref 43.0–77.0)
Platelets: 288 10*3/uL (ref 150.0–400.0)
RBC: 4.31 Mil/uL (ref 3.87–5.11)
RDW: 13.9 % (ref 11.5–15.5)
WBC: 8 10*3/uL (ref 4.0–10.5)

## 2019-11-22 LAB — IBC + FERRITIN
Ferritin: 13.3 ng/mL (ref 10.0–291.0)
Iron: 89 ug/dL (ref 42–145)
Saturation Ratios: 21.3 % (ref 20.0–50.0)
Transferrin: 299 mg/dL (ref 212.0–360.0)

## 2019-11-22 NOTE — Progress Notes (Addendum)
Patient ID: MARJORIE DEPREY, female   DOB: 1971/02/18, 49 y.o.   MRN: [id number]   Subjective:    Patient ID: [name], female    DOB: [birth year], 49 y.o.   MRN: [id number]  HPI This visit occurred during the SARS-CoV-2 public health emergency.  Safety protocols were in place, including screening questions prior to the visit, additional usage of staff PPE, and extensive cleaning of exam room while observing appropriate contact time as indicated for disinfecting solutions.  Patient here for a scheduled follow up.  Has been working with a Clinical research associate.  Has lost weight.  Is exercising regularly.  Has adjusted her diet.  Periods not as heavy.  Not taking iron now.  Has been off for two months.  Does tolerate the integra better than other forms of iron, but does still have some constipation.  Took a dulcolax over the weekend.  Has had one episode about a month ago, of some fluttering of her heart.  Overall is much better.  First episode in a long time. When gets constipated,will notice chest wall pain.  Is reproducible on exam.  No chest pain or sob with increased activity or exertion.  Only on one prilosec per day.  Controlling acid reflux.  Discussed further evaluation of pain.  Will follow.  No pain currently.   Past Medical History:  Diagnosis Date  . GERD (gastroesophageal reflux disease)    Past Surgical History:  Procedure Laterality Date  . BUNIONECTOMY Right 09/25/2016   Procedure: lapidus type  modified mcbride;  Surgeon: Albertine Patricia, DPM;  Location: [facility];  Service: Podiatry;  Laterality: Right;  . CESAREAN SECTION  2004  . LOOP RECORDER INSERTION N/A 02/19/2017   Procedure: LOOP RECORDER INSERTION;  Surgeon: Isaias Cowman, MD;  Location: [facility];  Service: Cardiovascular;  Laterality: N/A;  . OSTECTOMY Right 09/25/2016   Procedure: OSTECTOMY RT  1SR,2ND 3RD  metatarsal;  Surgeon: Albertine Patricia, DPM;  Location: [facility];  Service: Podiatry;   Laterality: Right;  LMA LOCAL  . TEE WITHOUT CARDIOVERSION N/A 10/28/2016   Procedure: TRANSESOPHAGEAL ECHOCARDIOGRAM (TEE);  Surgeon: Teodoro Spray, MD;  Location: [facility];  Service: Cardiovascular;  Laterality: N/A;   Family History  Problem Relation Age of Onset  . Aortic aneurysm Father        also has popliteal aneurysm  . Hypertension Father   . Hypercholesterolemia Father   . Diabetes Father   . Aortic aneurysm Mother   . Hypercholesterolemia Mother   . Hypercholesterolemia Sister        x2  . Hyperlipidemia Brother   . Hypertension Sister   . Breast cancer Maternal Aunt   . Melanoma Maternal Uncle   . Colon cancer Neg Hx    Social History   Socioeconomic History  . Marital status: Married    Spouse name: Not on file  . Number of children: 1  . Years of education: Not on file  . Highest education level: Not on file  Occupational History  . Occupation: transcriptionist  Tobacco Use  . Smoking status: Never Smoker  . Smokeless tobacco: Never Used  Vaping Use  . Vaping Use: Never used  Substance and Sexual Activity  . Alcohol use: No    Alcohol/week: 0.0 standard drinks  . Drug use: No  . Sexual activity: Yes    Birth control/protection: None  Other Topics Concern  . Not on file  Social History Narrative  . Not on file  Social Determinants of Health   Financial Resource Strain:   . Difficulty of Paying Living Expenses:   Food Insecurity:   . Worried About Charity fundraiser in the Last Year:   . Arboriculturist in the Last Year:   Transportation Needs:   . Film/video editor (Medical):   Marland Kitchen Lack of Transportation (Non-Medical):   Physical Activity:   . Days of Exercise per Week:   . Minutes of Exercise per Session:   Stress:   . Feeling of Stress :   Social Connections:   . Frequency of Communication with Friends and Family:   . Frequency of Social Gatherings with Friends and Family:   . Attends Religious Services:   . Active Member of  Clubs or Organizations:   . Attends Archivist Meetings:   Marland Kitchen Marital Status:     Outpatient Encounter Medications as of 11/22/2019  Medication Sig  . aspirin 81 MG EC tablet TAKE 1 TABLET BY MOUTH DAILY  . Fe Fum-FePoly-Vit C-Vit B3 (INTEGRA) 62.5-62.5-40-3 MG CAPS TAKE 1 CAPSULE BY MOUTH EVERY DAY  . omeprazole (PRILOSEC) 20 MG capsule Take 1 capsule (20 mg total) by mouth daily.   No facility-administered encounter medications on file as of 11/22/2019.    Review of Systems  Constitutional: Negative for appetite change and unexpected weight change.  HENT: Negative for congestion and sinus pressure.   Respiratory: Negative for cough, chest tightness and shortness of breath.   Cardiovascular: Negative for chest pain and leg swelling.       Previous episode of palpitations as outlined.  None now.   Gastrointestinal: Negative for abdominal pain, diarrhea, nausea and vomiting.  Genitourinary: Negative for difficulty urinating and dysuria.  Musculoskeletal: Negative for joint swelling and myalgias.  Skin: Negative for color change and rash.  Neurological: Negative for dizziness, light-headedness and headaches.  Psychiatric/Behavioral: Negative for agitation and dysphoric mood.       Objective:    Physical Exam Vitals reviewed.  Constitutional:      General: She is not in acute distress.    Appearance: Normal appearance.  HENT:     Head: Normocephalic and atraumatic.     Right Ear: External ear normal.     Left Ear: External ear normal.  Eyes:     General: No scleral icterus.       Right eye: No discharge.        Left eye: No discharge.     Conjunctiva/sclera: Conjunctivae normal.  Neck:     Thyroid: No thyromegaly.  Cardiovascular:     Rate and Rhythm: Normal rate and regular rhythm.  Pulmonary:     Effort: No respiratory distress.     Breath sounds: Normal breath sounds. No wheezing.  Abdominal:     General: Bowel sounds are normal.     Palpations: Abdomen is  soft.     Tenderness: There is no abdominal tenderness.  Musculoskeletal:        General: No swelling or tenderness.     Cervical back: Neck supple. No tenderness.  Lymphadenopathy:     Cervical: No cervical adenopathy.  Skin:    Findings: No erythema or rash.  Neurological:     Mental Status: She is alert.  Psychiatric:        Mood and Affect: Mood normal.        Behavior: Behavior normal.     BP 124/78   Pulse 87   Temp 97.6 F (36.4 C)  Resp 16   Ht 5\' 7"  (1.702 m)   Wt 254 lb (115.2 kg)   SpO2 99%   BMI 39.78 kg/m  Wt Readings from Last 3 Encounters:  11/22/19 254 lb (115.2 kg)  05/06/18 273 lb 3.2 oz (123.9 kg)  11/02/17 266 lb 3.2 oz (120.7 kg)     Lab Results  Component Value Date   WBC 8.0 11/22/2019   HGB 12.4 11/22/2019   HCT 36.6 11/22/2019   PLT 288.0 11/22/2019   GLUCOSE 94 11/22/2019   CHOL 160 06/09/2019   TRIG 95.0 06/09/2019   HDL 43.40 06/09/2019   LDLCALC 97 06/09/2019   ALT 18 11/22/2019   AST 16 11/22/2019   NA 136 11/22/2019   K 4.3 11/22/2019   CL 102 11/22/2019   CREATININE 0.87 11/22/2019   BUN 16 11/22/2019   CO2 28 11/22/2019   TSH 1.56 06/09/2019   INR 1.04 10/16/2016   HGBA1C 5.4 10/17/2016    MM 3D SCREEN BREAST BILATERAL  Result Date: 01/18/2019 CLINICAL DATA:  Screening. EXAM: DIGITAL SCREENING BILATERAL MAMMOGRAM WITH TOMO AND CAD COMPARISON:  Previous exam(s). ACR Breast Density Category b: There are scattered areas of fibroglandular density. FINDINGS: There are no findings suspicious for malignancy. Images were processed with CAD. IMPRESSION: No mammographic evidence of malignancy. A result letter of this screening mammogram will be mailed directly to the patient. RECOMMENDATION: Screening mammogram in one year. (Code:SM-B-01Y) BI-RADS CATEGORY  1: Negative. Electronically Signed   By: Franki Cabot M.D.   On: 01/18/2019 12:00       Assessment & Plan:   Problem List Items Addressed This Visit    Anemia     Previously on integra.  Off now.  Check cbc and iron studies.  Refer to GI for evaluation - colonoscopy.        Relevant Orders   CBC with Differential/Platelet (Completed)   Comprehensive metabolic panel (Completed)   IBC + Ferritin (Completed)   Factor V Leiden mutation ([place])    Heterozygous.  Evaluated by hematology.        Gastroesophageal reflux disease    Controlled on prilosec daily.  Follow.        History of CVA (cerebrovascular accident)    Taking aspirin daily.  Follow.        History of seizure disorder    Negative EEG.  Off keppra.  Doing well.  Follow.        Iron deficiency    Off integra.  Check cbc and iron studies.        Relevant Orders   Ambulatory referral to Gastroenterology   Situational anxiety    Doing well on no medication.  Follow.        Other Visit Diagnoses    Visit for screening mammogram       Relevant Orders   MM 3D SCREEN BREAST BILATERAL       Einar Pheasant, MD

## 2019-11-23 ENCOUNTER — Other Ambulatory Visit: Payer: Self-pay | Admitting: Internal Medicine

## 2019-11-23 DIAGNOSIS — D649 Anemia, unspecified: Secondary | ICD-10-CM

## 2019-11-23 NOTE — Progress Notes (Signed)
Orders placed for f/u labs.  

## 2019-11-27 ENCOUNTER — Encounter: Payer: Self-pay | Admitting: Internal Medicine

## 2019-11-27 NOTE — Assessment & Plan Note (Signed)
Heterozygous.  Evaluated by hematology.  

## 2019-11-27 NOTE — Assessment & Plan Note (Signed)
Taking aspirin daily.  Follow.

## 2019-11-27 NOTE — Assessment & Plan Note (Signed)
Off integra.  Check cbc and iron studies.

## 2019-11-27 NOTE — Assessment & Plan Note (Signed)
Negative EEG.  Off keppra.  Doing well.  Follow.

## 2019-11-27 NOTE — Assessment & Plan Note (Signed)
Controlled on prilosec daily.  Follow.

## 2019-11-27 NOTE — Addendum Note (Signed)
Addended by: Alisa Graff on: 11/27/2019 06:54 PM   Modules accepted: Orders

## 2019-11-27 NOTE — Assessment & Plan Note (Addendum)
Previously on integra.  Off now.  Check cbc and iron studies.  Refer to GI for evaluation - colonoscopy.

## 2019-11-27 NOTE — Assessment & Plan Note (Signed)
Doing well on no medication.  Follow.

## 2020-01-19 ENCOUNTER — Other Ambulatory Visit: Payer: Self-pay

## 2020-01-19 ENCOUNTER — Ambulatory Visit
Admission: RE | Admit: 2020-01-19 | Discharge: 2020-01-19 | Disposition: A | Discharge status: Home / Self Care | Payer: Managed Care, Other (non HMO) | Source: Ambulatory Visit | Attending: Internal Medicine | Admitting: Internal Medicine

## 2020-01-19 DIAGNOSIS — Z1231 Encounter for screening mammogram for malignant neoplasm of breast: Secondary | ICD-10-CM | POA: Diagnosis not present

## 2020-01-21 ENCOUNTER — Other Ambulatory Visit: Payer: Self-pay | Admitting: Internal Medicine

## 2020-01-21 DIAGNOSIS — R928 Other abnormal and inconclusive findings on diagnostic imaging of breast: Secondary | ICD-10-CM

## 2020-01-21 NOTE — Progress Notes (Signed)
Order placed for f/u right breast mammogram and ultrasound  

## 2020-01-23 ENCOUNTER — Other Ambulatory Visit: Payer: Self-pay | Admitting: Internal Medicine

## 2020-01-23 DIAGNOSIS — R928 Other abnormal and inconclusive findings on diagnostic imaging of breast: Secondary | ICD-10-CM

## 2020-01-23 DIAGNOSIS — R921 Mammographic calcification found on diagnostic imaging of breast: Secondary | ICD-10-CM

## 2020-01-26 ENCOUNTER — Ambulatory Visit
Admission: RE | Admit: 2020-01-26 | Discharge: 2020-01-26 | Disposition: A | Discharge status: Home / Self Care | Payer: Managed Care, Other (non HMO) | Source: Ambulatory Visit | Attending: Internal Medicine | Admitting: Internal Medicine

## 2020-01-26 ENCOUNTER — Other Ambulatory Visit: Payer: Self-pay

## 2020-01-26 DIAGNOSIS — R928 Other abnormal and inconclusive findings on diagnostic imaging of breast: Secondary | ICD-10-CM | POA: Diagnosis not present

## 2020-01-26 DIAGNOSIS — R921 Mammographic calcification found on diagnostic imaging of breast: Secondary | ICD-10-CM

## 2020-01-27 ENCOUNTER — Other Ambulatory Visit: Payer: Self-pay | Admitting: Internal Medicine

## 2020-01-27 NOTE — Progress Notes (Signed)
Opened in error

## 2020-01-31 ENCOUNTER — Telehealth: Payer: Self-pay | Admitting: Internal Medicine

## 2020-01-31 DIAGNOSIS — R928 Other abnormal and inconclusive findings on diagnostic imaging of breast: Secondary | ICD-10-CM

## 2020-01-31 NOTE — Telephone Encounter (Signed)
Did you call her?

## 2020-01-31 NOTE — Telephone Encounter (Signed)
Pt called and said that she is returning a phone call from Dr. Nicki Reaper about her Mammogram

## 2020-02-01 NOTE — Addendum Note (Signed)
Addended by: Alisa Graff on: 02/01/2020 09:29 PM   Modules accepted: Orders

## 2020-02-01 NOTE — Telephone Encounter (Signed)
Discussed her mammogram result.  Discussed options including biopsy through radiology and referral to surgery. She prefers referral and request to see Dr Bary Castilla.  Order placed for referral.

## 2020-02-23 ENCOUNTER — Other Ambulatory Visit (INDEPENDENT_AMBULATORY_CARE_PROVIDER_SITE_OTHER): Payer: Managed Care, Other (non HMO)

## 2020-02-23 ENCOUNTER — Other Ambulatory Visit: Payer: Self-pay

## 2020-02-23 DIAGNOSIS — D649 Anemia, unspecified: Secondary | ICD-10-CM

## 2020-02-23 LAB — CBC WITH DIFFERENTIAL/PLATELET
Basophils Absolute: 0.1 10*3/uL (ref 0.0–0.1)
Basophils Relative: 0.7 % (ref 0.0–3.0)
Eosinophils Absolute: 0.1 10*3/uL (ref 0.0–0.7)
Eosinophils Relative: 1.4 % (ref 0.0–5.0)
HCT: 36.8 % (ref 36.0–46.0)
Hemoglobin: 12.4 g/dL (ref 12.0–15.0)
Lymphocytes Relative: 24.6 % (ref 12.0–46.0)
Lymphs Abs: 1.7 10*3/uL (ref 0.7–4.0)
MCHC: 33.7 g/dL (ref 30.0–36.0)
MCV: 83.3 fl (ref 78.0–100.0)
Monocytes Absolute: 0.6 10*3/uL (ref 0.1–1.0)
Monocytes Relative: 8.3 % (ref 3.0–12.0)
Neutro Abs: 4.6 10*3/uL (ref 1.4–7.7)
Neutrophils Relative %: 65 % (ref 43.0–77.0)
Platelets: 291 10*3/uL (ref 150.0–400.0)
RBC: 4.42 Mil/uL (ref 3.87–5.11)
RDW: 14.6 % (ref 11.5–15.5)
WBC: 7 10*3/uL (ref 4.0–10.5)

## 2020-02-23 LAB — IBC + FERRITIN
Ferritin: 19.4 ng/mL (ref 10.0–291.0)
Iron: 160 ug/dL — ABNORMAL HIGH (ref 42–145)
Saturation Ratios: 37.5 % (ref 20.0–50.0)
Transferrin: 305 mg/dL (ref 212.0–360.0)

## 2020-02-27 ENCOUNTER — Encounter: Payer: Self-pay | Admitting: Internal Medicine

## 2020-02-27 ENCOUNTER — Ambulatory Visit (INDEPENDENT_AMBULATORY_CARE_PROVIDER_SITE_OTHER): Payer: Managed Care, Other (non HMO) | Admitting: Internal Medicine

## 2020-02-27 ENCOUNTER — Other Ambulatory Visit: Payer: Self-pay

## 2020-02-27 VITALS — BP 124/76 | HR 86 | Temp 98.3°F | Ht 67.13 in | Wt 261.0 lb

## 2020-02-27 DIAGNOSIS — D649 Anemia, unspecified: Secondary | ICD-10-CM | POA: Diagnosis not present

## 2020-02-27 DIAGNOSIS — Z8673 Personal history of transient ischemic attack (TIA), and cerebral infarction without residual deficits: Secondary | ICD-10-CM

## 2020-02-27 DIAGNOSIS — D6851 Activated protein C resistance: Secondary | ICD-10-CM

## 2020-02-27 DIAGNOSIS — K219 Gastro-esophageal reflux disease without esophagitis: Secondary | ICD-10-CM

## 2020-02-27 DIAGNOSIS — Z0001 Encounter for general adult medical examination with abnormal findings: Secondary | ICD-10-CM

## 2020-02-27 DIAGNOSIS — E611 Iron deficiency: Secondary | ICD-10-CM

## 2020-02-27 DIAGNOSIS — Z0000 Encounter for general adult medical examination without abnormal findings: Secondary | ICD-10-CM | POA: Diagnosis not present

## 2020-02-27 DIAGNOSIS — F418 Other specified anxiety disorders: Secondary | ICD-10-CM

## 2020-02-27 NOTE — Progress Notes (Signed)
Patient ID: CONNA TERADA, female   DOB: August 18, 1970, 49 y.o.   MRN: [id number]   Subjective:    Patient ID: [name], female    DOB: 1971/04/29, 49 y.o.   MRN: [id number]  HPI This visit occurred during the SARS-CoV-2 public health emergency.  Safety protocols were in place, including screening questions prior to the visit, additional usage of staff PPE, and extensive cleaning of exam room while observing appropriate contact time as indicated for disinfecting solutions.  Patient here for her physical exam.  She reports she has been doing relatively well.  Working.  Handling stress ok.  She has a history of migraines.  With her migraines, will usually notice squiggly lines - periphery.  Recently has had a couple of episodes where her peripheral vision was altered and loss of peripheral vision.  Also noticed tingling - one side.  Most recent episode, felt like she was going to pass out.  No significant headache.  No chest pain or sob reported.  No abdominal pain.  Bowels stable.  Has a previous history of TIA.  Has been worked up by cardiology and neurology.  No residual neurological changes.    Past Medical History:  Diagnosis Date  . GERD (gastroesophageal reflux disease)    Past Surgical History:  Procedure Laterality Date  . BUNIONECTOMY Right 09/25/2016   Procedure: lapidus type  modified mcbride;  Surgeon: Albertine Patricia, DPM;  Location: [facility];  Service: Podiatry;  Laterality: Right;  . CESAREAN SECTION  2004  . LOOP RECORDER INSERTION N/A 02/19/2017   Procedure: LOOP RECORDER INSERTION;  Surgeon: Isaias Cowman, MD;  Location: [facility];  Service: Cardiovascular;  Laterality: N/A;  . OSTECTOMY Right 09/25/2016   Procedure: OSTECTOMY RT  1SR,2ND 3RD  metatarsal;  Surgeon: Albertine Patricia, DPM;  Location: [facility];  Service: Podiatry;  Laterality: Right;  LMA LOCAL  . TEE WITHOUT CARDIOVERSION N/A 10/28/2016   Procedure: TRANSESOPHAGEAL  ECHOCARDIOGRAM (TEE);  Surgeon: Teodoro Spray, MD;  Location: [facility];  Service: Cardiovascular;  Laterality: N/A;   Family History  Problem Relation Age of Onset  . Aortic aneurysm Father        also has popliteal aneurysm  . Hypertension Father   . Hypercholesterolemia Father   . Diabetes Father   . Aortic aneurysm Mother   . Hypercholesterolemia Mother   . Hypercholesterolemia Sister        x2  . Hyperlipidemia Brother   . Hypertension Sister   . Breast cancer Maternal Aunt   . Melanoma Maternal Uncle   . Colon cancer Neg Hx    Social History   Socioeconomic History  . Marital status: Married    Spouse name: Not on file  . Number of children: 1  . Years of education: Not on file  . Highest education level: Not on file  Occupational History  . Occupation: transcriptionist  Tobacco Use  . Smoking status: Never Smoker  . Smokeless tobacco: Never Used  Vaping Use  . Vaping Use: Never used  Substance and Sexual Activity  . Alcohol use: No    Alcohol/week: 0.0 standard drinks  . Drug use: No  . Sexual activity: Yes    Birth control/protection: None  Other Topics Concern  . Not on file  Social History Narrative  . Not on file   Social Determinants of Health   Financial Resource Strain:   . Difficulty of Paying Living Expenses: Not on file  Food Insecurity:   .  Worried About Charity fundraiser in the Last Year: Not on file  . Ran Out of Food in the Last Year: Not on file  Transportation Needs:   . Lack of Transportation (Medical): Not on file  . Lack of Transportation (Non-Medical): Not on file  Physical Activity:   . Days of Exercise per Week: Not on file  . Minutes of Exercise per Session: Not on file  Stress:   . Feeling of Stress : Not on file  Social Connections:   . Frequency of Communication with Friends and Family: Not on file  . Frequency of Social Gatherings with Friends and Family: Not on file  . Attends Religious Services: Not on file  .  Active Member of Clubs or Organizations: Not on file  . Attends Archivist Meetings: Not on file  . Marital Status: Not on file    Outpatient Encounter Medications as of 02/27/2020  Medication Sig  . aspirin 81 MG EC tablet TAKE 1 TABLET BY MOUTH DAILY  . omeprazole (PRILOSEC) 20 MG capsule Take 1 capsule (20 mg total) by mouth daily.  . [DISCONTINUED] Fe Fum-FePoly-Vit C-Vit B3 (INTEGRA) 62.5-62.5-40-3 MG CAPS TAKE 1 CAPSULE BY MOUTH EVERY DAY   No facility-administered encounter medications on file as of 02/27/2020.    Review of Systems  Constitutional: Negative for appetite change and unexpected weight change.  HENT: Negative for congestion, sinus pressure and sore throat.   Eyes: Negative for pain and visual disturbance.  Respiratory: Negative for cough, chest tightness and shortness of breath.   Cardiovascular: Negative for chest pain, palpitations and leg swelling.  Gastrointestinal: Negative for abdominal pain, diarrhea, nausea and vomiting.  Genitourinary: Negative for difficulty urinating and dysuria.  Musculoskeletal: Negative for joint swelling and myalgias.  Skin: Negative for color change and rash.  Neurological: Negative for dizziness, light-headedness and headaches.  Hematological: Negative for adenopathy. Does not bruise/bleed easily.  Psychiatric/Behavioral: Negative for decreased concentration and dysphoric mood.       Objective:    Physical Exam Vitals reviewed.  Constitutional:      General: She is not in acute distress.    Appearance: Normal appearance. She is well-developed.  HENT:     Head: Normocephalic and atraumatic.     Right Ear: External ear normal.     Left Ear: External ear normal.  Eyes:     General: No scleral icterus.       Right eye: No discharge.        Left eye: No discharge.     Conjunctiva/sclera: Conjunctivae normal.  Neck:     Thyroid: No thyromegaly.  Cardiovascular:     Rate and Rhythm: Normal rate and regular  rhythm.  Pulmonary:     Effort: No tachypnea, accessory muscle usage or respiratory distress.     Breath sounds: Normal breath sounds. No decreased breath sounds or wheezing.  Chest:     Breasts:        Right: No inverted nipple, mass, nipple discharge or tenderness (no axillary adenopathy).        Left: No inverted nipple, mass, nipple discharge or tenderness (no axilarry adenopathy).  Abdominal:     General: Bowel sounds are normal.     Palpations: Abdomen is soft.     Tenderness: There is no abdominal tenderness.  Musculoskeletal:        General: No swelling or tenderness.     Cervical back: Neck supple. No tenderness.  Lymphadenopathy:     Cervical: No  cervical adenopathy.  Skin:    Findings: No erythema or rash.  Neurological:     Mental Status: She is alert and oriented to person, place, and time.  Psychiatric:        Mood and Affect: Mood normal.        Behavior: Behavior normal.     BP 124/76 (BP Location: Left Arm, Patient Position: Sitting, Cuff Size: Large)   Pulse 86   Temp 98.3 F (36.8 C) (Oral)   Ht 5' 7.13" (1.705 m)   Wt 261 lb (118.4 kg)   SpO2 97%   BMI 40.73 kg/m  Wt Readings from Last 3 Encounters:  02/27/20 261 lb (118.4 kg)  11/22/19 254 lb (115.2 kg)  05/06/18 273 lb 3.2 oz (123.9 kg)     Lab Results  Component Value Date   WBC 7.0 02/23/2020   HGB 12.4 02/23/2020   HCT 36.8 02/23/2020   PLT 291.0 02/23/2020   GLUCOSE 94 11/22/2019   CHOL 160 06/09/2019   TRIG 95.0 06/09/2019   HDL 43.40 06/09/2019   LDLCALC 97 06/09/2019   ALT 18 11/22/2019   AST 16 11/22/2019   NA 136 11/22/2019   K 4.3 11/22/2019   CL 102 11/22/2019   CREATININE 0.87 11/22/2019   BUN 16 11/22/2019   CO2 28 11/22/2019   TSH 1.56 06/09/2019   INR 1.04 10/16/2016   HGBA1C 5.4 10/17/2016    MM Digital Diagnostic Unilat R  Result Date: 01/26/2020 CLINICAL DATA:  The patient was called back for right breast calcifications EXAM: DIGITAL DIAGNOSTIC RIGHT  MAMMOGRAM WITH TOMO COMPARISON:  Previous exam(s). ACR Breast Density Category b: There are scattered areas of fibroglandular density. FINDINGS: The new group of calcifications in the lateral inferior right breast are pleomorphic and span 6 mm. No other suspicious findings. IMPRESSION: New indeterminate pleomorphic right breast calcifications. RECOMMENDATION: Stereotactic biopsy of the new right breast calcifications. I have discussed the findings and recommendations with the patient. If applicable, a reminder letter will be sent to the patient regarding the next appointment. BI-RADS CATEGORY  4: Suspicious. Electronically Signed   By: Dorise Bullion III M.D   On: 01/26/2020 14:05       Assessment & Plan:   Problem List Items Addressed This Visit    Situational anxiety    Discussed increased anxiety.  Not on any medication.  Prefers not to restart.  Follow.        Iron deficiency    Off integra.  Follow cbc and iron studies.       Relevant Orders   CBC with Differential/Platelet   IBC + Ferritin   History of CVA (cerebrovascular accident)    Taking aspirin daily.  Recent vision change and symptoms as outlined.  Continue daily aspirin.  Has been evaluated by neurology and cardiology previously.  Discussed with cardiology.  They will work pt in for f/u to discuss if any further w/up warranted.  Has a history of migraine headaches with vision changes as outlined.  Question if recent symptoms related to occular migraines, etc.  F/u with neurology as discussed.        Health care maintenance    Physical today 02/27/20.  PAP 05/06/18 - negative with negative HPV.  Mammogram 01/26/20 - [facility].  Referred to Dr Bary Castilla.  Note reviewed.  Recommended f/u right breast diagnostic mammogram.        Gastroesophageal reflux disease    No upper symptoms reported.  On prilosec.  Factor V Leiden mutation ([place])    Heterozygous.  Evaluated by hematology.       Anemia    Follow cbc and iron  studies.         Other Visit Diagnoses    Encounter for general adult medical examination with abnormal findings    -  Primary       Einar Pheasant, MD

## 2020-03-04 ENCOUNTER — Encounter: Payer: Self-pay | Admitting: Internal Medicine

## 2020-03-04 NOTE — Assessment & Plan Note (Signed)
Physical today 02/27/20.  PAP 05/06/18 - negative with negative HPV.  Mammogram 01/26/20 - [facility].  Referred to Dr Bary Castilla.  Note reviewed.  Recommended f/u right breast diagnostic mammogram.

## 2020-03-04 NOTE — Assessment & Plan Note (Signed)
Follow cbc and iron studies.  

## 2020-03-04 NOTE — Assessment & Plan Note (Signed)
Off integra.  Follow cbc and iron studies.

## 2020-03-04 NOTE — Assessment & Plan Note (Signed)
Discussed increased anxiety.  Not on any medication.  Prefers not to restart.  Follow.

## 2020-03-04 NOTE — Assessment & Plan Note (Signed)
Taking aspirin daily.  Recent vision change and symptoms as outlined.  Continue daily aspirin.  Has been evaluated by neurology and cardiology previously.  Discussed with cardiology.  They will work pt in for f/u to discuss if any further w/up warranted.  Has a history of migraine headaches with vision changes as outlined.  Question if recent symptoms related to occular migraines, etc.  F/u with neurology as discussed.

## 2020-03-04 NOTE — Assessment & Plan Note (Signed)
Heterozygous.  Evaluated by hematology.

## 2020-03-04 NOTE — Assessment & Plan Note (Signed)
No upper symptoms reported. On prilosec.  

## 2020-03-19 ENCOUNTER — Encounter: Payer: Self-pay | Admitting: Internal Medicine

## 2020-03-19 NOTE — Telephone Encounter (Signed)
Can schedule an appt for me to see her and discuss.

## 2020-03-22 ENCOUNTER — Other Ambulatory Visit: Payer: Self-pay

## 2020-03-22 ENCOUNTER — Telehealth (INDEPENDENT_AMBULATORY_CARE_PROVIDER_SITE_OTHER): Payer: Managed Care, Other (non HMO) | Admitting: Internal Medicine

## 2020-03-22 DIAGNOSIS — F418 Other specified anxiety disorders: Secondary | ICD-10-CM | POA: Diagnosis not present

## 2020-03-22 DIAGNOSIS — R42 Dizziness and giddiness: Secondary | ICD-10-CM | POA: Diagnosis not present

## 2020-03-22 DIAGNOSIS — D649 Anemia, unspecified: Secondary | ICD-10-CM | POA: Diagnosis not present

## 2020-03-22 DIAGNOSIS — Z8673 Personal history of transient ischemic attack (TIA), and cerebral infarction without residual deficits: Secondary | ICD-10-CM

## 2020-03-22 DIAGNOSIS — R000 Tachycardia, unspecified: Secondary | ICD-10-CM

## 2020-03-22 MED ORDER — ESCITALOPRAM OXALATE 10 MG PO TABS: 10.0000 mg | ORAL_TABLET | Freq: Every day | ORAL | 1 refills | Status: DC

## 2020-03-22 NOTE — Progress Notes (Signed)
Patient ID: Gwendolyn Torres, female   DOB: 10/01/1970, 49 y.o.   MRN: [id number]   Virtual Visit via video Note  This visit type was conducted due to national recommendations for restrictions regarding the COVID-19 pandemic (e.g. social distancing).  This format is felt to be most appropriate for this patient at this time.  All issues noted in this document were discussed and addressed.  No physical exam was performed (except for noted visual exam findings with Video Visits).   I connected with Gwendolyn Torres by a video enabled telemedicine application and verified that I am speaking with the correct person using two identifiers. Location patient: home Location provider: work  Persons participating in the virtual visit: patient, provider  The limitations, risks, security and privacy concerns of performing an evaluation and management service by video and the availability of in person appointments have been discussed.  It has also been discussed with the patient that there may be a patient responsible charge related to this service. The patient expressed understanding and agreed to proceed.   Reason for visit: work in appt  HPI: Noticed on 03/05/20 - eating - felt swimmy headed.  Later - woke up - heart racing.  Felt like it was beating out of her chest.  Felt tingling down her arms and legs.  Legs felt chilled.  Lasted approximately two hours.  03/07/20 - felt dizzy.  Ears were ringing.  Had headache. Seen at acute care.  Treated for sinusitis with prednisone and zpak.  Also given antivert.  Saw neurology.  Questionin - migraine aura.  Recommended magnesium.  States 03/15/20 - woke in middle of night with heart racing.  Had spell 03/19/20 and last night.  Lasted approximately one hour.  No headache.  No dizziness now.  No chest pain or sob.  Saw cardiology.  Linq tracings - no arrhythmia.  Increased stress.  Neurology had discussed possible starting lexapro to help with stress and anxiety.  Discussed other  possible etiologies.  Discussed sleep apnea.      ROS: See pertinent positives and negatives per HPI.  Past Medical History:  Diagnosis Date  . GERD (gastroesophageal reflux disease)     Past Surgical History:  Procedure Laterality Date  . BUNIONECTOMY Right 09/25/2016   Procedure: lapidus type  modified mcbride;  Surgeon: Albertine Patricia, DPM;  Location: [place];  Service: Podiatry;  Laterality: Right;  . CESAREAN SECTION  2004  . LOOP RECORDER INSERTION N/A 02/19/2017   Procedure: LOOP RECORDER INSERTION;  Surgeon: Isaias Cowman, MD;  Location: [place] [facility];  Service: Cardiovascular;  Laterality: N/A;  . OSTECTOMY Right 09/25/2016   Procedure: OSTECTOMY RT  1SR,2ND 3RD  metatarsal;  Surgeon: Albertine Patricia, DPM;  Location: [place];  Service: Podiatry;  Laterality: Right;  LMA LOCAL  . TEE WITHOUT CARDIOVERSION N/A 10/28/2016   Procedure: TRANSESOPHAGEAL ECHOCARDIOGRAM (TEE);  Surgeon: Teodoro Spray, MD;  Location: [facility];  Service: Cardiovascular;  Laterality: N/A;    Family History  Problem Relation Age of Onset  . Aortic aneurysm Father        also has popliteal aneurysm  . Hypertension Father   . Hypercholesterolemia Father   . Diabetes Father   . Aortic aneurysm Mother   . Hypercholesterolemia Mother   . Hypercholesterolemia Sister        x2  . Hyperlipidemia Brother   . Hypertension Sister   . Breast cancer Maternal Aunt   . Melanoma Maternal Uncle   .  Colon cancer Neg Hx     SOCIAL HX: reviewed.    Current Outpatient Medications:  .  aspirin 81 MG EC tablet, TAKE 1 TABLET BY MOUTH DAILY, Disp: 90 tablet, Rfl: 1 .  escitalopram (LEXAPRO) 10 MG tablet, Take 1 tablet (10 mg total) by mouth daily., Disp: 30 tablet, Rfl: 1 .  omeprazole (PRILOSEC) 20 MG capsule, Take 1 capsule (20 mg total) by mouth daily., Disp: 90 capsule, Rfl: 1 .  Vitamin D, Ergocalciferol, (DRISDOL) 1.25 MG (50000 UNIT) CAPS capsule, Take 50,000 Units  by mouth once a week., Disp: , Rfl:   EXAM:  GENERAL: alert, oriented, appears well and in no acute distress  HEENT: atraumatic, conjunttiva clear, no obvious abnormalities on inspection of external nose and ears  NECK: normal movements of the head and neck  LUNGS: on inspection no signs of respiratory distress, breathing rate appears normal, no obvious gross SOB, gasping or wheezing  CV: no obvious cyanosis  PSYCH/NEURO: pleasant and cooperative, no obvious depression or anxiety, speech and thought processing grossly intact  ASSESSMENT AND PLAN:  Discussed the following assessment and plan:  Problem List Items Addressed This Visit    Situational anxiety    Increased anxiety.  Discussed with her today.  Start lexapro.  Follow.        Relevant Medications   escitalopram (LEXAPRO) 10 MG tablet   Racing heart beat    Increased heart rate and heart pounding as outlined.  Wakes up with heart racing.  Has seen cardiology and neurology.  Discussed possible sleep apnea.  Refer to pulmonary.        Relevant Orders   Ambulatory referral to Pulmonology   History of CVA (cerebrovascular accident)    Continue daily aspirin.        Dizziness    Previous extensive w/up by neurology and cardiology.  Recently evaluated at acute care as outlined. No dizziness now.  Follow.       Anemia    Follow cbc and iron studies.  She was questioning if being off the iron could contribute to these episodes.            I discussed the assessment and treatment plan with the patient. The patient was provided an opportunity to ask questions and all were answered. The patient agreed with the plan and demonstrated an understanding of the instructions.   The patient was advised to call back or seek an in-person evaluation if the symptoms worsen or if the condition fails to improve as anticipated.    Einar Pheasant, MD

## 2020-03-25 ENCOUNTER — Encounter: Payer: Self-pay | Admitting: Internal Medicine

## 2020-03-25 DIAGNOSIS — R000 Tachycardia, unspecified: Secondary | ICD-10-CM | POA: Insufficient documentation

## 2020-03-25 NOTE — Assessment & Plan Note (Signed)
Increased heart rate and heart pounding as outlined.  Wakes up with heart racing.  Has seen cardiology and neurology.  Discussed possible sleep apnea.  Refer to pulmonary.

## 2020-03-25 NOTE — Assessment & Plan Note (Signed)
Follow cbc and iron studies.  She was questioning if being off the iron could contribute to these episodes.

## 2020-03-25 NOTE — Assessment & Plan Note (Signed)
Previous extensive w/up by neurology and cardiology.  Recently evaluated at acute care as outlined. No dizziness now.  Follow.

## 2020-03-25 NOTE — Assessment & Plan Note (Signed)
Continue daily aspirin 

## 2020-03-25 NOTE — Assessment & Plan Note (Signed)
Increased anxiety.  Discussed with her today.  Start lexapro.  Follow.

## 2020-03-26 ENCOUNTER — Other Ambulatory Visit: Payer: Self-pay

## 2020-03-26 ENCOUNTER — Telehealth: Payer: Self-pay | Admitting: Internal Medicine

## 2020-03-26 ENCOUNTER — Telehealth (INDEPENDENT_AMBULATORY_CARE_PROVIDER_SITE_OTHER): Payer: Managed Care, Other (non HMO) | Admitting: Internal Medicine

## 2020-03-26 DIAGNOSIS — U071 COVID-19: Secondary | ICD-10-CM | POA: Diagnosis not present

## 2020-03-26 NOTE — Telephone Encounter (Signed)
Patient called in tested positive for covid wanted to know what Dr.Scott thought about her getting the infusion because she has other issues would like a call back

## 2020-03-26 NOTE — Progress Notes (Signed)
Patient ID: Gwendolyn Torres, female   DOB: July 17, 1970, 49 y.o.   MRN: [id number]   Virtual Visit via video Note  This visit type was conducted due to national recommendations for restrictions regarding the COVID-19 pandemic (e.g. social distancing).  This format is felt to be most appropriate for this patient at this time.  All issues noted in this document were discussed and addressed.  No physical exam was performed (except for noted visual exam findings with Video Visits).   I connected with Gwendolyn Torres by a video enabled telemedicine application and verified that I am speaking with the correct person using two identifiers. Location patient: home Location provider: work Persons participating in the virtual visit: patient, provider  The limitations, risks, security and privacy concerns of performing an evaluation and management service by video and the availability of in person appointments have been discussed.  It has also been discussed with the patient that there may be a patient responsible charge related to this service. The patient expressed understanding and agreed to proceed.   Reason for visit: work in appt  HPI: Work in appt to discuss recent diagnosis of covid and monoclonal antibody infusion.  States symptoms started two days ago with stuffy nose/face hurting.  Ears stopped up.  No sore throat.  No significant cough.  Some nasal congestion - blood tinged.  Took home covid test.  Daughter also tested positive.  No sob.  No chest pain or chest tightness.  Did take pfizer vaccine x 2 and had moderna booster last week.  Had questions about taking monoclonal antibody infusion.     ROS: See pertinent positives and negatives per HPI.  Past Medical History:  Diagnosis Date  . GERD (gastroesophageal reflux disease)     Past Surgical History:  Procedure Laterality Date  . BUNIONECTOMY Right 09/25/2016   Procedure: lapidus type  modified mcbride;  Surgeon: Albertine Patricia, DPM;  Location:  [place];  Service: Podiatry;  Laterality: Right;  . CESAREAN SECTION  2004  . LOOP RECORDER INSERTION N/A 02/19/2017   Procedure: LOOP RECORDER INSERTION;  Surgeon: Isaias Cowman, MD;  Location: [place] [facility];  Service: Cardiovascular;  Laterality: N/A;  . OSTECTOMY Right 09/25/2016   Procedure: OSTECTOMY RT  1SR,2ND 3RD  metatarsal;  Surgeon: Albertine Patricia, DPM;  Location: [place];  Service: Podiatry;  Laterality: Right;  LMA LOCAL  . TEE WITHOUT CARDIOVERSION N/A 10/28/2016   Procedure: TRANSESOPHAGEAL ECHOCARDIOGRAM (TEE);  Surgeon: Teodoro Spray, MD;  Location: [facility];  Service: Cardiovascular;  Laterality: N/A;    Family History  Problem Relation Age of Onset  . Aortic aneurysm Father        also has popliteal aneurysm  . Hypertension Father   . Hypercholesterolemia Father   . Diabetes Father   . Aortic aneurysm Mother   . Hypercholesterolemia Mother   . Hypercholesterolemia Sister        x2  . Hyperlipidemia Brother   . Hypertension Sister   . Breast cancer Maternal Aunt   . Melanoma Maternal Uncle   . Colon cancer Neg Hx     SOCIAL HX: reviewed.    Current Outpatient Medications:  .  aspirin 81 MG EC tablet, TAKE 1 TABLET BY MOUTH DAILY, Disp: 90 tablet, Rfl: 1 .  escitalopram (LEXAPRO) 10 MG tablet, Take 1 tablet (10 mg total) by mouth daily., Disp: 30 tablet, Rfl: 1 .  omeprazole (PRILOSEC) 20 MG capsule, Take 1 capsule (20 mg total) by mouth  daily., Disp: 90 capsule, Rfl: 1 .  Vitamin D, Ergocalciferol, (DRISDOL) 1.25 MG (50000 UNIT) CAPS capsule, Take 50,000 Units by mouth once a week., Disp: , Rfl:   EXAM:  GENERAL: alert, oriented, appears well and in no acute distress  HEENT: atraumatic, conjunttiva clear, no obvious abnormalities on inspection of external nose and ears  NECK: normal movements of the head and neck  LUNGS: on inspection no signs of respiratory distress, breathing rate appears normal, no obvious gross  SOB, gasping or wheezing  CV: no obvious cyanosis  PSYCH/NEURO: pleasant and cooperative, no obvious depression or anxiety, speech and thought processing grossly intact  ASSESSMENT AND PLAN:  Discussed the following assessment and plan:  Problem List Items Addressed This Visit    COVID-19 virus infection    Diagnosed recently with covid.  Discussed using saline nasal spray and flonase as directed.  No significant cough or chest pain .  No sob.  Rest.  Fluids.  Discussed with ID.  No contraindication to monoclonal antibody infusion.  Call with update.            I discussed the assessment and treatment plan with the patient. The patient was provided an opportunity to ask questions and all were answered. The patient agreed with the plan and demonstrated an understanding of the instructions.   The patient was advised to call back or seek an in-person evaluation if the symptoms worsen or if the condition fails to improve as anticipated.    Einar Pheasant, MD

## 2020-03-26 NOTE — Telephone Encounter (Signed)
Pt scheduled with Dr Scott 

## 2020-03-27 ENCOUNTER — Encounter: Payer: Self-pay | Admitting: Internal Medicine

## 2020-03-31 ENCOUNTER — Encounter: Payer: Self-pay | Admitting: Internal Medicine

## 2020-03-31 DIAGNOSIS — U071 COVID-19: Secondary | ICD-10-CM | POA: Insufficient documentation

## 2020-03-31 NOTE — Assessment & Plan Note (Signed)
Diagnosed recently with covid.  Discussed using saline nasal spray and flonase as directed.  No significant cough or chest pain .  No sob.  Rest.  Fluids.  Discussed with ID.  No contraindication to monoclonal antibody infusion.  Call with update.

## 2020-04-10 ENCOUNTER — Other Ambulatory Visit: Payer: Self-pay | Admitting: Neurology

## 2020-04-10 DIAGNOSIS — R42 Dizziness and giddiness: Secondary | ICD-10-CM

## 2020-04-10 DIAGNOSIS — H539 Unspecified visual disturbance: Secondary | ICD-10-CM

## 2020-04-13 ENCOUNTER — Other Ambulatory Visit: Payer: Self-pay | Admitting: Internal Medicine

## 2020-04-20 ENCOUNTER — Other Ambulatory Visit: Payer: Self-pay

## 2020-04-20 ENCOUNTER — Ambulatory Visit
Admission: RE | Admit: 2020-04-20 | Discharge: 2020-04-20 | Disposition: A | Discharge status: Home / Self Care | Payer: Managed Care, Other (non HMO) | Source: Ambulatory Visit | Attending: Neurology | Admitting: Neurology

## 2020-04-20 DIAGNOSIS — H539 Unspecified visual disturbance: Secondary | ICD-10-CM | POA: Diagnosis not present

## 2020-04-20 DIAGNOSIS — R42 Dizziness and giddiness: Secondary | ICD-10-CM | POA: Insufficient documentation

## 2020-04-20 MED ORDER — GADOBUTROL 1 MMOL/ML IV SOLN
10.0000 mL | Freq: Once | INTRAVENOUS | Status: AC | PRN
  Administered 2020-04-20: 10 mL via INTRAVENOUS

## 2020-04-25 ENCOUNTER — Other Ambulatory Visit: Payer: Self-pay | Admitting: Pulmonary Disease

## 2020-04-25 ENCOUNTER — Ambulatory Visit
Admission: RE | Admit: 2020-04-25 | Discharge: 2020-04-25 | Disposition: A | Discharge status: Home / Self Care | Payer: Managed Care, Other (non HMO) | Source: Ambulatory Visit | Attending: Pulmonary Disease | Admitting: Pulmonary Disease

## 2020-04-25 ENCOUNTER — Other Ambulatory Visit: Payer: Self-pay

## 2020-04-25 DIAGNOSIS — R7989 Other specified abnormal findings of blood chemistry: Secondary | ICD-10-CM

## 2020-04-25 MED ORDER — IOHEXOL 350 MG/ML SOLN
75.0000 mL | Freq: Once | INTRAVENOUS | Status: AC | PRN
  Administered 2020-04-25: 75 mL via INTRAVENOUS

## 2020-04-26 ENCOUNTER — Ambulatory Visit: Payer: Managed Care, Other (non HMO) | Admitting: Internal Medicine

## 2020-04-26 ENCOUNTER — Other Ambulatory Visit: Payer: Self-pay

## 2020-04-26 DIAGNOSIS — F418 Other specified anxiety disorders: Secondary | ICD-10-CM

## 2020-04-26 DIAGNOSIS — Z8673 Personal history of transient ischemic attack (TIA), and cerebral infarction without residual deficits: Secondary | ICD-10-CM

## 2020-04-26 DIAGNOSIS — R000 Tachycardia, unspecified: Secondary | ICD-10-CM | POA: Diagnosis not present

## 2020-04-26 DIAGNOSIS — D6851 Activated protein C resistance: Secondary | ICD-10-CM

## 2020-04-26 DIAGNOSIS — K219 Gastro-esophageal reflux disease without esophagitis: Secondary | ICD-10-CM

## 2020-04-26 DIAGNOSIS — R42 Dizziness and giddiness: Secondary | ICD-10-CM

## 2020-04-26 DIAGNOSIS — E611 Iron deficiency: Secondary | ICD-10-CM

## 2020-04-26 NOTE — Progress Notes (Signed)
Patient ID: Gwendolyn Torres, female   DOB: 02/14/1971, 49 y.o.   MRN: [id number]   Subjective:    Patient ID: Gwendolyn Torres, female    DOB: 26-Apr-1971, 49 y.o.   MRN: [id number]  HPI This visit occurred during the SARS-CoV-2 public health emergency.  Safety protocols were in place, including screening questions prior to the visit, additional usage of staff PPE, and extensive cleaning of exam room while observing appropriate contact time as indicated for disinfecting solutions.  Patient here for a scheduled follow up.  Recent covid infection.  Received monoclonal antibody infusion. No residual problems reported.  Saw Dr Manuella Ghazi in follow up 04/04/20.  Diagnosed with recurrent vision changes with paresthesia concerning for migraine aura - dizziness and tinnitus.  Recommended magnesium.  Prescribed ubrelvy and tylenol. MRI 04/20/20 - no acute abnormality and no significant interval change.  Vestibular rehab ordered.  Evaluated by Dr Laural Roes.  D dimer positive.  CT chest - no PE, minimal residual ground glass opacity c/w prior covid 10 infection.  No other focal abnormality.  Overall she is feeling better.  Breathing stable.  No increased sob.  Increased heart rate/tingling - better.  Eating.  No nausea or vomiting.  Bowels moving.  Handling stress. On lexapro.  Feels makes her sleepy/tired.  Discussed adjusting dose.     Past Medical History:  Diagnosis Date  . GERD (gastroesophageal reflux disease)    Past Surgical History:  Procedure Laterality Date  . BUNIONECTOMY Right 09/25/2016   Procedure: lapidus type  modified mcbride;  Surgeon: Albertine Patricia, DPM;  Location: [place];  Service: Podiatry;  Laterality: Right;  . CESAREAN SECTION  2004  . LOOP RECORDER INSERTION N/A 02/19/2017   Procedure: LOOP RECORDER INSERTION;  Surgeon: Isaias Cowman, MD;  Location: [place] [facility];  Service: Cardiovascular;  Laterality: N/A;  . OSTECTOMY Right 09/25/2016   Procedure: OSTECTOMY RT   1SR,2ND 3RD  metatarsal;  Surgeon: Albertine Patricia, DPM;  Location: [place];  Service: Podiatry;  Laterality: Right;  LMA LOCAL  . TEE WITHOUT CARDIOVERSION N/A 10/28/2016   Procedure: TRANSESOPHAGEAL ECHOCARDIOGRAM (TEE);  Surgeon: Teodoro Spray, MD;  Location: [facility];  Service: Cardiovascular;  Laterality: N/A;   Family History  Problem Relation Age of Onset  . Aortic aneurysm Father        also has popliteal aneurysm  . Hypertension Father   . Hypercholesterolemia Father   . Diabetes Father   . Aortic aneurysm Mother   . Hypercholesterolemia Mother   . Hypercholesterolemia Sister        x2  . Hyperlipidemia Brother   . Hypertension Sister   . Breast cancer Maternal Aunt   . Melanoma Maternal Uncle   . Colon cancer Neg Hx    Social History   Socioeconomic History  . Marital status: Married    Spouse name: Not on file  . Number of children: 1  . Years of education: Not on file  . Highest education level: Not on file  Occupational History  . Occupation: transcriptionist  Tobacco Use  . Smoking status: Never Smoker  . Smokeless tobacco: Never Used  Vaping Use  . Vaping Use: Never used  Substance and Sexual Activity  . Alcohol use: No    Alcohol/week: 0.0 standard drinks  . Drug use: No  . Sexual activity: Yes    Birth control/protection: None  Other Topics Concern  . Not on file  Social History Narrative  . Not on file  Social Determinants of Health   Financial Resource Strain: Not on file  Food Insecurity: Not on file  Transportation Needs: Not on file  Physical Activity: Not on file  Stress: Not on file  Social Connections: Not on file    Outpatient Encounter Medications as of 04/26/2020  Medication Sig  . aspirin 81 MG EC tablet TAKE 1 TABLET BY MOUTH DAILY  . escitalopram (LEXAPRO) 10 MG tablet TAKE 1 TABLET BY MOUTH EVERY DAY  . omeprazole (PRILOSEC) 20 MG capsule Take 1 capsule (20 mg total) by mouth daily.  . Vitamin D,  Ergocalciferol, (DRISDOL) 1.25 MG (50000 UNIT) CAPS capsule Take 50,000 Units by mouth once a week.   No facility-administered encounter medications on file as of 04/26/2020.    Review of Systems  Constitutional: Negative for appetite change and unexpected weight change.  HENT: Negative for congestion.   Respiratory: Negative for cough and chest tightness.        Breathing stable.    Cardiovascular: Negative for chest pain, palpitations and leg swelling.  Gastrointestinal: Negative for abdominal pain, diarrhea, nausea and vomiting.  Genitourinary: Negative for difficulty urinating and dysuria.  Musculoskeletal: Negative for joint swelling and myalgias.  Skin: Negative for color change and rash.  Neurological: Negative for headaches.       Dizziness/sensations - improved.   Psychiatric/Behavioral: Negative for agitation and dysphoric mood.       Objective:    Physical Exam Vitals reviewed.  Constitutional:      General: She is not in acute distress.    Appearance: Normal appearance.  HENT:     Head: Normocephalic and atraumatic.     Right Ear: External ear normal.     Left Ear: External ear normal.     Mouth/Throat:     Mouth: Oropharynx is clear and moist.  Eyes:     General: No scleral icterus.       Right eye: No discharge.        Left eye: No discharge.     Conjunctiva/sclera: Conjunctivae normal.  Neck:     Thyroid: No thyromegaly.  Cardiovascular:     Rate and Rhythm: Normal rate and regular rhythm.  Pulmonary:     Effort: No respiratory distress.     Breath sounds: Normal breath sounds. No wheezing.  Abdominal:     General: Bowel sounds are normal.     Palpations: Abdomen is soft.     Tenderness: There is no abdominal tenderness.  Musculoskeletal:        General: No swelling, tenderness or edema.     Cervical back: Neck supple. No tenderness.  Lymphadenopathy:     Cervical: No cervical adenopathy.  Skin:    Findings: No erythema or rash.  Neurological:      Mental Status: She is alert.  Psychiatric:        Mood and Affect: Mood normal.        Behavior: Behavior normal.     BP 120/68   Pulse 90   Temp 98.5 F (36.9 C) (Oral)   Resp 16   Wt 262 lb (118.8 kg)   LMP 04/11/2020   SpO2 98%   BMI 40.88 kg/m  Wt Readings from Last 3 Encounters:  04/26/20 262 lb (118.8 kg)  02/27/20 261 lb (118.4 kg)  11/22/19 254 lb (115.2 kg)     Lab Results  Component Value Date   WBC 7.0 02/23/2020   HGB 12.4 02/23/2020   HCT 36.8 02/23/2020   PLT  291.0 02/23/2020   GLUCOSE 94 11/22/2019   CHOL 160 06/09/2019   TRIG 95.0 06/09/2019   HDL 43.40 06/09/2019   LDLCALC 97 06/09/2019   ALT 18 11/22/2019   AST 16 11/22/2019   NA 136 11/22/2019   K 4.3 11/22/2019   CL 102 11/22/2019   CREATININE 0.87 11/22/2019   BUN 16 11/22/2019   CO2 28 11/22/2019   TSH 1.56 06/09/2019   INR 1.04 10/16/2016   HGBA1C 5.4 10/17/2016    CT ANGIO CHEST PE W OR WO CONTRAST  Result Date: 04/25/2020 CLINICAL DATA:  History of previous COVID-19 infection with positive D-dimer EXAM: CT ANGIOGRAPHY CHEST WITH CONTRAST TECHNIQUE: Multidetector CT imaging of the chest was performed using the standard protocol during bolus administration of intravenous contrast. Multiplanar CT image reconstructions and MIPs were obtained to evaluate the vascular anatomy. CONTRAST:  14mL OMNIPAQUE IOHEXOL 350 MG/ML SOLN COMPARISON:  None. FINDINGS: Cardiovascular: Thoracic aorta and its branches are well visualized and within normal limits. No aneurysmal dilatation is seen. No cardiac enlargement is noted. Pulmonary artery shows a normal branching pattern without intraluminal filling defect to suggest pulmonary embolism. Mediastinum/Nodes: Thoracic inlet is within normal limits. No sizable hilar or mediastinal adenopathy is noted. The esophagus is unremarkable. Lungs/Pleura: Lungs are well aerated bilaterally. Minimal ground-glass opacity is noted in the peripheral aspect of the left  lower lobe best seen on image number 54 of series 6 consistent with sequelae from the prior COVID-19 infection. Upper Abdomen: Visualized upper abdomen is within normal limits. Musculoskeletal: Degenerative changes of the thoracic spine are noted. No acute bony abnormality is noted. Review of the MIP images confirms the above findings. IMPRESSION: No evidence of pulmonary emboli. Minimal residual ground-glass opacity consistent with the prior COVID-19 infection. No other focal abnormality is noted. Electronically Signed   By: Alcide Clever M.D.   On: 04/25/2020 14:24       Assessment & Plan:   Problem List Items Addressed This Visit    Situational anxiety    On lexapro.  Feels making her sleepy/more fatigued.  Decrease dose to 5mg  q day.  Follow.        History of CVA (cerebrovascular accident)    Continue daily aspirin.  No acute abnormality noted on recent MRI.       Dizziness    Seeing neurology as outlined.  Recent MRI as outlined.  Better.  Follow.       Iron deficiency    Recent hgb ok.  Follow.        Factor V Leiden mutation [facility])    Evaluated by hematology.  Heterozygous.        Gastroesophageal reflux disease    No upper symptoms reported. On prilosec.        Racing heart beat    Has seen pulmonary.  Recent CT - no acute abnormality - no PE.  Results as outlined.  Overall feels better.  Has seen cardiology.  Follow.            [facility], MD

## 2020-04-26 NOTE — Patient Instructions (Signed)
Decrease lexapro to 10mg  - 1/2 tablet per day  Vitamin D3 1000 units per day.

## 2020-04-30 ENCOUNTER — Other Ambulatory Visit: Payer: Managed Care, Other (non HMO)

## 2020-05-03 ENCOUNTER — Encounter: Payer: Self-pay | Admitting: Internal Medicine

## 2020-05-03 NOTE — Assessment & Plan Note (Signed)
Continue daily aspirin.  No acute abnormality noted on recent MRI.

## 2020-05-03 NOTE — Assessment & Plan Note (Signed)
On lexapro.  Feels making her sleepy/more fatigued.  Decrease dose to 5mg  q day.  Follow.

## 2020-05-03 NOTE — Assessment & Plan Note (Signed)
Seeing neurology as outlined.  Recent MRI as outlined.  Better.  Follow.

## 2020-05-03 NOTE — Assessment & Plan Note (Signed)
Has seen pulmonary.  Recent CT - no acute abnormality - no PE.  Results as outlined.  Overall feels better.  Has seen cardiology.  Follow.

## 2020-05-03 NOTE — Assessment & Plan Note (Signed)
Evaluated by hematology.  Heterozygous.

## 2020-05-03 NOTE — Assessment & Plan Note (Signed)
Recent hgb ok.  Follow.

## 2020-05-03 NOTE — Assessment & Plan Note (Signed)
No upper symptoms reported. On prilosec.  

## 2020-05-17 LAB — HM COLONOSCOPY

## 2020-06-15 ENCOUNTER — Other Ambulatory Visit: Payer: Self-pay | Admitting: General Surgery

## 2020-06-15 DIAGNOSIS — Z1231 Encounter for screening mammogram for malignant neoplasm of breast: Secondary | ICD-10-CM

## 2020-07-25 ENCOUNTER — Ambulatory Visit
Admission: RE | Admit: 2020-07-25 | Discharge: 2020-07-25 | Disposition: A | Discharge status: Home / Self Care | Payer: Managed Care, Other (non HMO) | Source: Ambulatory Visit | Attending: General Surgery | Admitting: General Surgery

## 2020-07-25 ENCOUNTER — Other Ambulatory Visit: Payer: Self-pay

## 2020-07-25 DIAGNOSIS — Z1231 Encounter for screening mammogram for malignant neoplasm of breast: Secondary | ICD-10-CM

## 2020-08-23 ENCOUNTER — Other Ambulatory Visit: Payer: Managed Care, Other (non HMO)

## 2020-08-27 ENCOUNTER — Other Ambulatory Visit: Payer: Self-pay

## 2020-08-27 ENCOUNTER — Telehealth (INDEPENDENT_AMBULATORY_CARE_PROVIDER_SITE_OTHER): Payer: Managed Care, Other (non HMO) | Admitting: Internal Medicine

## 2020-08-27 DIAGNOSIS — D6851 Activated protein C resistance: Secondary | ICD-10-CM

## 2020-08-27 DIAGNOSIS — F418 Other specified anxiety disorders: Secondary | ICD-10-CM

## 2020-08-27 DIAGNOSIS — R42 Dizziness and giddiness: Secondary | ICD-10-CM

## 2020-08-27 DIAGNOSIS — Z8673 Personal history of transient ischemic attack (TIA), and cerebral infarction without residual deficits: Secondary | ICD-10-CM

## 2020-08-27 DIAGNOSIS — Z6841 Body Mass Index (BMI) 40.0 and over, adult: Secondary | ICD-10-CM

## 2020-08-27 DIAGNOSIS — K219 Gastro-esophageal reflux disease without esophagitis: Secondary | ICD-10-CM | POA: Diagnosis not present

## 2020-08-27 DIAGNOSIS — E611 Iron deficiency: Secondary | ICD-10-CM | POA: Diagnosis not present

## 2020-08-27 DIAGNOSIS — G43109 Migraine with aura, not intractable, without status migrainosus: Secondary | ICD-10-CM

## 2020-08-27 NOTE — Progress Notes (Signed)
Patient ID: Gwendolyn Torres, female   DOB: [birth year], 50 y.o.   MRN: [id number]   Virtual Visit via video Note  This visit type was conducted due to national recommendations for restrictions regarding the COVID-19 pandemic (e.g. social distancing).  This format is felt to be most appropriate for this patient at this time.  All issues noted in this document were discussed and addressed.  No physical exam was performed (except for noted visual exam findings with Video Visits).   I connected with Gwendolyn Torres by a video enabled telemedicine application and verified that I am speaking with the correct person using two identifiers. Location patient: home Location provider:  work Persons participating in the virtual visit: patient, provider  The limitations, risks, security and privacy concerns of performing an evaluation and management service by video and the availability of in person appointments have been discussed.  It has also been discussed with the patient that there may be a patient responsible charge related to this service. The patient expressed understanding and agreed to proceed.   Reason for visit: scheduled follow up.    HPI: Here to follow up regarding previous abnormal mammogram and recurrent vision changes with paresthesia concerning for migraine aura. Seeing neurology.  Previous MRI 04/20/20 - no acute intracranial abnormality.  No significant interval change of the small left frontal meningioma.  Old lacunar infarct.  Recommended magnesium daily.  ubrelvy as needed.  Also discussed Dix- Hallpike maneuver and BPPV.  Also saw ENT - fluid - ear.  Diagnosed BPPV.  S/p epley maneuvers.  Doing better.  Does have some current sinus congestion.  covid test negative.  Taking mucinex sinus.  Discussed nasal spray.  No fever.  No chest pain.  No increased cough or congestion.  Breathing stable.  On lexapro.  Off iron.  Is walking.  [employer].  Interested in ozempic.  Also recently saw Dr Bary Castilla  for abnormal mammogram.  Recommended 6 month f/u.     ROS: See pertinent positives and negatives per HPI.  Past Medical History:  Diagnosis Date  . GERD (gastroesophageal reflux disease)     Past Surgical History:  Procedure Laterality Date  . BUNIONECTOMY Right 09/25/2016   Procedure: lapidus type  modified mcbride;  Surgeon: Albertine Patricia, DPM;  Location: [facility];  Service: Podiatry;  Laterality: Right;  . CESAREAN SECTION  2004  . LOOP RECORDER INSERTION N/A 02/19/2017   Procedure: LOOP RECORDER INSERTION;  Surgeon: Isaias Cowman, MD;  Location: [facility];  Service: Cardiovascular;  Laterality: N/A;  . OSTECTOMY Right 09/25/2016   Procedure: OSTECTOMY RT  1SR,2ND 3RD  metatarsal;  Surgeon: Albertine Patricia, DPM;  Location: [facility];  Service: Podiatry;  Laterality: Right;  LMA LOCAL  . TEE WITHOUT CARDIOVERSION N/A 10/28/2016   Procedure: TRANSESOPHAGEAL ECHOCARDIOGRAM (TEE);  Surgeon: Teodoro Spray, MD;  Location: [facility];  Service: Cardiovascular;  Laterality: N/A;    Family History  Problem Relation Age of Onset  . Aortic aneurysm Father        also has popliteal aneurysm  . Hypertension Father   . Hypercholesterolemia Father   . Diabetes Father   . Aortic aneurysm Mother   . Hypercholesterolemia Mother   . Hypercholesterolemia Sister        x2  . Hyperlipidemia Brother   . Hypertension Sister   . Breast cancer Maternal Aunt   . Melanoma Maternal Uncle   . Colon cancer Neg Hx     SOCIAL HX:  reviewed.    Current Outpatient Medications:  .  aspirin 81 MG EC tablet, TAKE 1 TABLET BY MOUTH DAILY, Disp: 90 tablet, Rfl: 1 .  Cholecalciferol 25 MCG (1000 UT) tablet, Take 1,000 Units by mouth daily., Disp: , Rfl:  .  escitalopram (LEXAPRO) 10 MG tablet, TAKE 1 TABLET BY MOUTH EVERY DAY, Disp: 90 tablet, Rfl: 1 .  omeprazole (PRILOSEC) 20 MG capsule, Take 1 capsule (20 mg total) by mouth daily., Disp: 90 capsule, Rfl:  1  EXAM:  GENERAL: alert, oriented, appears well and in no acute distress  HEENT: atraumatic, conjunttiva clear, no obvious abnormalities on inspection of external nose and ears  NECK: normal movements of the head and neck  LUNGS: on inspection no signs of respiratory distress, breathing rate appears normal, no obvious gross SOB, gasping or wheezing  CV: no obvious cyanosis  PSYCH/NEURO: pleasant and cooperative, no obvious depression or anxiety, speech and thought processing grossly intact  ASSESSMENT AND PLAN:  Discussed the following assessment and plan:  Problem List Items Addressed This Visit    BMI 40.0-44.9, adult ([place])    Desires weight loss.  Has started exercising. Watching diet.  Interested in ozempic.  Discussed GLP-1 agonists.  Discussed CCM referral. She is interested.  Follow.       Relevant Orders   AMB Referral to [facility]   Dizziness    Has seen ENT and neurology.  MRI as outlined.  No acute abnormality.  ENT - BPPV - fluid - ear.  Epley maneuvers.  Has been instructed on home maneuvers.  Follow.       Relevant Orders   Comprehensive metabolic panel   TSH   Factor V Leiden mutation ([place])    Evaluated by hematology.  Hetrozygous.        Gastroesophageal reflux disease    No upper symptoms reported.  On prilosec.       History of CVA (cerebrovascular accident)    No acute abnormality noted on MRI. Continue daily aspirin.       Relevant Orders   Lipid panel   Iron deficiency    Off iron.  Check cbc and iron studies.        Relevant Orders   CBC with Differential/Platelet   Ferritin   Migraine    Saw neurology. Magnesium.  Ubrevly.  Follow.       Situational anxiety    Continue on lexapro. Now on 5mg  q day.  Doing well on this dose.  Follow.           I discussed the assessment and treatment plan with the patient. The patient was provided an opportunity to ask questions and all were answered. The patient agreed with the  plan and demonstrated an understanding of the instructions.   The patient was advised to call back or seek an in-person evaluation if the symptoms worsen or if the condition fails to improve as anticipated.   Einar Pheasant, MD

## 2020-09-02 ENCOUNTER — Encounter: Payer: Self-pay | Admitting: Internal Medicine

## 2020-09-02 DIAGNOSIS — G43909 Migraine, unspecified, not intractable, without status migrainosus: Secondary | ICD-10-CM | POA: Insufficient documentation

## 2020-09-02 NOTE — Assessment & Plan Note (Signed)
No acute abnormality noted on MRI. Continue daily aspirin.

## 2020-09-02 NOTE — Assessment & Plan Note (Signed)
Continue on lexapro. Now on 5mg  q day.  Doing well on this dose.  Follow.

## 2020-09-02 NOTE — Assessment & Plan Note (Signed)
Has seen ENT and neurology.  MRI as outlined.  No acute abnormality.  ENT - BPPV - fluid - ear.  Epley maneuvers.  Has been instructed on home maneuvers.  Follow.

## 2020-09-02 NOTE — Assessment & Plan Note (Signed)
No upper symptoms reported. On prilosec.  

## 2020-09-02 NOTE — Assessment & Plan Note (Signed)
Off iron.  Check cbc and iron studies.

## 2020-09-02 NOTE — Assessment & Plan Note (Signed)
Desires weight loss.  Has started exercising. Watching diet.  Interested in ozempic.  Discussed GLP-1 agonists.  Discussed CCM referral. She is interested.  Follow.

## 2020-09-02 NOTE — Assessment & Plan Note (Signed)
Saw neurology. Magnesium.  Ubrevly.  Follow.

## 2020-09-02 NOTE — Assessment & Plan Note (Signed)
Evaluated by hematology.  Hetrozygous.   

## 2020-09-03 ENCOUNTER — Telehealth: Payer: Self-pay

## 2020-09-03 NOTE — Chronic Care Management (AMB) (Signed)
  Care Management   Note  09/03/2020 Name: Gwendolyn Torres MRN: [id number] DOB: 07/22/1970  Gwendolyn Torres is a 50 y.o. year old female who is a primary care patient of Einar Pheasant, MD. I reached out to [name] by phone today in response to a referral sent by [name]'s PCP Einar Pheasant, MD .    [name] was given information about care management services today including:  1. Care management services include personalized support from designated clinical staff supervised by her physician, including individualized plan of care and coordination with other care providers 2. 24/7 contact phone numbers for assistance for urgent and routine care needs. 3. The patient may stop care management services at any time by phone call to the office staff.  Patient agreed to services and verbal consent obtained.   Follow up plan: Telephone appointment with care management team member scheduled for:09/27/2020  Noreene Larsson, [place], [place], [place] [address] Direct Dial: [phone] [email or web address]

## 2020-09-04 ENCOUNTER — Other Ambulatory Visit: Payer: Self-pay

## 2020-09-04 ENCOUNTER — Other Ambulatory Visit (INDEPENDENT_AMBULATORY_CARE_PROVIDER_SITE_OTHER): Payer: Managed Care, Other (non HMO)

## 2020-09-04 DIAGNOSIS — R42 Dizziness and giddiness: Secondary | ICD-10-CM | POA: Diagnosis not present

## 2020-09-04 DIAGNOSIS — Z8673 Personal history of transient ischemic attack (TIA), and cerebral infarction without residual deficits: Secondary | ICD-10-CM

## 2020-09-04 DIAGNOSIS — E611 Iron deficiency: Secondary | ICD-10-CM | POA: Diagnosis not present

## 2020-09-04 LAB — COMPREHENSIVE METABOLIC PANEL
ALT: 16 U/L (ref 0–35)
AST: 17 U/L (ref 0–37)
Albumin: 4 g/dL (ref 3.5–5.2)
Alkaline Phosphatase: 66 U/L (ref 39–117)
BUN: 26 mg/dL — ABNORMAL HIGH (ref 6–23)
CO2: 24 mEq/L (ref 19–32)
Calcium: 9.1 mg/dL (ref 8.4–10.5)
Chloride: 103 mEq/L (ref 96–112)
Creatinine, Ser: 0.95 mg/dL (ref 0.40–1.20)
GFR: 70.59 mL/min (ref >60.00)
Glucose, Bld: 102 mg/dL — ABNORMAL HIGH (ref 70–99)
Potassium: 4.2 mEq/L (ref 3.5–5.1)
Sodium: 136 mEq/L (ref 135–145)
Total Bilirubin: 0.4 mg/dL (ref 0.2–1.2)
Total Protein: 7.2 g/dL (ref 6.0–8.3)

## 2020-09-04 LAB — CBC WITH DIFFERENTIAL/PLATELET
Basophils Absolute: 0.1 10*3/uL (ref 0.0–0.1)
Basophils Relative: 0.7 % (ref 0.0–3.0)
Eosinophils Absolute: 0.1 10*3/uL (ref 0.0–0.7)
Eosinophils Relative: 1.3 % (ref 0.0–5.0)
HCT: 35.7 % — ABNORMAL LOW (ref 36.0–46.0)
Hemoglobin: 12.1 g/dL (ref 12.0–15.0)
Lymphocytes Relative: 24.5 % (ref 12.0–46.0)
Lymphs Abs: 1.9 10*3/uL (ref 0.7–4.0)
MCHC: 33.9 g/dL (ref 30.0–36.0)
MCV: 81.2 fl (ref 78.0–100.0)
Monocytes Absolute: 0.7 10*3/uL (ref 0.1–1.0)
Monocytes Relative: 8.9 % (ref 3.0–12.0)
Neutro Abs: 5.1 10*3/uL (ref 1.4–7.7)
Neutrophils Relative %: 64.6 % (ref 43.0–77.0)
Platelets: 312 10*3/uL (ref 150.0–400.0)
RBC: 4.4 Mil/uL (ref 3.87–5.11)
RDW: 14.8 % (ref 11.5–15.5)
WBC: 7.9 10*3/uL (ref 4.0–10.5)

## 2020-09-04 LAB — LIPID PANEL
Cholesterol: 152 mg/dL (ref 0–200)
HDL: 43.8 mg/dL (ref >39.00)
LDL Cholesterol: 88 mg/dL (ref 0–99)
NonHDL: 107.79
Total CHOL/HDL Ratio: 3
Triglycerides: 97 mg/dL (ref 0.0–149.0)
VLDL: 19.4 mg/dL (ref 0.0–40.0)

## 2020-09-04 LAB — TSH: TSH: 1.5 u[IU]/mL (ref 0.35–4.50)

## 2020-09-04 LAB — FERRITIN: Ferritin: 14.6 ng/mL (ref 10.0–291.0)

## 2020-09-27 ENCOUNTER — Ambulatory Visit: Payer: Managed Care, Other (non HMO) | Admitting: Pharmacist

## 2020-09-27 DIAGNOSIS — K219 Gastro-esophageal reflux disease without esophagitis: Secondary | ICD-10-CM

## 2020-09-27 DIAGNOSIS — Z8673 Personal history of transient ischemic attack (TIA), and cerebral infarction without residual deficits: Secondary | ICD-10-CM

## 2020-09-27 DIAGNOSIS — D6851 Activated protein C resistance: Secondary | ICD-10-CM

## 2020-09-27 DIAGNOSIS — F418 Other specified anxiety disorders: Secondary | ICD-10-CM

## 2020-09-27 DIAGNOSIS — Z6841 Body Mass Index (BMI) 40.0 and over, adult: Secondary | ICD-10-CM

## 2020-09-27 NOTE — Chronic Care Management (AMB) (Signed)
Care Management   Pharmacy Note  09/27/2020 Name: Gwendolyn Torres MRN: [id number] DOB: [birth year]  Subjective: Gwendolyn Torres is a 50 y.o. year old female who is a primary care patient of Einar Pheasant, MD. The Care Management team was consulted for assistance with care management and care coordination needs.    Engaged with patient by telephone for initial visit in response to provider referral for pharmacy case management and/or care coordination services.   The patient was given information about Care Management services today including:  1. Care Management services includes personalized support from designated clinical staff supervised by the patient's primary care provider, including individualized plan of care and coordination with other care providers. 2. 24/7 contact phone numbers for assistance for urgent and routine care needs. 3. The patient may stop case management services at any time by phone call to the office staff.  Patient agreed to services and consent obtained.  Assessment:  Review of patient status, including review of consultants reports, laboratory and other test data, was performed as part of comprehensive evaluation and provision of chronic care management services.   SDOH (Social Determinants of Health) assessments and interventions performed:    Objective:  Lab Results  Component Value Date   CREATININE 0.95 09/04/2020   CREATININE 0.87 11/22/2019   CREATININE 0.80 06/09/2019    Lab Results  Component Value Date   HGBA1C 5.4 10/17/2016       Component Value Date/Time   CHOL 152 09/04/2020 0738   TRIG 97.0 09/04/2020 0738   HDL 43.80 09/04/2020 0738   CHOLHDL 3 09/04/2020 0738   VLDL 19.4 09/04/2020 0738   LDLCALC 88 09/04/2020 0738    Clinical ASCVD: Yes  - hx CVA The 10-year ASCVD risk score Mikey Bussing DC Jr., et al., 2013) is: 0.9%   Values used to calculate the score:     Age: 49 years     Sex: Female     Is Non-Hispanic African American: No      Diabetic: No     Tobacco smoker: No     Systolic Blood Pressure: 956 mmHg     Is BP treated: No     HDL Cholesterol: 43.8 mg/dL     Total Cholesterol: 152 mg/dL      BP Readings from Last 3 Encounters:  04/26/20 120/68  02/27/20 124/76  11/22/19 124/78    Care Plan  Allergies  Allergen Reactions  . Penicillins Rash    Has patient had a PCN reaction causing immediate rash, facial/tongue/throat swelling, SOB or lightheadedness with hypotension: Yes Has patient had a PCN reaction causing severe rash involving mucus membranes or skin necrosis: Unknown Has patient had a PCN reaction that required hospitalization: No Has patient had a PCN reaction occurring within the last 10 years: No If all of the above answers are "NO", then may proceed with Cephalosporin use.  . Sulfa Antibiotics Rash    Medications Reviewed Today    Reviewed by De Hollingshead, RPH-CPP (Pharmacist) on 09/27/20 at 7  Med List Status: <None>  Medication Order Taking? Sig Documenting Provider Last Dose Status Informant  aspirin 81 MG EC tablet [id number] Yes TAKE 1 TABLET BY MOUTH DAILY Einar Pheasant, MD Taking Active   cholecalciferol (VITAMIN D3) 25 MCG (1000 UNIT) tablet [id number] Yes Take 1,000 Units by mouth daily. [provider] Taking Active   escitalopram (LEXAPRO) 10 MG tablet [id number] Yes TAKE 1 TABLET BY MOUTH EVERY DAY Einar Pheasant, MD Taking Active  Med Note De Hollingshead   Thu Sep 27, 2020 10:27 AM) Taking 5 mg daily  magnesium oxide (MAG-OX) 400 MG tablet [id number] Yes Take 400 mg by mouth daily. [provider] Taking Active   omeprazole (PRILOSEC) 20 MG capsule [id number] Yes Take 1 capsule (20 mg total) by mouth daily. Einar Pheasant, MD Taking Active   vitamin B-12 (CYANOCOBALAMIN) 1000 MCG tablet [id number] Yes Take 1,000 mcg by mouth daily. [provider] Taking Active           Patient Active Problem List   Diagnosis Date Noted   . Migraine 09/02/2020  . COVID-19 virus infection 03/31/2020  . Racing heart beat 03/25/2020  . Anemia 03/13/2019  . Factor V Leiden mutation ([place]) 11/13/2018  . Gastroesophageal reflux disease 11/13/2018  . Iron deficiency 05/06/2018  . Dizziness 11/03/2017  . History of CVA (cerebrovascular accident) 10/18/2016  . Unresponsive episode 10/17/2016  . History of seizure disorder 10/16/2016  . Health care maintenance 05/02/2015  . Arm skin lesion, left 05/01/2015  . Foot pain, right 02/03/2015  . Pre-op evaluation 02/03/2015  . BMI 40.0-44.9, adult ([place]) 03/26/2014  . Osteoarthrosis, unspecified whether generalized or localized, lower leg 06/09/2012  . Situational anxiety 06/09/2012    Conditions to be addressed/monitored: Anxiety and Obesity, hx CVA  Care Plan : Medication Management  Updates made by De Hollingshead, RPH-CPP since 09/27/2020 12:00 AM    Problem: Anxiety, hx CVA     Long-Range Goal: Disease Progression Prevention   Start Date: 09/27/2020  This Visit's Progress: On track  Priority: High  Note:   Current Barriers:  . Unable to achieve control of weight   Pharmacist Clinical Goal(s):  Marland Kitchen Over the next 90 days, patient will achieve weight loss through collaboration with PharmD and provider.   Interventions: . 1:1 collaboration with Einar Pheasant, MD regarding development and update of comprehensive plan of care as evidenced by provider attestation and co-signature . Inter-disciplinary care team collaboration (see longitudinal plan of care) . Comprehensive medication review performed; medication list updated in electronic medical record  SDOH: . Works at [employer]  Obesity, Impaired Fasting Glucose: Marland Kitchen Unable to achieve goal weight loss through diet and exercise; current treatment: none o Hx metformin - lack of benefit; phentermine - benefit, but regained after use. However, inappropriate choice given hx CVA, hx seizure activity.  . Baseline  weight: 261 lbs  . Current meal patterns: breakfast: Atkins or Premier shake; snack: banana or peanut butter; lunch: hospital cafeteria - [place] sandwich; dinner: [place] skewers w/ salad; hamburger and french fries; convenience; drinks: no sodas, water, unsweet tea w/ supper . Current exercise: three days a week 40-45 minutes, elliptical and weights . Patient would benefit from GLP1 to aid in prevention of progression to diabetes, weight loss. Counseled on GLP1 agonists, including mechanism of action, side effects, and benefits. No personal or family history of medullary thyroid cancer, personal history of pancreatitis or gallbladder disease. Counseled on potential side effects of nausea, stomach upset, queasiness, constipation, and that these generally improve over time. Advised to contact our office with more severe symptoms, including nausea, diarrhea, stomach pain. Patient verbalized understanding. . Completed PA for [facility]. Plan does not provide coverage. Per PCP's referral, completed PA for Ozempic off label. Will follow for results of PA.   Hyperlipidemia: . Uncontrolled given history of CVA; current treatment: none . Medications previously tried: history of atorvastatin 40 mg, but documented in a 04/2017 note from Dr. Manuella Ghazi that the "couldn't  tolerate".  . 10 year ASCVD risk score <1% . Will collaborate w/ PCP to discuss if retrial of statin w/ rosuvastatin 5 mg daily would be appropriate. Goal LDL <70 likely appropriate.   GERD: . Controlled by medication per patient report; current regimen: omeprazole 40 mg daily . Discussed that GLP1 therapy may prolong episodes of GERD. Discussed dietary changes to reduce symptoms, including avoidance of fried, fatty, spicy foods, eating smaller, more frequent meals, and avoiding eating before laying down.   Anxiety: . Well controlled per patient report; current regimen: escitalopram 5 mg daily (taking 1/2 10 mg tab); reports that she experienced more  fatigue with higher dose escitalopram . Acknowledge impact of SSRI on weight management. Benefit likely outweighs risk of this side effect. Recommend to continue current regimen at this time  Supplements:  Marland Kitchen Vitamin D 1000 units, Vitamin B12 1000 mcg   Patient Goals/Self-Care Activities . Over the next 90 days, patient will:  - take medications as prescribed collaborate with provider on medication access solutions target a minimum of 150 minutes of moderate intensity exercise weekly engage in dietary modifications by reducing carbohydrate portion sizes  Follow Up Plan: Telephone follow up appointment with care management team member scheduled for: pending medication access plan       Medication Assistance:  None required.  Patient affirms current coverage meets needs.  Follow Up:  Patient agrees to Care Plan and Follow-up.  Plan: Telephone follow up appointment with care management team member scheduled for:  ~ pending medication access plan  [name], PharmD, [place], [place] Clinical Pharmacist [employer] at [facility] [phone]

## 2020-09-27 NOTE — Patient Instructions (Signed)
Visit Information  PATIENT GOALS:  Goals Addressed              This Visit's Progress     Patient Stated   .  Disease Self Management (pt-stated)        Patient Goals/Self-Care Activities . Over the next 90 days, patient will:  - take medications as prescribed collaborate with provider on medication access solutions target a minimum of 150 minutes of moderate intensity exercise weekly engage in dietary modifications by reducing carbohydrate portion sizes       Gwendolyn Torres was given information about Care Management services by the embedded care coordination team including:  1. Care Management services include personalized support from designated clinical staff supervised by her physician, including individualized plan of care and coordination with other care providers 2. 24/7 contact phone numbers for assistance for urgent and routine care needs. 3. The patient may stop CCM services at any time (effective at the end of the month) by phone call to the office staff.  Patient agreed to services and verbal consent obtained.   Patient verbalizes understanding of instructions provided today and agrees to view in [place].   Plan: Telephone follow up appointment with care management team member scheduled for:  ~ pending medication access plan  [name], PharmD, [place], [place] Clinical Pharmacist [employer] at [facility] [phone]

## 2020-10-08 ENCOUNTER — Ambulatory Visit: Payer: Managed Care, Other (non HMO) | Admitting: Pharmacist

## 2020-10-08 DIAGNOSIS — K219 Gastro-esophageal reflux disease without esophagitis: Secondary | ICD-10-CM

## 2020-10-08 DIAGNOSIS — Z8673 Personal history of transient ischemic attack (TIA), and cerebral infarction without residual deficits: Secondary | ICD-10-CM

## 2020-10-08 DIAGNOSIS — Z6841 Body Mass Index (BMI) 40.0 and over, adult: Secondary | ICD-10-CM

## 2020-10-08 MED ORDER — METFORMIN HCL ER 500 MG PO TB24: 500.0000 mg | ORAL_TABLET | Freq: Two times a day (BID) | ORAL | 1 refills | Status: DC

## 2020-10-08 NOTE — Chronic Care Management (AMB) (Signed)
Care Management   Pharmacy Note  10/08/2020 Name: Gwendolyn Torres MRN: [id number] DOB: 1970/11/13  Subjective: Gwendolyn Torres is a 50 y.o. year old female who is a primary care patient of Einar Pheasant, MD. The Care Management team was consulted for assistance with care management and care coordination needs.    Engaged with patient by telephone for follow up visit in response to provider referral for pharmacy case management and/or care coordination services.   The patient was given information about Care Management services today including:  1. Care Management services includes personalized support from designated clinical staff supervised by the patient's primary care provider, including individualized plan of care and coordination with other care providers. 2. 24/7 contact phone numbers for assistance for urgent and routine care needs. 3. The patient may stop case management services at any time by phone call to the office staff.  Patient agreed to services and consent obtained.  Assessment:  Review of patient status, including review of consultants reports, laboratory and other test data, was performed as part of comprehensive evaluation and provision of chronic care management services.   SDOH (Social Determinants of Health) assessments and interventions performed:    Objective:  Lab Results  Component Value Date   CREATININE 0.95 09/04/2020   CREATININE 0.87 11/22/2019   CREATININE 0.80 06/09/2019    Lab Results  Component Value Date   HGBA1C 5.4 10/17/2016       Component Value Date/Time   CHOL 152 09/04/2020 0738   TRIG 97.0 09/04/2020 0738   HDL 43.80 09/04/2020 0738   CHOLHDL 3 09/04/2020 0738   VLDL 19.4 09/04/2020 0738   LDLCALC 88 09/04/2020 0738     Clinical ASCVD: No  The 10-year ASCVD risk score Mikey Bussing DC Jr., et al., 2013) is: 0.9%   Values used to calculate the score:     Age: 58 years     Sex: Female     Is Non-Hispanic African American: No      Diabetic: No     Tobacco smoker: No     Systolic Blood Pressure: 132 mmHg     Is BP treated: No     HDL Cholesterol: 43.8 mg/dL     Total Cholesterol: 152 mg/dL     BP Readings from Last 3 Encounters:  04/26/20 120/68  02/27/20 124/76  11/22/19 124/78    Care Plan  Allergies  Allergen Reactions  . Penicillins Rash    Has patient had a PCN reaction causing immediate rash, facial/tongue/throat swelling, SOB or lightheadedness with hypotension: Yes Has patient had a PCN reaction causing severe rash involving mucus membranes or skin necrosis: Unknown Has patient had a PCN reaction that required hospitalization: No Has patient had a PCN reaction occurring within the last 10 years: No If all of the above answers are "NO", then may proceed with Cephalosporin use.  . Sulfa Antibiotics Rash    Medications Reviewed Today    Reviewed by De Hollingshead, RPH-CPP (Pharmacist) on 09/27/20 at 32  Med List Status: <None>  Medication Order Taking? Sig Documenting Provider Last Dose Status Informant  aspirin 81 MG EC tablet [id number] Yes TAKE 1 TABLET BY MOUTH DAILY Einar Pheasant, MD Taking Active   cholecalciferol (VITAMIN D3) 25 MCG (1000 UNIT) tablet [id number] Yes Take 1,000 Units by mouth daily. [provider] Taking Active   escitalopram (LEXAPRO) 10 MG tablet [id number] Yes TAKE 1 TABLET BY MOUTH EVERY DAY Einar Pheasant, MD Taking Active  Med Note De Hollingshead   Thu Sep 27, 2020 10:27 AM) Taking 5 mg daily  magnesium oxide (MAG-OX) 400 MG tablet [id number] Yes Take 400 mg by mouth daily. [provider] Taking Active   omeprazole (PRILOSEC) 20 MG capsule [id number] Yes Take 1 capsule (20 mg total) by mouth daily. Einar Pheasant, MD Taking Active   vitamin B-12 (CYANOCOBALAMIN) 1000 MCG tablet [id number] Yes Take 1,000 mcg by mouth daily. [provider] Taking Active           Patient Active Problem List   Diagnosis Date Noted  .  Migraine 09/02/2020  . COVID-19 virus infection 03/31/2020  . Racing heart beat 03/25/2020  . Anemia 03/13/2019  . Factor V Leiden mutation ([place]) 11/13/2018  . Gastroesophageal reflux disease 11/13/2018  . Iron deficiency 05/06/2018  . Dizziness 11/03/2017  . History of CVA (cerebrovascular accident) 10/18/2016  . Unresponsive episode 10/17/2016  . History of seizure disorder 10/16/2016  . Health care maintenance 05/02/2015  . Arm skin lesion, left 05/01/2015  . Foot pain, right 02/03/2015  . Pre-op evaluation 02/03/2015  . BMI 40.0-44.9, adult ([place]) 03/26/2014  . Osteoarthrosis, unspecified whether generalized or localized, lower leg 06/09/2012  . Situational anxiety 06/09/2012    Conditions to be addressed/monitored: Anxiety and hx CVA  Care Plan : Medication Management  Updates made by De Hollingshead, RPH-CPP since 10/08/2020 12:00 AM    Problem: Anxiety, hx CVA     Long-Range Goal: Disease Progression Prevention   Start Date: 09/27/2020  This Visit's Progress: On track  Recent Progress: On track  Priority: High  Note:   Current Barriers:  . Unable to achieve control of weight   Pharmacist Clinical Goal(s):  Marland Kitchen Over the next 90 days, patient will achieve weight loss through collaboration with PharmD and provider.   Interventions: . 1:1 collaboration with Einar Pheasant, MD regarding development and update of comprehensive plan of care as evidenced by provider attestation and co-signature . Inter-disciplinary care team collaboration (see longitudinal plan of care) . Comprehensive medication review performed; medication list updated in electronic medical record  SDOH: . Works at [employer]  Obesity, Impaired Fasting Glucose: Marland Kitchen Unable to achieve goal weight loss through diet and exercise; current treatment: none o Hx metformin - lack of benefit; phentermine - benefit, but regained after use. However, inappropriate choice given hx CVA, hx seizure activity.   . Baseline weight: 261 lbs  . Current meal patterns: breakfast: Atkins or Premier shake; snack: banana or peanut butter; lunch: hospital cafeteria - [place] sandwich; dinner: [place] skewers w/ salad; hamburger and french fries; convenience; drinks: no sodas, water, unsweet tea w/ supper . Current exercise: three days a week 40-45 minutes, elliptical and weights . PA for Ozempic denied as patient does not have a diagnosis of diabetes. Patient verbalized understanding. Discussed metformin, she is amenable to trying. Denied any GI upset when previously on metformin. Start metformin XR 500 mg BID. Discussed taking with food to minimize GI upset. Patient verbalizes understanding.   Hyperlipidemia: . Uncontrolled given history of CVA; current treatment: none . Medications previously tried: history of atorvastatin 40 mg, but documented in a 04/2017 note from Dr. Manuella Ghazi that the "couldn't tolerate".  . 10 year ASCVD risk score <1% . Will collaborate w/ PCP to discuss if retrial of statin w/ rosuvastatin 5 mg daily would be appropriate. Goal LDL <70 likely appropriate. Will discuss with patient at next appointment.   GERD: . Controlled by medication per patient report; current  regimen: omeprazole 40 mg daily . Continue current regimen along with including avoidance of fried, fatty, spicy foods, eating smaller, more frequent meals, and avoiding eating before laying down.   Anxiety: . Well controlled per patient report; current regimen: escitalopram 5 mg daily (taking 1/2 10 mg tab); reports that she experienced more fatigue with higher dose escitalopram . Previously recommend to continue current regimen at this time  Supplements:  Marland Kitchen Vitamin D 1000 units, Vitamin B12 1000 mcg   Patient Goals/Self-Care Activities . Over the next 90 days, patient will:  - take medications as prescribed collaborate with provider on medication access solutions target a minimum of 150 minutes of moderate intensity  exercise weekly engage in dietary modifications by reducing carbohydrate portion sizes  Follow Up Plan: Telephone follow up appointment with care management team member scheduled for: ~ 8 weeks       Medication Assistance:  None required.  Patient affirms current coverage meets needs.  Follow Up:  Patient agrees to Care Plan and Follow-up.  Plan: Telephone follow up appointment with care management team member scheduled for:  ~ 8 weeks  [name], PharmD, [place], [place] Clinical Pharmacist [employer] at [employer] [phone]

## 2020-10-08 NOTE — Patient Instructions (Signed)
Visit Information  Goals Addressed              This Visit's Progress     Patient Stated   .  Disease Self Management (pt-stated)        Patient Goals/Self-Care Activities . Over the next 90 days, patient will:  - take medications as prescribed collaborate with provider on medication access solutions target a minimum of 150 minutes of moderate intensity exercise weekly engage in dietary modifications by reducing carbohydrate portion sizes       Patient verbalizes understanding of instructions provided today and agrees to view in [place].   Plan: Telephone follow up appointment with care management team member scheduled for:  ~ 8 weeks  [name], PharmD, [place], [place] Clinical Pharmacist [employer] at [employer] [phone]

## 2020-10-10 ENCOUNTER — Other Ambulatory Visit: Payer: Self-pay | Admitting: Internal Medicine

## 2020-12-04 ENCOUNTER — Ambulatory Visit: Payer: Managed Care, Other (non HMO) | Admitting: Pharmacist

## 2020-12-04 DIAGNOSIS — R7301 Impaired fasting glucose: Secondary | ICD-10-CM

## 2020-12-04 DIAGNOSIS — Z8673 Personal history of transient ischemic attack (TIA), and cerebral infarction without residual deficits: Secondary | ICD-10-CM

## 2020-12-04 DIAGNOSIS — Z6841 Body Mass Index (BMI) 40.0 and over, adult: Secondary | ICD-10-CM

## 2020-12-04 MED ORDER — TIRZEPATIDE 5 MG/0.5ML ~~LOC~~ SOAJ: 5.0000 mg | SUBCUTANEOUS | 2 refills | Status: DC

## 2020-12-04 MED ORDER — TIRZEPATIDE 2.5 MG/0.5ML ~~LOC~~ SOAJ: 2.5000 mg | SUBCUTANEOUS | 0 refills | Status: AC

## 2020-12-04 NOTE — Chronic Care Management (AMB) (Signed)
Care Management   Pharmacy Note  12/04/2020 Name: Gwendolyn Torres MRN: [id number] DOB: 12-Aug-1970  Subjective: Gwendolyn Torres is a 50 y.o. year old female who is a primary care patient of Einar Pheasant, MD. The Care Management team was consulted for assistance with care management and care coordination needs.    Engaged with patient by telephone for follow up visit in response to provider referral for pharmacy case management and/or care coordination services.   The patient was given information about Care Management services today including:  Care Management services includes personalized support from designated clinical staff supervised by the patient's primary care provider, including individualized plan of care and coordination with other care providers. 24/7 contact phone numbers for assistance for urgent and routine care needs. The patient may stop case management services at any time by phone call to the office staff.  Patient agreed to services and consent obtained.  Assessment:  Review of patient status, including review of consultants reports, laboratory and other test data, was performed as part of comprehensive evaluation and provision of chronic care management services.   SDOH (Social Determinants of Health) assessments and interventions performed:    Objective:  Lab Results  Component Value Date   CREATININE 0.95 09/04/2020   CREATININE 0.87 11/22/2019   CREATININE 0.80 06/09/2019    Lab Results  Component Value Date   HGBA1C 5.4 10/17/2016       Component Value Date/Time   CHOL 152 09/04/2020 0738   TRIG 97.0 09/04/2020 0738   HDL 43.80 09/04/2020 0738   CHOLHDL 3 09/04/2020 0738   VLDL 19.4 09/04/2020 0738   LDLCALC 88 09/04/2020 0738    Clinical ASCVD: Yes  The 10-year ASCVD risk score Mikey Bussing DC Jr., et al., 2013) is: 0.9%   Values used to calculate the score:     Age: 51 years     Sex: Female     Is Non-Hispanic African American: No     Diabetic: No      Tobacco smoker: No     Systolic Blood Pressure: 123456 mmHg     Is BP treated: No     HDL Cholesterol: 43.8 mg/dL     Total Cholesterol: 152 mg/dL      BP Readings from Last 3 Encounters:  04/26/20 120/68  02/27/20 124/76  11/22/19 124/78    Care Plan  Allergies  Allergen Reactions   Penicillins Rash    Has patient had a PCN reaction causing immediate rash, facial/tongue/throat swelling, SOB or lightheadedness with hypotension: Yes Has patient had a PCN reaction causing severe rash involving mucus membranes or skin necrosis: Unknown Has patient had a PCN reaction that required hospitalization: No Has patient had a PCN reaction occurring within the last 10 years: No If all of the above answers are "NO", then may proceed with Cephalosporin use.   Sulfa Antibiotics Rash    Medications Reviewed Today     Reviewed by De Hollingshead, RPH-CPP (Pharmacist) on 12/04/20 at 1149  Med List Status: <None>   Medication Order Taking? Sig Documenting Provider Last Dose Status Informant  aspirin 81 MG EC tablet [id number]  TAKE 1 TABLET BY MOUTH DAILY Einar Pheasant, MD  Active   cholecalciferol (VITAMIN D3) 25 MCG (1000 UNIT) tablet [id number]  Take 1,000 Units by mouth daily. [provider]  Active   escitalopram (LEXAPRO) 10 MG tablet [id number]  TAKE 1 TABLET BY MOUTH EVERY DAY Einar Pheasant, MD  Active   magnesium oxide (MAG-OX) 400  MG tablet [id number]  Take 400 mg by mouth daily. [provider]  Active   omeprazole (PRILOSEC) 20 MG capsule [id number]  Take 1 capsule (20 mg total) by mouth daily. Einar Pheasant, MD  Active   vitamin B-12 (CYANOCOBALAMIN) 1000 MCG tablet [id number]  Take 1,000 mcg by mouth daily. [provider]  Active             Patient Active Problem List   Diagnosis Date Noted   Migraine 09/02/2020   COVID-19 virus infection 03/31/2020   Racing heart beat 03/25/2020   Anemia 03/13/2019   Factor V Leiden mutation ([place])  11/13/2018   Gastroesophageal reflux disease 11/13/2018   Iron deficiency 05/06/2018   Dizziness 11/03/2017   History of CVA (cerebrovascular accident) 10/18/2016   Unresponsive episode 10/17/2016   History of seizure disorder 10/16/2016   Health care maintenance 05/02/2015   Arm skin lesion, left 05/01/2015   Foot pain, right 02/03/2015   Pre-op evaluation 02/03/2015   BMI 40.0-44.9, adult ([place]) 03/26/2014   Osteoarthrosis, unspecified whether generalized or localized, lower leg 06/09/2012   Situational anxiety 06/09/2012    Conditions to be addressed/monitored: Anxiety and Obesity  Care Plan : Medication Management  Updates made by De Hollingshead, RPH-CPP since 12/04/2020 12:00 AM     Problem: Anxiety, hx CVA      Long-Range Goal: Disease Progression Prevention   Start Date: 09/27/2020  This Visit's Progress: On track  Recent Progress: On track  Priority: High  Note:   Current Barriers:  Unable to achieve control of weight   Pharmacist Clinical Goal(s):  Over the next 90 days, patient will achieve weight loss through collaboration with PharmD and provider.   Interventions: 1:1 collaboration with Einar Pheasant, MD regarding development and update of comprehensive plan of care as evidenced by provider attestation and co-signature Inter-disciplinary care team collaboration (see longitudinal plan of care) Comprehensive medication review performed; medication list updated in electronic medical record  SDOH: Works at [employer]  Obesity, Impaired Fasting Glucose, hx impaired insulin resistance: Unable to achieve goal weight loss through diet and exercise; current treatment: metformin 500 mg BID - stopped mid-July due to lack of benefit. Hx metformin - lack of benefit;  Phentermine - benefit, but regained after use. However, inappropriate choice given hx CVA, hx seizure activity.  Insurance did not cover [phone] (no coverage of weight management medications) or  Ozempic (Trulicity, Byetta, Bydureon, metformin preferred per the PA at that time.  Baseline weight: 261 lbs  Reports a history of lab work showing increased insulin resistance. Asks if this would change the determination for her insurance. Reviewed PA history. Darcel Bayley may be a covered option at this time. Completed PA for [facility] with history of impaired glucose tolerance with prior history of metformin. PA approved. Start Mounjaro 2.5 mg weekly, providing sample. Complete 4 weeks of therapy. Script for 5 mg dose sent to her pharmacy to increase to after 4 weeks of 2.5 mg. Counseled on availability of savings card available on manufacturer website.   Hyperlipidemia: Uncontrolled given history of CVA (though possibly related to clotting factor); current treatment: none Medications previously tried: history of atorvastatin 40 mg, but documented in a 04/2017 note from Dr. Manuella Ghazi that the "couldn't tolerate".  10 year ASCVD risk score <1% Discussed with patient. Encouraged to discuss with cardiology, neurology, primary care if retrial of rosuvastatin 5 mg daily would be appropriate given hx CVA.   GERD: Controlled by medication per patient report; current regimen: omeprazole  40 mg daily Continue current regimen along with including avoidance of fried, fatty, spicy foods, eating smaller, more frequent meals, and avoiding eating before laying down.   Anxiety: Well controlled per patient report; current regimen: escitalopram 5 mg daily (taking 1/2 10 mg tab);  Does report weight gain since going on escitalopram. Would like to discontinue. Advised to discontinue, given such low dose. If discontinuation symptoms, can take 5 mg every other day for 2-3 weeks and then discontinue. Advised to contact our office if recurrence of anxiety symptoms.   Supplements:  Vitamin D 1000 units, Vitamin B12 1000 mcg   Patient Goals/Self-Care Activities Over the next 90 days, patient will:  - take medications as  prescribed collaborate with provider on medication access solutions target a minimum of 150 minutes of moderate intensity exercise weekly engage in dietary modifications by reducing carbohydrate portion sizes  Follow Up Plan: Telephone follow up appointment with care management team member scheduled for: ~ 6 weeks       Medication Assistance:  None required.  Patient affirms current coverage meets needs.  Follow Up:  Patient agrees to Care Plan and Follow-up.  Plan: Telephone follow up appointment with care management team member scheduled for:  ~ 6 weeks  [name], PharmD, [employer], CPP Clinical Pharmacist [place] at [facility] [phone]  Medication Samples have been provided to the patient.  Drug name: Darcel Bayley       Strength: 2.5 mg        Qty: 1 box  LOT: [id number]  Exp.Date: 02/05/22

## 2020-12-04 NOTE — Patient Instructions (Signed)
Gwendolyn Torres,   It was great talking to you today!  Start Mounjaro. This medication may cause stomach upset, queasiness, or constipation, especially when first starting. This generally improves over time. Call our office if these symptoms occur and worsen, or if you have severe symptoms such as vomiting, diarrhea, or stomach pain.   Inject 2.5 mg once weekly for 4 weeks. Then, fill the script for the 5 mg strength and inject this once weekly. I scheduled a follow up phone call in about 6 weeks for [place] to review how you are doing and decide if we want to maintain the 5 mg strength or increase to 7.5 mg.   Call me with any questions or concerns!  [name], PharmD [phone]  Visit Information  PATIENT GOALS:  Goals Addressed               This Visit's Progress     Patient Stated     Disease Self Management (pt-stated)        Patient Goals/Self-Care Activities Over the next 90 days, patient will:  - take medications as prescribed collaborate with provider on medication access solutions target a minimum of 150 minutes of moderate intensity exercise weekly engage in dietary modifications by reducing carbohydrate portion sizes         Patient verbalizes understanding of instructions provided today and agrees to view in [place].   Plan: Telephone follow up appointment with care management team member scheduled for:  ~ 6 weeks   [name], PharmD, [place], [place] Clinical Pharmacist [employer] at [employer] [phone]

## 2020-12-17 ENCOUNTER — Other Ambulatory Visit: Payer: Self-pay | Admitting: General Surgery

## 2020-12-17 DIAGNOSIS — R920 Mammographic microcalcification found on diagnostic imaging of breast: Secondary | ICD-10-CM

## 2021-01-15 ENCOUNTER — Ambulatory Visit: Payer: Managed Care, Other (non HMO) | Admitting: Pharmacist

## 2021-01-15 DIAGNOSIS — R7301 Impaired fasting glucose: Secondary | ICD-10-CM

## 2021-01-15 DIAGNOSIS — Z6841 Body Mass Index (BMI) 40.0 and over, adult: Secondary | ICD-10-CM

## 2021-01-15 DIAGNOSIS — Z8673 Personal history of transient ischemic attack (TIA), and cerebral infarction without residual deficits: Secondary | ICD-10-CM

## 2021-01-15 MED ORDER — TIRZEPATIDE 5 MG/0.5ML ~~LOC~~ SOAJ: 5.0000 mg | SUBCUTANEOUS | 2 refills | Status: DC

## 2021-01-15 NOTE — Patient Instructions (Signed)
Gwendolyn Torres,   Continue Mounjaro 5 mg weekly.   Contact Dr. Trena Platt office to talk about your episodes at night. I would also recommend you talk to them about reinitation of statin therapy (like rosuvastatin) due to your history of a stroke.   We recommend you get the influenza vaccine for this season.   We recommend you get the updated bivalent COVID-19 booster, at least 2 months after any prior doses. You can find pharmacies that have this formulation in stock at [email or web address].   Take care!  Gwendolyn Torres, PharmD [phone]   Visit Information   Goals Addressed               This Visit's Progress     Patient Stated     Disease Self Management (pt-stated)        Patient Goals/Self-Care Activities Over the next 90 days, patient will:  - take medications as prescribed collaborate with provider on medication access solutions target a minimum of 150 minutes of moderate intensity exercise weekly engage in dietary modifications by reducing carbohydrate portion sizes        Patient verbalizes understanding of instructions provided today and agrees to view in [place].   Plan: Telephone follow up appointment with care management team member scheduled for:  ~ 6 weeks  Gwendolyn Torres, PharmD, [place], [place] Clinical Pharmacist [employer] at [employer] [phone]

## 2021-01-15 NOTE — Chronic Care Management (AMB) (Addendum)
Care Management   Pharmacy Note  01/15/2021 Name: Gwendolyn Torres MRN: [id number] DOB: [birth year]  Subjective: Gwendolyn Torres is a 50 y.o. year old female who is a primary care patient of Einar Pheasant, MD. The Care Management team was consulted for assistance with care management and care coordination needs.    Engaged with patient by telephone for follow up visit in response to provider referral for pharmacy case management and/or care coordination services.   The patient was given information about Care Management services today including:  Care Management services includes personalized support from designated clinical staff supervised by the patient's primary care provider, including individualized plan of care and coordination with other care providers. 24/7 contact phone numbers for assistance for urgent and routine care needs. The patient may stop case management services at any time by phone call to the office staff.  Patient agreed to services and consent obtained.  Assessment:  Review of patient status, including review of consultants reports, laboratory and other test data, was performed as part of comprehensive evaluation and provision of chronic care management services.   SDOH (Social Determinants of Health) assessments and interventions performed:  SDOH Interventions    Flowsheet Row Most Recent Value  SDOH Interventions   Financial Strain Interventions Intervention Not Indicated        Objective:  Lab Results  Component Value Date   CREATININE 0.95 09/04/2020   CREATININE 0.87 11/22/2019   CREATININE 0.80 06/09/2019    Lab Results  Component Value Date   HGBA1C 5.4 10/17/2016       Component Value Date/Time   CHOL 152 09/04/2020 0738   TRIG 97.0 09/04/2020 0738   HDL 43.80 09/04/2020 0738   CHOLHDL 3 09/04/2020 0738   VLDL 19.4 09/04/2020 0738   LDLCALC 88 09/04/2020 0738      Clinical ASCVD: Yes  - hx ischemic infarction in 2018   BP Readings  from Last 3 Encounters:  04/26/20 120/68  02/27/20 124/76  11/22/19 124/78    Care Plan  Allergies  Allergen Reactions   Penicillins Rash    Has patient had a PCN reaction causing immediate rash, facial/tongue/throat swelling, SOB or lightheadedness with hypotension: Yes Has patient had a PCN reaction causing severe rash involving mucus membranes or skin necrosis: Unknown Has patient had a PCN reaction that required hospitalization: No Has patient had a PCN reaction occurring within the last 10 years: No If all of the above answers are "NO", then may proceed with Cephalosporin use.   Sulfa Antibiotics Rash    Medications Reviewed Today     Reviewed by De Hollingshead, RPH-CPP (Pharmacist) on 12/04/20 at 1149  Med List Status: <None>   Medication Order Taking? Sig Documenting Provider Last Dose Status Informant  aspirin 81 MG EC tablet [id number]  TAKE 1 TABLET BY MOUTH DAILY Einar Pheasant, MD  Active   cholecalciferol (VITAMIN D3) 25 MCG (1000 UNIT) tablet [id number]  Take 1,000 Units by mouth daily. [provider]  Active   escitalopram (LEXAPRO) 10 MG tablet [id number]  TAKE 1 TABLET BY MOUTH EVERY DAY Einar Pheasant, MD  Active   magnesium oxide (MAG-OX) 400 MG tablet [id number]  Take 400 mg by mouth daily. [provider]  Active   omeprazole (PRILOSEC) 20 MG capsule [id number]  Take 1 capsule (20 mg total) by mouth daily. Einar Pheasant, MD  Active   vitamin B-12 (CYANOCOBALAMIN) 1000 MCG tablet [id number]  Take 1,000 mcg by mouth daily. [provider]  Active             Patient Active Problem List   Diagnosis Date Noted   Migraine 09/02/2020   COVID-19 virus infection 03/31/2020   Racing heart beat 03/25/2020   Anemia 03/13/2019   Factor V Leiden mutation ([place]) 11/13/2018   Gastroesophageal reflux disease 11/13/2018   Iron deficiency 05/06/2018   Dizziness 11/03/2017   History of CVA (cerebrovascular accident) 10/18/2016    Unresponsive episode 10/17/2016   History of seizure disorder 10/16/2016   Health care maintenance 05/02/2015   Arm skin lesion, left 05/01/2015   Foot pain, right 02/03/2015   Pre-op evaluation 02/03/2015   BMI 40.0-44.9, adult ([place]) 03/26/2014   Osteoarthrosis, unspecified whether generalized or localized, lower leg 06/09/2012   Situational anxiety 06/09/2012    Conditions to be addressed/monitored:  Obesity, anxiety, hx CVA  Care Plan : Medication Management  Updates made by De Hollingshead, RPH-CPP since 01/15/2021 12:00 AM     Problem: Anxiety, hx CVA      Long-Range Goal: Disease Progression Prevention   Start Date: 09/27/2020  This Visit's Progress: On track  Recent Progress: On track  Priority: High  Note:   Current Barriers:  Unable to achieve control of weight   Pharmacist Clinical Goal(s):  Over the next 90 days, patient will achieve weight loss through collaboration with PharmD and provider.   Interventions: 1:1 collaboration with Einar Pheasant, MD regarding development and update of comprehensive plan of care as evidenced by provider attestation and co-signature Inter-disciplinary care team collaboration (see longitudinal plan of care) Comprehensive medication review performed; medication list updated in electronic medical record  Acute Needs: S/p positive COVID test 9/5.   Obesity, Impaired Fasting Glucose, hx impaired insulin resistance: Unable to achieve goal weight loss through diet and exercise; current treatment: Mounjaro 5 mg (x 2 weeks) Reports she had some queasiness with Mounjaro 2.5 mg. Stepped up to [facility] 5 mg, but tested positive for COVID at the same time and had pretty bad nausea the first week. Improved this week Hx metformin - lack of benefit;  Phentermine - benefit, but regained after use. However, inappropriate choice given hx CVA, hx seizure activity.  Insurance did not cover [phone] (no coverage of weight management medications) or  Ozempic (Trulicity, Byetta, Bydureon, metformin preferred per the PA at that time.  Baseline weight: 275 lbs; most recent weight: 261 lbs Current meal patterns: breakfast: premier/atkins shake; 10 am snack: banana or pepperonis and cheese; lunch: wrap; supper: cooking more, grilled chicken, pork chop, w/ vegetable and salad. Reports the biggest change has been cutting out pre-supper snacking.  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 Encouraged to continue  Requests to continue Mounjaro at 5 mg weekly at this time. Script was sent with refills at last appointment.   Hyperlipidemia, history of CVA  Uncontrolled given history of ischemic infarction (cardioembolic work up negative, through heterozygous for factor V leiden); current treatment: none Medications previously tried: history of atorvastatin 40 mg, but documented in a 04/2017 note from Dr. Manuella Ghazi that the "couldn't tolerate".  10 year ASCVD risk score <1% Discussed with patient. Encouraged to discuss with neurology if retrial of rosuvastatin 5 mg daily would be appropriate given hx CVA, high risk for recurrence (hx impaired fasting glucose, migraine w/ aura)  GERD: Controlled by medication per patient report; current regimen: omeprazole 40 mg daily Continue current  regimen along with including avoidance of fried, fatty, spicy foods, eating smaller, more frequent meals, and avoiding eating before laying down.   Anxiety: Well controlled per patient report; current regimen: recently stopped escitalopram. Notes that since then, she has had more parasthesias at night. Wakes up with some shaking, tingling. Reports she has had these episodes before. Encouraged to contact neurology to discuss. Patient was on low dose of escitalopram 5 mg at baseline and tapered by taking every other day for ~ 2 weeks, so unlikely to be discontinuation  syndrome.   Supplements:  Vitamin D 1000 units, Vitamin B12 1000 mcg   Patient Goals/Self-Care Activities Over the next 90 days, patient will:  - take medications as prescribed collaborate with provider on medication access solutions target a minimum of 150 minutes of moderate intensity exercise weekly engage in dietary modifications by reducing carbohydrate portion sizes  Follow Up Plan: Telephone follow up appointment with care management team member scheduled for: ~ 6 weeks       Medication Assistance:  None required.  Patient affirms current coverage meets needs.  Follow Up:  Patient agrees to Care Plan and Follow-up.  Plan: Telephone follow up appointment with care management team member scheduled for:  ~ 6 weeks  [name], PharmD, [place], [place] Clinical Pharmacist [employer] [facility] [employer] [phone]

## 2021-01-16 ENCOUNTER — Encounter: Payer: Self-pay | Admitting: Internal Medicine

## 2021-01-16 ENCOUNTER — Other Ambulatory Visit: Payer: Self-pay | Admitting: Internal Medicine

## 2021-01-19 NOTE — Telephone Encounter (Signed)
Ok to add on to Wednesday 01/23/21 (12:30).

## 2021-01-21 ENCOUNTER — Ambulatory Visit
Admission: RE | Admit: 2021-01-21 | Discharge: 2021-01-21 | Disposition: A | Discharge status: Home / Self Care | Payer: Managed Care, Other (non HMO) | Source: Ambulatory Visit | Attending: General Surgery | Admitting: General Surgery

## 2021-01-21 ENCOUNTER — Other Ambulatory Visit: Payer: Self-pay

## 2021-01-21 DIAGNOSIS — R920 Mammographic microcalcification found on diagnostic imaging of breast: Secondary | ICD-10-CM

## 2021-01-23 ENCOUNTER — Telehealth (INDEPENDENT_AMBULATORY_CARE_PROVIDER_SITE_OTHER): Payer: Managed Care, Other (non HMO) | Admitting: Internal Medicine

## 2021-01-23 ENCOUNTER — Other Ambulatory Visit: Payer: Self-pay | Admitting: Internal Medicine

## 2021-01-23 DIAGNOSIS — R42 Dizziness and giddiness: Secondary | ICD-10-CM

## 2021-01-23 DIAGNOSIS — E611 Iron deficiency: Secondary | ICD-10-CM | POA: Diagnosis not present

## 2021-01-23 DIAGNOSIS — K219 Gastro-esophageal reflux disease without esophagitis: Secondary | ICD-10-CM | POA: Diagnosis not present

## 2021-01-23 DIAGNOSIS — F419 Anxiety disorder, unspecified: Secondary | ICD-10-CM

## 2021-01-23 DIAGNOSIS — Z8673 Personal history of transient ischemic attack (TIA), and cerebral infarction without residual deficits: Secondary | ICD-10-CM

## 2021-01-23 DIAGNOSIS — G43109 Migraine with aura, not intractable, without status migrainosus: Secondary | ICD-10-CM

## 2021-01-23 DIAGNOSIS — Z6841 Body Mass Index (BMI) 40.0 and over, adult: Secondary | ICD-10-CM

## 2021-01-23 DIAGNOSIS — D6851 Activated protein C resistance: Secondary | ICD-10-CM

## 2021-01-23 DIAGNOSIS — Z8669 Personal history of other diseases of the nervous system and sense organs: Secondary | ICD-10-CM

## 2021-01-23 MED ORDER — ESCITALOPRAM OXALATE 5 MG PO TABS: 5.0000 mg | ORAL_TABLET | Freq: Every day | ORAL | 2 refills | Status: DC

## 2021-01-23 NOTE — Progress Notes (Signed)
Patient ID: Gwendolyn Torres, female   DOB: 12/02/1970, 50 y.o.   MRN: [id number]   Virtual Visit via video Note  This visit type was conducted due to national recommendations for restrictions regarding the COVID-19 pandemic (e.g. social distancing).  This format is felt to be most appropriate for this patient at this time.  All issues noted in this document were discussed and addressed.  No physical exam was performed (except for noted visual exam findings with Video Visits).   I connected with Gwendolyn Torres  by a video enabled telemedicine application and verified that I am speaking with the correct person using two identifiers. Location patient: home Location provider: work  Persons participating in the virtual visit: patient, provider  The limitations, risks, security and privacy concerns of performing an evaluation and management service by video and the availability of in person appointments have been discussed.  It has also been discussed with the patient that there may be a patient responsible charge related to this service. The patient expressed understanding and agreed to proceed.   Reason for visit: work in appt  HPI: She had been off lexapro.  Noticed over the last few weeks, increased anxiety.  Noticing at night - dizziness - like she felt before.  Not exercising.  Increased stress at work.  No chest pain.  Breathing overall stable.   Not a persistent problem.  Some constipation.  No abdominal pain.  On mounjaro.  Some nausea with 5mg .  Overall tolerating ok.  Has lost weight.  Has f/u with neurology tomorrow.     ROS: See pertinent positives and negatives per HPI.  Past Medical History:  Diagnosis Date   GERD (gastroesophageal reflux disease)     Past Surgical History:  Procedure Laterality Date   BUNIONECTOMY Right 09/25/2016   Procedure: lapidus type  modified mcbride;  Surgeon: Albertine Patricia, DPM;  Location: [place];  Service: Podiatry;  Laterality: Right;    CESAREAN SECTION  2004   LOOP RECORDER INSERTION N/A 02/19/2017   Procedure: LOOP RECORDER INSERTION;  Surgeon: Isaias Cowman, MD;  Location: [place] [facility];  Service: Cardiovascular;  Laterality: N/A;   OSTECTOMY Right 09/25/2016   Procedure: OSTECTOMY RT  1SR,2ND 3RD  metatarsal;  Surgeon: Albertine Patricia, DPM;  Location: [place];  Service: Podiatry;  Laterality: Right;  LMA LOCAL   TEE WITHOUT CARDIOVERSION N/A 10/28/2016   Procedure: TRANSESOPHAGEAL ECHOCARDIOGRAM (TEE);  Surgeon: Teodoro Spray, MD;  Location: [facility];  Service: Cardiovascular;  Laterality: N/A;    Family History  Problem Relation Age of Onset   Aortic aneurysm Father        also has popliteal aneurysm   Hypertension Father    Hypercholesterolemia Father    Diabetes Father    Aortic aneurysm Mother    Hypercholesterolemia Mother    Hypercholesterolemia Sister        x2   Hyperlipidemia Brother    Hypertension Sister    Breast cancer Maternal Aunt    Melanoma Maternal Uncle    Colon cancer Neg Hx     SOCIAL HX: reviewed.    Current Outpatient Medications:    aspirin 81 MG EC tablet, TAKE 1 TABLET BY MOUTH DAILY, Disp: 90 tablet, Rfl: 1   cholecalciferol (VITAMIN D3) 25 MCG (1000 UNIT) tablet, Take 1,000 Units by mouth daily., Disp: , Rfl:    escitalopram (LEXAPRO) 5 MG tablet, TAKE 1 TABLET (5 MG TOTAL) BY MOUTH DAILY., Disp: 90 tablet, Rfl: 1  magnesium oxide (MAG-OX) 400 MG tablet, Take 400 mg by mouth daily., Disp: , Rfl:    omeprazole (PRILOSEC) 20 MG capsule, Take 1 capsule (20 mg total) by mouth daily., Disp: 90 capsule, Rfl: 1   tirzepatide (MOUNJARO) 5 MG/0.5ML Pen, Inject 5 mg into the skin once a week., Disp: 2 mL, Rfl: 2   vitamin B-12 (CYANOCOBALAMIN) 1000 MCG tablet, Take 1,000 mcg by mouth daily., Disp: , Rfl:   EXAM:  GENERAL: alert, oriented, appears well and in no acute distress  HEENT: atraumatic, conjunttiva clear, no obvious abnormalities on inspection of  external nose and ears  NECK: normal movements of the head and neck  LUNGS: on inspection no signs of respiratory distress, breathing rate appears normal, no obvious gross SOB, gasping or wheezing  CV: no obvious cyanosis  PSYCH/NEURO: pleasant and cooperative, no obvious depression or anxiety, speech and thought processing grossly intact  ASSESSMENT AND PLAN:  Discussed the following assessment and plan:  Problem List Items Addressed This Visit     Anxiety    Discussed.  Restart lexapro - 5mg  q day.  Follow.        BMI 40.0-44.9, adult ([place])    Started mounjaro.  Discussed diet and exercise.  Follow.       Dizziness    Has previously seen ENT and neurology.  Having return of symptoms as outlined.  Restart lexapro.  Will start 5mg  - better tolerance.  Previous ENT evaluation - BPPV - fluid - ear.  Epley maneuvers.  Keep f/u with neurology tomorrow.        Factor V Leiden mutation [facility])    Evaluated by hematology.  Hetrozygous.        Gastroesophageal reflux disease    No upper symptoms reported.  On prilosec.       History of CVA (cerebrovascular accident)    No acute abnormality noted on MRI. Continue daily aspirin.       History of seizure disorder    Negative EEG.  Off keppra.  Doing well.  Followed by neurology.       Iron deficiency    Follow cbc and iron studies.       Migraine    Continue magnesium.  Seeing neurology in f/u tomorrow.        Return if symptoms worsen or fail to improve, for keep scheduled.   I discussed the assessment and treatment plan with the patient. The patient was provided an opportunity to ask questions and all were answered. The patient agreed with the plan and demonstrated an understanding of the instructions.   The patient was advised to call back or seek an in-person evaluation if the symptoms worsen or if the condition fails to improve as anticipated.    Einar Pheasant, MD

## 2021-01-27 ENCOUNTER — Encounter: Payer: Self-pay | Admitting: Internal Medicine

## 2021-01-27 NOTE — Assessment & Plan Note (Signed)
Has previously seen ENT and neurology.  Having return of symptoms as outlined.  Restart lexapro.  Will start 5mg  - better tolerance.  Previous ENT evaluation - BPPV - fluid - ear.  Epley maneuvers.  Keep f/u with neurology tomorrow.

## 2021-01-27 NOTE — Assessment & Plan Note (Signed)
No upper symptoms reported. On prilosec.  

## 2021-01-27 NOTE — Assessment & Plan Note (Signed)
Continue magnesium.  Seeing neurology in f/u tomorrow.

## 2021-01-27 NOTE — Assessment & Plan Note (Signed)
Follow cbc and iron studies.  

## 2021-01-27 NOTE — Assessment & Plan Note (Signed)
Evaluated by hematology.  Hetrozygous.   

## 2021-01-27 NOTE — Assessment & Plan Note (Signed)
Negative EEG.  Off keppra.  Doing well.  Followed by neurology.  

## 2021-01-27 NOTE — Assessment & Plan Note (Signed)
Discussed.  Restart lexapro - 5mg  q day.  Follow.

## 2021-01-27 NOTE — Assessment & Plan Note (Signed)
No acute abnormality noted on MRI. Continue daily aspirin.

## 2021-01-27 NOTE — Assessment & Plan Note (Signed)
Started mounjaro.  Discussed diet and exercise.  Follow.

## 2021-02-25 ENCOUNTER — Ambulatory Visit: Payer: Managed Care, Other (non HMO) | Admitting: Pharmacist

## 2021-02-25 VITALS — Wt 258.0 lb

## 2021-02-25 DIAGNOSIS — Z8673 Personal history of transient ischemic attack (TIA), and cerebral infarction without residual deficits: Secondary | ICD-10-CM

## 2021-02-25 DIAGNOSIS — Z6841 Body Mass Index (BMI) 40.0 and over, adult: Secondary | ICD-10-CM

## 2021-02-25 DIAGNOSIS — R7301 Impaired fasting glucose: Secondary | ICD-10-CM

## 2021-02-25 MED ORDER — TIRZEPATIDE 7.5 MG/0.5ML ~~LOC~~ SOAJ: 7.5000 mg | SUBCUTANEOUS | 2 refills | Status: DC

## 2021-02-25 NOTE — Chronic Care Management (AMB) (Signed)
Care Management   Pharmacy Note  02/25/2021 Name: Gwendolyn Torres MRN: [id number] DOB: May 16, 1970  Subjective: Gwendolyn Torres is a 50 y.o. year old female who is a primary care patient of Einar Pheasant, MD. The Care Management team was consulted for assistance with care management and care coordination needs.    Engaged with patient by telephone for follow up visit in response to provider referral for pharmacy case management and/or care coordination services.   The patient was given information about Care Management services today including:  Care Management services includes personalized support from designated clinical staff supervised by the patient's primary care provider, including individualized plan of care and coordination with other care providers. 24/7 contact phone numbers for assistance for urgent and routine care needs. The patient may stop case management services at any time by phone call to the office staff.  Patient agreed to services and consent obtained.  Assessment:  Review of patient status, including review of consultants reports, laboratory and other test data, was performed as part of comprehensive evaluation and provision of chronic care management services.   SDOH (Social Determinants of Health) assessments and interventions performed:    Objective:  Lab Results  Component Value Date   CREATININE 0.95 09/04/2020   CREATININE 0.87 11/22/2019   CREATININE 0.80 06/09/2019    Lab Results  Component Value Date   HGBA1C 5.4 10/17/2016       Component Value Date/Time   CHOL 152 09/04/2020 0738   TRIG 97.0 09/04/2020 0738   HDL 43.80 09/04/2020 0738   CHOLHDL 3 09/04/2020 0738   VLDL 19.4 09/04/2020 0738   LDLCALC 88 09/04/2020 0738    BP Readings from Last 3 Encounters:  04/26/20 120/68  02/27/20 124/76  11/22/19 124/78    Care Plan  Allergies  Allergen Reactions   Penicillins Rash    Has patient had a PCN reaction causing immediate rash,  facial/tongue/throat swelling, SOB or lightheadedness with hypotension: Yes Has patient had a PCN reaction causing severe rash involving mucus membranes or skin necrosis: Unknown Has patient had a PCN reaction that required hospitalization: No Has patient had a PCN reaction occurring within the last 10 years: No If all of the above answers are "NO", then may proceed with Cephalosporin use.   Sulfa Antibiotics Rash    Medications Reviewed Today     Reviewed by Einar Pheasant, MD (Physician) on 01/27/21 at 1650  Med List Status: <None>   Medication Order Taking? Sig Documenting Provider Last Dose Status Informant  aspirin 81 MG EC tablet [id number] No TAKE 1 TABLET BY MOUTH DAILY Einar Pheasant, MD Taking Active   cholecalciferol (VITAMIN D3) 25 MCG (1000 UNIT) tablet [id number] No Take 1,000 Units by mouth daily. [provider] Taking Active   escitalopram (LEXAPRO) 5 MG tablet [id number]  TAKE 1 TABLET (5 MG TOTAL) BY MOUTH DAILY. Einar Pheasant, MD  Active   magnesium oxide (MAG-OX) 400 MG tablet [id number] No Take 400 mg by mouth daily. [provider] Taking Active   omeprazole (PRILOSEC) 20 MG capsule [id number] No Take 1 capsule (20 mg total) by mouth daily. Einar Pheasant, MD Taking Active   tirzepatide [facility]) 5 MG/0.5ML Pen [id number]  Inject 5 mg into the skin once a week. Einar Pheasant, MD  Active   vitamin B-12 (CYANOCOBALAMIN) 1000 MCG tablet [id number] No Take 1,000 mcg by mouth daily. [provider] Taking Active             Patient Active Problem  List   Diagnosis Date Noted   Migraine 09/02/2020   COVID-19 virus infection 03/31/2020   Racing heart beat 03/25/2020   Anemia 03/13/2019   Factor V Leiden mutation ([place]) 11/13/2018   Gastroesophageal reflux disease 11/13/2018   Iron deficiency 05/06/2018   Dizziness 11/03/2017   History of CVA (cerebrovascular accident) 10/18/2016   Unresponsive episode 10/17/2016   History of seizure  disorder 10/16/2016   Health care maintenance 05/02/2015   Arm skin lesion, left 05/01/2015   Foot pain, right 02/03/2015   Pre-op evaluation 02/03/2015   BMI 40.0-44.9, adult ([place]) 03/26/2014   Osteoarthrosis, unspecified whether generalized or localized, lower leg 06/09/2012   Anxiety 06/09/2012    Conditions to be addressed/monitored:  Obesity  Care Plan : Medication Management  Updates made by De Hollingshead, RPH-CPP since 02/25/2021 12:00 AM     Problem: Anxiety, hx CVA      Long-Range Goal: Disease Progression Prevention   Start Date: 09/27/2020  Recent Progress: On track  Priority: High  Note:   Current Barriers:  Unable to achieve control of weight   Pharmacist Clinical Goal(s):  Over the next 90 days, patient will achieve weight loss through collaboration with PharmD and provider.   Interventions: 1:1 collaboration with Einar Pheasant, MD regarding development and update of comprehensive plan of care as evidenced by provider attestation and co-signature Inter-disciplinary care team collaboration (see longitudinal plan of care) Comprehensive medication review performed; medication list updated in electronic medical record  Obesity, Impaired Fasting Glucose, hx impaired insulin resistance: Unable to achieve goal weight loss through diet and exercise; current treatment: Mounjaro 5 mg x ~ 2 months, has 1 month remaining in supply Hx metformin - lack of benefit;  Phentermine - benefit, but regained after use. However, inappropriate choice given hx CVA, hx seizure activity.  Insurance did not cover [phone] (no coverage of weight management medications) or Ozempic (Trulicity, Byetta, Bydureon, metformin preferred per the PA at that time.  Baseline weight: 275 lbs; most recent weight: 258 lbs (total weight loss  Current meal patterns: breakfast: premier/atkins shake; 10 am snack: banana or pepperonis and cheese; lunch: wrap; supper: cooking more, grilled chicken, pork  chop, w/ vegetable and salad. Reports the biggest change has been cutting out pre-supper snacking.  Physical activity: plans to get back into the gym, still stress at work at they are waiting on JCAHO. Discussed increasing Mounjaro to 7.5 mg weekly after completion of 5 mg supply, given tolerability. Patient amenable. Complete supply of Mounjaro 5 mg weekly, then increase to 7.5 mg weekly. Patient verbalized understanding.   Hyperlipidemia, history of CVA  Uncontrolled given history of ischemic infarction (cardioembolic work up negative, through heterozygous for factor V leiden); current treatment: none Medications previously tried: history of atorvastatin 40 mg, but documented in a 04/2017 note from Dr. Manuella Ghazi that the "couldn't tolerate".  Strongly encourage trial of rosuvastatin 5 mg daily for secondary prevention of CVA. Patient notes that she did not discuss with neurology at most recent appointment. Encourage discuss w/ PCP at upcoming appointment  GERD: Controlled by medication per patient report; current regimen: omeprazole 40 mg daily Previously recommended to continue current regimen along with including avoidance of fried, fatty, spicy foods, eating smaller, more frequent meals, and avoiding eating before laying down.   Anxiety: Improved; current regimen: escitalopram 5 mg daily - previously stopped due to concerns with weight gain. Felt comfortable restarting with concurrent GLP1/GIP agonist therapy to help mitigate weight gain concerns.  Recommended to continue current  regimen at this time  Supplements:  Vitamin D 1000 units, Vitamin B12 1000 mcg   Patient Goals/Self-Care Activities Over the next 90 days, patient will:  - take medications as prescribed collaborate with provider on medication access solutions target a minimum of 150 minutes of moderate intensity exercise weekly engage in dietary modifications by reducing carbohydrate portion sizes  Follow Up Plan: Telephone  follow up appointment with care management team member scheduled for: ~ 12 weeks     Medication Assistance:  None required.  Patient affirms current coverage meets needs.  Follow Up:  Patient agrees to Care Plan and Follow-up.  Plan: Telephone follow up appointment with care management team member scheduled for:  12 weeks  [name], PharmD, [place], [place] Clinical Pharmacist [employer] at [employer] [phone]

## 2021-02-25 NOTE — Patient Instructions (Signed)
Visit Information   Goals Addressed               This Visit's Progress     Patient Stated     Disease Self Management (pt-stated)        Patient Goals/Self-Care Activities Over the next 90 days, patient will:  - take medications as prescribed collaborate with provider on medication access solutions target a minimum of 150 minutes of moderate intensity exercise weekly engage in dietary modifications by reducing carbohydrate portion sizes         Patient verbalizes understanding of instructions provided today and agrees to view in [place].    Plan: Telephone follow up appointment with care management team member scheduled for:  12 weeks  [name], PharmD, [place], [place] Clinical Pharmacist [employer] at [employer] [phone]

## 2021-04-10 ENCOUNTER — Encounter: Payer: Self-pay | Admitting: Internal Medicine

## 2021-04-10 ENCOUNTER — Other Ambulatory Visit: Payer: Self-pay

## 2021-04-10 ENCOUNTER — Other Ambulatory Visit (HOSPITAL_COMMUNITY)
Admission: RE | Admit: 2021-04-10 | Discharge: 2021-04-10 | Disposition: A | Discharge status: Home / Self Care | Payer: Managed Care, Other (non HMO) | Source: Ambulatory Visit | Attending: Internal Medicine | Admitting: Internal Medicine

## 2021-04-10 ENCOUNTER — Ambulatory Visit: Payer: Managed Care, Other (non HMO) | Admitting: Internal Medicine

## 2021-04-10 VITALS — BP 130/84 | HR 88 | Temp 98.8°F | Resp 16 | Ht 66.25 in | Wt 253.0 lb

## 2021-04-10 DIAGNOSIS — G43109 Migraine with aura, not intractable, without status migrainosus: Secondary | ICD-10-CM

## 2021-04-10 DIAGNOSIS — Z0000 Encounter for general adult medical examination without abnormal findings: Secondary | ICD-10-CM | POA: Diagnosis not present

## 2021-04-10 DIAGNOSIS — F419 Anxiety disorder, unspecified: Secondary | ICD-10-CM

## 2021-04-10 DIAGNOSIS — D649 Anemia, unspecified: Secondary | ICD-10-CM | POA: Diagnosis not present

## 2021-04-10 DIAGNOSIS — Z8673 Personal history of transient ischemic attack (TIA), and cerebral infarction without residual deficits: Secondary | ICD-10-CM

## 2021-04-10 DIAGNOSIS — D6851 Activated protein C resistance: Secondary | ICD-10-CM

## 2021-04-10 DIAGNOSIS — Z124 Encounter for screening for malignant neoplasm of cervix: Secondary | ICD-10-CM

## 2021-04-10 DIAGNOSIS — R928 Other abnormal and inconclusive findings on diagnostic imaging of breast: Secondary | ICD-10-CM

## 2021-04-10 DIAGNOSIS — Z6841 Body Mass Index (BMI) 40.0 and over, adult: Secondary | ICD-10-CM

## 2021-04-10 DIAGNOSIS — R7301 Impaired fasting glucose: Secondary | ICD-10-CM

## 2021-04-10 DIAGNOSIS — E611 Iron deficiency: Secondary | ICD-10-CM | POA: Diagnosis not present

## 2021-04-10 DIAGNOSIS — K219 Gastro-esophageal reflux disease without esophagitis: Secondary | ICD-10-CM

## 2021-04-10 LAB — COMPREHENSIVE METABOLIC PANEL
ALT: 17 U/L (ref 0–35)
AST: 18 U/L (ref 0–37)
Albumin: 4.1 g/dL (ref 3.5–5.2)
Alkaline Phosphatase: 77 U/L (ref 39–117)
BUN: 21 mg/dL (ref 6–23)
CO2: 25 mEq/L (ref 19–32)
Calcium: 9.2 mg/dL (ref 8.4–10.5)
Chloride: 104 mEq/L (ref 96–112)
Creatinine, Ser: 0.93 mg/dL (ref 0.40–1.20)
GFR: 72.12 mL/min (ref >60.00)
Glucose, Bld: 89 mg/dL (ref 70–99)
Potassium: 4.5 mEq/L (ref 3.5–5.1)
Sodium: 137 mEq/L (ref 135–145)
Total Bilirubin: 0.5 mg/dL (ref 0.2–1.2)
Total Protein: 7.1 g/dL (ref 6.0–8.3)

## 2021-04-10 LAB — CBC WITH DIFFERENTIAL/PLATELET
Basophils Absolute: 0.1 10*3/uL (ref 0.0–0.1)
Basophils Relative: 1.2 % (ref 0.0–3.0)
Eosinophils Absolute: 0.1 10*3/uL (ref 0.0–0.7)
Eosinophils Relative: 0.8 % (ref 0.0–5.0)
HCT: 37.3 % (ref 36.0–46.0)
Hemoglobin: 12.4 g/dL (ref 12.0–15.0)
Lymphocytes Relative: 21.7 % (ref 12.0–46.0)
Lymphs Abs: 1.7 10*3/uL (ref 0.7–4.0)
MCHC: 33.3 g/dL (ref 30.0–36.0)
MCV: 82.8 fl (ref 78.0–100.0)
Monocytes Absolute: 0.6 10*3/uL (ref 0.1–1.0)
Monocytes Relative: 8 % (ref 3.0–12.0)
Neutro Abs: 5.3 10*3/uL (ref 1.4–7.7)
Neutrophils Relative %: 68.3 % (ref 43.0–77.0)
Platelets: 302 10*3/uL (ref 150.0–400.0)
RBC: 4.51 Mil/uL (ref 3.87–5.11)
RDW: 15.3 % (ref 11.5–15.5)
WBC: 7.7 10*3/uL (ref 4.0–10.5)

## 2021-04-10 LAB — LIPID PANEL
Cholesterol: 149 mg/dL (ref 0–200)
HDL: 43.9 mg/dL (ref >39.00)
LDL Cholesterol: 88 mg/dL (ref 0–99)
NonHDL: 104.99
Total CHOL/HDL Ratio: 3
Triglycerides: 84 mg/dL (ref 0.0–149.0)
VLDL: 16.8 mg/dL (ref 0.0–40.0)

## 2021-04-10 LAB — FERRITIN: Ferritin: 24.7 ng/mL (ref 10.0–291.0)

## 2021-04-10 LAB — HEMOGLOBIN A1C: Hgb A1c MFr Bld: 5.3 % (ref 4.6–6.5)

## 2021-04-10 NOTE — Progress Notes (Signed)
Subjective:    Patient ID: Gwendolyn Torres, female    DOB: 1971/03/29, 50 y.o.   MRN: [id number]  This visit occurred during the SARS-CoV-2 public health emergency.  Safety protocols were in place, including screening questions prior to the visit, additional usage of staff PPE, and extensive cleaning of exam room while observing appropriate contact time as indicated for disinfecting solutions.   Patient here for her physical exam.   Chief Complaint  Patient presents with   Annual Exam   .   HPI Recently saw cardiology.  Previous TEE and holter monitor - unremarkable.  Linq revealed no significant arrhythmias.  No changes made.  Recommended f/u in 6 months.  No chest pain or sob reported.  Breathing stable.  No increased heart rate or palpitations.  On mounjaro.  Had noticed acid reflux and nausea.  Is better.  Discussed mounjaro.  Discussed possible side effects.  She was scheduled to increase to 7.5.  discussed remaining on 6m dose.  She continues to lose weight.  Is planning to start exercising.  Discussed diet.  No abdominal pain. Constipation - discussed miralax.  Recent abnormal mammogram.  Saw Dr BBary Castilla- recommended f/u bilateral diagnostic mammogram.  Discussed previous parietal stroke and recommendation to start cholesterol medication.     Past Medical History:  Diagnosis Date   GERD (gastroesophageal reflux disease)    Past Surgical History:  Procedure Laterality Date   BUNIONECTOMY Right 09/25/2016   Procedure: lapidus type  modified mcbride;  Surgeon: TAlbertine Patricia DPM;  [place] [facility]  Service: Podiatry;  Laterality: Right;   CESAREAN SECTION  2004   LOOP RECORDER INSERTION N/A 02/19/2017   Procedure: LOOP RECORDER INSERTION;  Surgeon: PIsaias Cowman MD;  [facility];  Service: Cardiovascular;  Laterality: N/A;   OSTECTOMY Right 09/25/2016   Procedure: OSTECTOMY RT  1SR,2ND 3RD  metatarsal;  Surgeon: TAlbertine Patricia DPM;   [place] [facility]  Service: Podiatry;  Laterality: Right;  LMA LOCAL   TEE WITHOUT CARDIOVERSION N/A 10/28/2016   Procedure: TRANSESOPHAGEAL ECHOCARDIOGRAM (TEE);  Surgeon: FTeodoro Spray MD;  Location: [facility];  Service: Cardiovascular;  Laterality: N/A;   Family History  Problem Relation Age of Onset   Aortic aneurysm Father        also has popliteal aneurysm   Hypertension Father    Hypercholesterolemia Father    Diabetes Father    Aortic aneurysm Mother    Hypercholesterolemia Mother    Hypercholesterolemia Sister        x2   Hyperlipidemia Brother    Hypertension Sister    Breast cancer Maternal Aunt    Melanoma Maternal Uncle    Colon cancer Neg Hx    Social History   Socioeconomic History   Marital status: Married    Spouse name: Not on file   Number of children: 1   Years of education: Not on file   Highest education level: Not on file  Occupational History   Occupation: transcriptionist  Tobacco Use   Smoking status: Never   Smokeless tobacco: Never  Vaping Use   Vaping Use: Never used  Substance and Sexual Activity   Alcohol use: No    Alcohol/week: 0.0 standard drinks   Drug use: No   Sexual activity: Yes    Birth control/protection: None  Other Topics Concern   Not on file  Social History Narrative   Not on file   Social Determinants of Health   Financial  Resource Strain: Low Risk    Difficulty of Paying Living Expenses: Not hard at all  Food Insecurity: Not on file  Transportation Needs: Not on file  Physical Activity: Not on file  Stress: Not on file  Social Connections: Not on file     Review of Systems  Constitutional:  Negative for appetite change and unexpected weight change.  HENT:  Negative for congestion, sinus pressure and sore throat.   Eyes:  Negative for pain and visual disturbance.  Respiratory:  Negative for cough, chest tightness and shortness of breath.   Cardiovascular:  Negative for chest pain, palpitations  and leg swelling.  Gastrointestinal:  Positive for constipation. Negative for abdominal pain, diarrhea, nausea and vomiting.  Genitourinary:  Negative for difficulty urinating and dysuria.  Musculoskeletal:  Negative for joint swelling and myalgias.  Skin:  Negative for color change and rash.  Neurological:  Negative for dizziness, light-headedness and headaches.  Hematological:  Negative for adenopathy. Does not bruise/bleed easily.  Psychiatric/Behavioral:  Negative for agitation and dysphoric mood.       Objective:     BP 130/84   Pulse 88   Temp 98.8 F (37.1 C) (Oral)   Resp 16   Ht 5' 6.25" (1.683 m)   Wt 253 lb (114.8 kg)   LMP 03/29/2021   SpO2 96%   BMI 40.53 kg/m  Wt Readings from Last 3 Encounters:  04/10/21 253 lb (114.8 kg)  02/25/21 258 lb (117 kg)  01/23/21 261 lb (118.4 kg)    Physical Exam Vitals reviewed.  Constitutional:      General: She is not in acute distress.    Appearance: Normal appearance. She is well-developed.  HENT:     Head: Normocephalic and atraumatic.     Right Ear: External ear normal.     Left Ear: External ear normal.  Eyes:     General: No scleral icterus.       Right eye: No discharge.        Left eye: No discharge.     Conjunctiva/sclera: Conjunctivae normal.  Neck:     Thyroid: No thyromegaly.  Cardiovascular:     Rate and Rhythm: Normal rate and regular rhythm.  Pulmonary:     Effort: No tachypnea, accessory muscle usage or respiratory distress.     Breath sounds: Normal breath sounds. No decreased breath sounds or wheezing.  Chest:  Breasts:    Right: No inverted nipple, mass, nipple discharge or tenderness (no axillary adenopathy).     Left: No inverted nipple, mass, nipple discharge or tenderness (no axilarry adenopathy).  Abdominal:     General: Bowel sounds are normal.     Palpations: Abdomen is soft.     Tenderness: There is no abdominal tenderness.  Genitourinary:    Comments: Normal external genitalia.   Vaginal vault without lesions.  Cervix identified.  Pap smear performed.  Could not appreciate any adnexal masses or tenderness.   Musculoskeletal:        General: No swelling or tenderness.     Cervical back: Neck supple.  Lymphadenopathy:     Cervical: No cervical adenopathy.  Skin:    Findings: No erythema or rash.  Neurological:     Mental Status: She is alert and oriented to person, place, and time.  Psychiatric:        Mood and Affect: Mood normal.        Behavior: Behavior normal.     Outpatient Encounter Medications as of 04/10/2021  Medication  Sig   aspirin 81 MG EC tablet TAKE 1 TABLET BY MOUTH DAILY   cholecalciferol (VITAMIN D3) 25 MCG (1000 UNIT) tablet Take 1,000 Units by mouth daily.   escitalopram (LEXAPRO) 5 MG tablet TAKE 1 TABLET (5 MG TOTAL) BY MOUTH DAILY.   magnesium oxide (MAG-OX) 400 MG tablet Take 400 mg by mouth daily.   omeprazole (PRILOSEC) 20 MG capsule Take 1 capsule (20 mg total) by mouth daily.   rosuvastatin (CRESTOR) 5 MG tablet Take 1 tablet (5 mg total) by mouth daily.   tirzepatide (MOUNJARO) 7.5 MG/0.5ML Pen Inject 7.5 mg into the skin once a week.   vitamin B-12 (CYANOCOBALAMIN) 1000 MCG tablet Take 1,000 mcg by mouth daily.   No facility-administered encounter medications on file as of 04/10/2021.     Lab Results  Component Value Date   WBC 7.7 04/10/2021   HGB 12.4 04/10/2021   HCT 37.3 04/10/2021   PLT 302.0 04/10/2021   GLUCOSE 89 04/10/2021   CHOL 149 04/10/2021   TRIG 84.0 04/10/2021   HDL 43.90 04/10/2021   LDLCALC 88 04/10/2021   ALT 17 04/10/2021   AST 18 04/10/2021   NA 137 04/10/2021   K 4.5 04/10/2021   CL 104 04/10/2021   CREATININE 0.93 04/10/2021   BUN 21 04/10/2021   CO2 25 04/10/2021   TSH 1.50 09/04/2020   INR 1.04 10/16/2016   HGBA1C 5.3 04/10/2021    MM DIAG BREAST TOMO BILATERAL  Result Date: 01/21/2021 CLINICAL DATA:  50 year old female presenting for 1 year follow-up of calcifications in the right  breast. Biopsy was recommended in September of 2021. The patient and her physician prefer short-term interval follow-up. EXAM: DIGITAL DIAGNOSTIC BILATERAL MAMMOGRAM WITH TOMOSYNTHESIS AND CAD TECHNIQUE: Bilateral digital diagnostic mammography and breast tomosynthesis was performed. The images were evaluated with computer-aided detection. COMPARISON:  Previous exam(s). ACR Breast Density Category b: There are scattered areas of fibroglandular density. FINDINGS: 7 mm grouped pleomorphic calcifications in the slightly lateral and inferior right breast are unchanged. They are indeterminate. No suspicious mass is identified in either breast. No suspicious calcifications identified in the left breast. IMPRESSION: Suspicious pleomorphic calcifications in the right breast. RECOMMENDATION: Stereotactic biopsy of the calcifications in the right breast is recommended. I have discussed the findings and recommendations with the patient. If applicable, a reminder letter will be sent to the patient regarding the next appointment. BI-RADS CATEGORY  4: Suspicious. Electronically Signed   By: Lillia Mountain M.D.   On: 01/21/2021 15:21      Assessment & Plan:   Problem List Items Addressed This Visit     Abnormal mammogram    Mammogram 01/21/21 - [facility].  Saw Dr Bary Castilla.  Recommended f/u bilateral diagnostic mammogram in one year.        Anemia    Check cbc and iron studies.        Anxiety    Continue lexapro.       BMI 40.0-44.9, adult ([place])   Relevant Orders   Lipid panel (Completed)   Hepatic function panel   Factor V Leiden mutation [facility])    Evaluated by hematology.  Hetrozygous.        Gastroesophageal reflux disease    No upper symptoms reported.  On prilosec.       Health care maintenance    Physical today 04/09/21.  PAP 04/09/21.  Mammogram 01/21/21 - [facility] IV.  Saw Dr Bary Castilla.  Recommended f/u bilateral diagnostic mammogram in one year.  Colonoscopy 05/17/20 -  recommended f/u in 10 years.         History of CVA (cerebrovascular accident)    No acute abnormality noted on MRI. Continue daily aspirin. Discussed starting a statin for risk factor modification.  Also discussed possible vascular disease and discussed obtaining CT calcium score.        Iron deficiency    Follow cbc and iron studies.       Relevant Orders   Ferritin (Completed)   CBC with Differential/Platelet (Completed)   Migraine    Saw neurology. Magnesium.  Follow.       Relevant Medications   rosuvastatin (CRESTOR) 5 MG tablet   Other Visit Diagnoses     Routine general medical examination at a health care facility    -  Primary   Impaired fasting glucose       Relevant Orders   HgB A1c (Completed)   Screening for cervical cancer       Relevant Orders   Comp Met (CMET) (Completed)   Cytology - PAP( [place]) (Completed)        Einar Pheasant, MD

## 2021-04-11 MED ORDER — ROSUVASTATIN CALCIUM 5 MG PO TABS: 5.0000 mg | ORAL_TABLET | Freq: Every day | ORAL | 3 refills | Status: DC

## 2021-04-12 LAB — CYTOLOGY - PAP
Comment: NEGATIVE
Diagnosis: NEGATIVE
High risk HPV: NEGATIVE

## 2021-04-13 ENCOUNTER — Encounter: Payer: Self-pay | Admitting: Internal Medicine

## 2021-04-13 DIAGNOSIS — R928 Other abnormal and inconclusive findings on diagnostic imaging of breast: Secondary | ICD-10-CM | POA: Insufficient documentation

## 2021-04-13 NOTE — Assessment & Plan Note (Addendum)
No acute abnormality noted on MRI. Continue daily aspirin. Discussed starting a statin for risk factor modification.  Also discussed possible vascular disease and discussed obtaining CT calcium score.

## 2021-04-13 NOTE — Assessment & Plan Note (Signed)
Follow cbc and iron studies.  

## 2021-04-13 NOTE — Assessment & Plan Note (Signed)
No upper symptoms reported. On prilosec.  

## 2021-04-13 NOTE — Assessment & Plan Note (Signed)
Saw neurology. Magnesium.  Follow.

## 2021-04-13 NOTE — Assessment & Plan Note (Signed)
Evaluated by hematology.  Hetrozygous.   

## 2021-04-13 NOTE — Assessment & Plan Note (Signed)
Mammogram 01/21/21 - [facility] IV.  Saw Dr Bary Castilla.  Recommended f/u bilateral diagnostic mammogram in one year.

## 2021-04-13 NOTE — Assessment & Plan Note (Signed)
Check cbc and iron studies.   

## 2021-04-13 NOTE — Assessment & Plan Note (Addendum)
Physical today 04/09/21.  PAP 04/09/21.  Mammogram 01/21/21 - [facility] IV.  Saw Dr Bary Castilla.  Recommended f/u bilateral diagnostic mammogram in one year.  Colonoscopy 05/17/20 - recommended f/u in 10 years.

## 2021-04-13 NOTE — Assessment & Plan Note (Signed)
Continue lexapro  ?

## 2021-04-19 ENCOUNTER — Other Ambulatory Visit: Payer: Self-pay | Admitting: Internal Medicine

## 2021-04-22 ENCOUNTER — Other Ambulatory Visit: Payer: Self-pay | Admitting: Internal Medicine

## 2021-04-22 DIAGNOSIS — Z8673 Personal history of transient ischemic attack (TIA), and cerebral infarction without residual deficits: Secondary | ICD-10-CM

## 2021-04-22 NOTE — Progress Notes (Signed)
Order placed for CT calcium score.  

## 2021-04-24 ENCOUNTER — Other Ambulatory Visit: Payer: Self-pay

## 2021-04-24 ENCOUNTER — Ambulatory Visit
Admission: RE | Admit: 2021-04-24 | Discharge: 2021-04-24 | Disposition: A | Discharge status: Home / Self Care | Payer: Managed Care, Other (non HMO) | Source: Ambulatory Visit | Attending: Internal Medicine | Admitting: Internal Medicine

## 2021-04-24 DIAGNOSIS — Z8673 Personal history of transient ischemic attack (TIA), and cerebral infarction without residual deficits: Secondary | ICD-10-CM | POA: Insufficient documentation

## 2021-05-16 ENCOUNTER — Encounter: Payer: Self-pay | Admitting: Internal Medicine

## 2021-05-23 ENCOUNTER — Other Ambulatory Visit: Payer: Managed Care, Other (non HMO)

## 2021-05-29 ENCOUNTER — Ambulatory Visit: Payer: Managed Care, Other (non HMO) | Admitting: Pharmacist

## 2021-05-29 VITALS — Wt 249.0 lb

## 2021-05-29 DIAGNOSIS — Z8673 Personal history of transient ischemic attack (TIA), and cerebral infarction without residual deficits: Secondary | ICD-10-CM

## 2021-05-29 DIAGNOSIS — R7301 Impaired fasting glucose: Secondary | ICD-10-CM

## 2021-05-29 DIAGNOSIS — Z6841 Body Mass Index (BMI) 40.0 and over, adult: Secondary | ICD-10-CM

## 2021-05-29 DIAGNOSIS — Z6839 Body mass index (BMI) 39.0-39.9, adult: Secondary | ICD-10-CM

## 2021-05-29 MED ORDER — TIRZEPATIDE 7.5 MG/0.5ML ~~LOC~~ SOAJ: 7.5000 mg | SUBCUTANEOUS | 2 refills | Status: DC

## 2021-05-29 NOTE — Patient Instructions (Signed)
Rodena Piety,   Increase Mounjaro to 7.5 mg weekly. Let me know if you have any questions or concerns!  [name], PharmD

## 2021-05-29 NOTE — Progress Notes (Signed)
Chief Complaint  Patient presents with   Care Coordination    Follow Up    Gwendolyn Torres is a 51 y.o. year old female who was referred for medication management by their primary care provider, Einar Pheasant, MD. They presented for a telephone visit in the context of the COVID-19 pandemic.   Subjective: Hyperlipidemia/ASCVD Risk Reduction  Current lipid lowering medications: rosuvastatin 5 mg daily Medications tried in the past: atorvastatin 40 mg daily (s/p CVA); reported lower leg muscle aches  Does report some hip and elbow aches, but not constantly. Not bad enough to discontinue therapy. Has LFTs scheduled ~ 6 weeks after starting. Had CAC score done, was 0 , but decision made to start statin therapy for secondary prevention.   Antiplatelet regimen: aspirin 81 mg daily  ASCVD History: hx CVA   Obesity/Overweight, Complicated by hyperlipidemia, OA, GERD:  Current medications: Mounjaro 5 mg weekly - has not stepped up to 7.5 mg - attempted approval of Wegovy on insurance in 2021, weight management agents were not covered.   Weight Management treatments previously prescribed: stimulant agents are not appropriate given hx CVA  Reports some queasiness the day after the injection, and this is stable. Interested in increasing dose to see if greater impact on weight Objective: Lab Results  Component Value Date   HGBA1C 5.3 04/10/2021    Lab Results  Component Value Date   CREATININE 0.93 04/10/2021   BUN 21 04/10/2021   NA 137 04/10/2021   K 4.5 04/10/2021   CL 104 04/10/2021   CO2 25 04/10/2021    Lab Results  Component Value Date   CHOL 149 04/10/2021   HDL 43.90 04/10/2021   LDLCALC 88 04/10/2021   TRIG 84.0 04/10/2021   CHOLHDL 3 04/10/2021   Vitals:   05/29/21 1126  Weight: 249 lb (112.9 kg)    Medications Reviewed Today     Reviewed by De Hollingshead, RPH-CPP (Pharmacist) on 05/29/21 at 1128  Med List Status: <None>   Medication Order  Taking? Sig Documenting Provider Last Dose Status Informant  aspirin 81 MG EC tablet [id number] Yes TAKE 1 TABLET BY MOUTH DAILY Einar Pheasant, MD Taking Active   cholecalciferol (VITAMIN D3) 25 MCG (1000 UNIT) tablet [id number] Yes Take 1,000 Units by mouth daily. [provider] Taking Active   escitalopram (LEXAPRO) 5 MG tablet [id number] Yes TAKE 1 TABLET (5 MG TOTAL) BY MOUTH DAILY. Einar Pheasant, MD Taking Active   magnesium oxide (MAG-OX) 400 MG tablet [id number]  Take 400 mg by mouth daily. [provider]  Active   omeprazole (PRILOSEC) 20 MG capsule [id number] Yes Take 1 capsule (20 mg total) by mouth daily. Einar Pheasant, MD Taking Active   rosuvastatin (CRESTOR) 5 MG tablet [id number] Yes Take 1 tablet (5 mg total) by mouth daily. Einar Pheasant, MD Taking Active   tirzepatide [facility]) 5 MG/0.5ML Pen [id number] Yes Inject 5 mg into the skin once a week. [provider] Taking Active   tirzepatide [facility]) 7.5 MG/0.5ML Pen [id number] No Inject 7.5 mg into the skin once a week.  Patient not taking: Reported on 05/29/2021   Einar Pheasant, MD Not Taking Active   vitamin B-12 (CYANOCOBALAMIN) 1000 MCG tablet [id number] Yes Take 1,000 mcg by mouth daily. [provider] Taking Active             Assessment/Plan:   Hyperlipidemia/ASCVD Risk Reduction: - Currently uncontrolled given history of CVA - Reviewed long term complications of  uncontrolled cholesterol - Recommend to continue rosuvastatin 5 mg daily. If continued aches, could consider reducing to every other day administration. If unable to tolerate, recommend PCSK9i given hx ASCVD and intolerance of 2 statins   Obesity/Overweight: - Currently unable to achieve goal weight loss of 5-10% through diet and lifestyle modifications alone - Recommend to increase Mounjaro to 7.5 mg weekly. Advised on risk of GI upset with higher dose and to contact us if significant, results in nausea, or does  not improve with continued dosing.   Follow Up Plan: video visit in 6 weeks  [name], PharmD, [place], CPP Clinical Pharmacist [employer] at [employer] [phone]

## 2021-06-26 ENCOUNTER — Other Ambulatory Visit (INDEPENDENT_AMBULATORY_CARE_PROVIDER_SITE_OTHER): Payer: Managed Care, Other (non HMO)

## 2021-06-26 ENCOUNTER — Other Ambulatory Visit: Payer: Self-pay

## 2021-06-26 DIAGNOSIS — Z6841 Body Mass Index (BMI) 40.0 and over, adult: Secondary | ICD-10-CM | POA: Diagnosis not present

## 2021-06-26 DIAGNOSIS — E669 Obesity, unspecified: Secondary | ICD-10-CM

## 2021-06-26 LAB — HEPATIC FUNCTION PANEL
ALT: 22 U/L (ref 0–35)
AST: 22 U/L (ref 0–37)
Albumin: 4.2 g/dL (ref 3.5–5.2)
Alkaline Phosphatase: 71 U/L (ref 39–117)
Bilirubin, Direct: 0.1 mg/dL (ref 0.0–0.3)
Total Bilirubin: 0.5 mg/dL (ref 0.2–1.2)
Total Protein: 6.9 g/dL (ref 6.0–8.3)

## 2021-07-08 ENCOUNTER — Telehealth: Payer: Managed Care, Other (non HMO) | Admitting: Pharmacist

## 2021-07-08 VITALS — Wt 245.0 lb

## 2021-07-08 DIAGNOSIS — Z8673 Personal history of transient ischemic attack (TIA), and cerebral infarction without residual deficits: Secondary | ICD-10-CM

## 2021-07-08 NOTE — Progress Notes (Signed)
? ?   ? ?Chief Complaint  ?Patient presents with  ? Diabetes, Hypercholesterolemia  ? ? ?Gwendolyn Torres is a 51 y.o. year old female who was referred for medication management by their primary care provider, Einar Pheasant, MD. They presented for a virtual visit in the context of the COVID-19 pandemic. Unable to connect via video, changed to telephone call.  ? ?Subjective: ?Hyperlipidemia/ASCVD Risk Reduction ? ?Current lipid lowering medications: rosuvastatin 5 mg daily - reports she is tolerating well; LFTs normal on recent labs ?Medications tried in the past: atorvastatin - muscle aches ? ?Antiplatelet regimen: aspirin 81 mg daily ? ? ?Obesity/Overweight, Complicated by Hyperlipidemia: ? ?Current medications: Mounjaro 7.5 mg weekly x 2 weeks ?- attempted approval of Wegovy on insurance in 2021, weight management agents were not covered.  ? ?Baseline weight: 261 lbs; most recent weight: 245 lbs; total weight lost: 16 lbs (6% from baseline) ?Current meal patterns:  ?- Breakfast: proteins shake; handful of nuts later ?- Lunch grilled chicken, salad ?- Supper: meat + vegetable; weekends are different (goes out to eat) ?- Snacks: nuts ?- Drinks: water ? ?Current physical activity: [employer] 3 days a week ? ?Current medication access support: none needed at this time ?  ? ?Objective: ?Lab Results  ?Component Value Date  ? HGBA1C 5.3 04/10/2021  ? ? ?Lab Results  ?Component Value Date  ? CREATININE 0.93 04/10/2021  ? BUN 21 04/10/2021  ? NA 137 04/10/2021  ? K 4.5 04/10/2021  ? CL 104 04/10/2021  ? CO2 25 04/10/2021  ? ? ?Lab Results  ?Component Value Date  ? CHOL 149 04/10/2021  ? HDL 43.90 04/10/2021  ? [place] 88 04/10/2021  ? TRIG 84.0 04/10/2021  ? CHOLHDL 3 04/10/2021  ? ? ?Medications Reviewed Today   ? ? Reviewed by De Hollingshead, RPH-CPP (Pharmacist) on 07/08/21 at 1604  Med List Status: <None>  ? ?Medication Order Taking? Sig Documenting Provider Last Dose Status Informant  ?aspirin 81 MG EC tablet  [id number] Yes TAKE 1 TABLET BY MOUTH DAILY Einar Pheasant, MD Taking Active   ?cholecalciferol (VITAMIN D3) 25 MCG (1000 UNIT) tablet [id number] Yes Take 1,000 Units by mouth daily. [provider] Taking Active   ?escitalopram (LEXAPRO) 5 MG tablet [id number] Yes TAKE 1 TABLET (5 MG TOTAL) BY MOUTH DAILY. Einar Pheasant, MD Taking Active   ?magnesium oxide (MAG-OX) 400 MG tablet [id number] Yes Take 400 mg by mouth daily. [provider] Taking Active   ?omeprazole (PRILOSEC) 20 MG capsule [id number] Yes Take 1 capsule (20 mg total) by mouth daily. Einar Pheasant, MD Taking Active   ?rosuvastatin (CRESTOR) 5 MG tablet [id number] Yes Take 1 tablet (5 mg total) by mouth daily. Einar Pheasant, MD Taking Active   ?tirzepatide [facility]) 7.5 MG/0.5ML Pen [id number] Yes Inject 7.5 mg into the skin once a week. Einar Pheasant, MD Taking Active   ?vitamin B-12 (CYANOCOBALAMIN) 1000 MCG tablet [id number] Yes Take 1,000 mcg by mouth daily. [provider] Taking Active   ? ?  ?  ? ?  ? ? ?Assessment/Plan:  ? ?Hyperlipidemia/ASCVD Risk Reduction: ?- Currently appropriately managed ?- Check lipids with next lab appointment. Continue current regimen at this time for secondary stroke prevention ? ?Obesity: ?- Currently unable to achieve goal weight loss of 5-10% through diet and lifestyle modifications alone ?- Extensive dietary counseling including education on focus on lean proteins, fruits and vegetables, whole grains and increased fiber consumption, adequate hydration ?- Extensive exercise counseling including eventual goal  of 150 minutes of moderate intensity exercise weekly. Praised for increasing physical activity.  ?- Continue current regimen at this time. Patient will let us know if she would like to step up to 10 mg dose when she finishes current supply. Otherwise, continue current regimen and follow up with PCP as scheduled.  ? ?Follow Up Plan: follow up with PCP ? ?[name], PharmD, BCACP,  CPP ?Clinical Pharmacist ?Therapist, music at [employer] ?[phone] ? ? ? ?

## 2021-07-08 NOTE — Patient Instructions (Signed)
Kyliyah,  ? ?Keep up the great work! ? ?Let Dr. Nicki Reaper know if you would like to step up the Mounjaro dose to 10 mg when you are finishing up your supply of 7.5 mg.  ? ?Thanks! ? ?[name], PharmD ?

## 2021-08-26 ENCOUNTER — Other Ambulatory Visit: Payer: Self-pay | Admitting: Internal Medicine

## 2021-08-26 DIAGNOSIS — R7301 Impaired fasting glucose: Secondary | ICD-10-CM

## 2021-08-26 DIAGNOSIS — Z6841 Body Mass Index (BMI) 40.0 and over, adult: Secondary | ICD-10-CM

## 2021-10-03 ENCOUNTER — Emergency Department
Admission: EM | Admit: 2021-10-03 | Discharge: 2021-10-03 | Discharge status: Against Medical Advice | Payer: Managed Care, Other (non HMO) | Attending: Emergency Medicine | Admitting: Emergency Medicine

## 2021-10-03 ENCOUNTER — Encounter: Payer: Self-pay | Admitting: Internal Medicine

## 2021-10-03 ENCOUNTER — Other Ambulatory Visit: Payer: Self-pay

## 2021-10-03 DIAGNOSIS — Z5321 Procedure and treatment not carried out due to patient leaving prior to being seen by health care provider: Secondary | ICD-10-CM | POA: Insufficient documentation

## 2021-10-03 DIAGNOSIS — E538 Deficiency of other specified B group vitamins: Secondary | ICD-10-CM

## 2021-10-03 DIAGNOSIS — T7840XA Allergy, unspecified, initial encounter: Secondary | ICD-10-CM | POA: Diagnosis not present

## 2021-10-03 DIAGNOSIS — Z8673 Personal history of transient ischemic attack (TIA), and cerebral infarction without residual deficits: Secondary | ICD-10-CM

## 2021-10-03 DIAGNOSIS — E611 Iron deficiency: Secondary | ICD-10-CM

## 2021-10-03 DIAGNOSIS — R5383 Other fatigue: Secondary | ICD-10-CM

## 2021-10-03 DIAGNOSIS — E559 Vitamin D deficiency, unspecified: Secondary | ICD-10-CM

## 2021-10-03 NOTE — ED Triage Notes (Signed)
Pt presents via POV c/o allergic reaction after eating dinner at apx 0830. Reports no know food allergies but did eat shrimp tonight but no previous reactions while eating shrimp. PO Benadryl taken by pt PTA. Pt speaking in full sentences. In no acute distress. Redness noted to left arm and face.

## 2021-10-05 DIAGNOSIS — E559 Vitamin D deficiency, unspecified: Secondary | ICD-10-CM | POA: Insufficient documentation

## 2021-10-05 DIAGNOSIS — E538 Deficiency of other specified B group vitamins: Secondary | ICD-10-CM | POA: Insufficient documentation

## 2021-10-05 NOTE — Telephone Encounter (Signed)
Orders placed for labs

## 2021-10-08 ENCOUNTER — Other Ambulatory Visit (INDEPENDENT_AMBULATORY_CARE_PROVIDER_SITE_OTHER): Payer: Managed Care, Other (non HMO)

## 2021-10-08 DIAGNOSIS — E559 Vitamin D deficiency, unspecified: Secondary | ICD-10-CM

## 2021-10-08 DIAGNOSIS — E611 Iron deficiency: Secondary | ICD-10-CM | POA: Diagnosis not present

## 2021-10-08 DIAGNOSIS — E538 Deficiency of other specified B group vitamins: Secondary | ICD-10-CM

## 2021-10-08 DIAGNOSIS — R5383 Other fatigue: Secondary | ICD-10-CM | POA: Diagnosis not present

## 2021-10-08 DIAGNOSIS — Z8673 Personal history of transient ischemic attack (TIA), and cerebral infarction without residual deficits: Secondary | ICD-10-CM

## 2021-10-08 LAB — CBC WITH DIFFERENTIAL/PLATELET
Basophils Absolute: 0.1 10*3/uL (ref 0.0–0.1)
Basophils Relative: 0.8 % (ref 0.0–3.0)
Eosinophils Absolute: 0.1 10*3/uL (ref 0.0–0.7)
Eosinophils Relative: 2.4 % (ref 0.0–5.0)
HCT: 36.7 % (ref 36.0–46.0)
Hemoglobin: 12.1 g/dL (ref 12.0–15.0)
Lymphocytes Relative: 25.6 % (ref 12.0–46.0)
Lymphs Abs: 1.6 10*3/uL (ref 0.7–4.0)
MCHC: 32.9 g/dL (ref 30.0–36.0)
MCV: 81.8 fl (ref 78.0–100.0)
Monocytes Absolute: 0.6 10*3/uL (ref 0.1–1.0)
Monocytes Relative: 9.8 % (ref 3.0–12.0)
Neutro Abs: 3.9 10*3/uL (ref 1.4–7.7)
Neutrophils Relative %: 61.4 % (ref 43.0–77.0)
Platelets: 290 10*3/uL (ref 150.0–400.0)
RBC: 4.48 Mil/uL (ref 3.87–5.11)
RDW: 15.7 % — ABNORMAL HIGH (ref 11.5–15.5)
WBC: 6.3 10*3/uL (ref 4.0–10.5)

## 2021-10-08 LAB — IBC + FERRITIN
Ferritin: 12.5 ng/mL (ref 10.0–291.0)
Iron: 53 ug/dL (ref 42–145)
Saturation Ratios: 14.4 % — ABNORMAL LOW (ref 20.0–50.0)
TIBC: 366.8 ug/dL (ref 250.0–450.0)
Transferrin: 262 mg/dL (ref 212.0–360.0)

## 2021-10-08 LAB — COMPREHENSIVE METABOLIC PANEL
ALT: 34 U/L (ref 0–35)
AST: 29 U/L (ref 0–37)
Albumin: 3.9 g/dL (ref 3.5–5.2)
Alkaline Phosphatase: 69 U/L (ref 39–117)
BUN: 17 mg/dL (ref 6–23)
CO2: 27 mEq/L (ref 19–32)
Calcium: 9.1 mg/dL (ref 8.4–10.5)
Chloride: 104 mEq/L (ref 96–112)
Creatinine, Ser: 0.85 mg/dL (ref 0.40–1.20)
GFR: 80.06 mL/min (ref >60.00)
Glucose, Bld: 85 mg/dL (ref 70–99)
Potassium: 4.3 mEq/L (ref 3.5–5.1)
Sodium: 138 mEq/L (ref 135–145)
Total Bilirubin: 0.5 mg/dL (ref 0.2–1.2)
Total Protein: 7 g/dL (ref 6.0–8.3)

## 2021-10-08 LAB — VITAMIN B12: Vitamin B-12: 681 pg/mL (ref 211–911)

## 2021-10-08 LAB — LIPID PANEL
Cholesterol: 137 mg/dL (ref 0–200)
HDL: 46.2 mg/dL (ref >39.00)
LDL Cholesterol: 73 mg/dL (ref 0–99)
NonHDL: 90.5
Total CHOL/HDL Ratio: 3
Triglycerides: 89 mg/dL (ref 0.0–149.0)
VLDL: 17.8 mg/dL (ref 0.0–40.0)

## 2021-10-08 LAB — TSH: TSH: 1.76 u[IU]/mL (ref 0.35–5.50)

## 2021-10-08 LAB — VITAMIN D 25 HYDROXY (VIT D DEFICIENCY, FRACTURES): VITD: 22.11 ng/mL — ABNORMAL LOW (ref 30.00–100.00)

## 2021-10-10 ENCOUNTER — Encounter: Payer: Self-pay | Admitting: Internal Medicine

## 2021-10-10 ENCOUNTER — Ambulatory Visit: Payer: Managed Care, Other (non HMO) | Admitting: Internal Medicine

## 2021-10-10 DIAGNOSIS — Z8673 Personal history of transient ischemic attack (TIA), and cerebral infarction without residual deficits: Secondary | ICD-10-CM

## 2021-10-10 DIAGNOSIS — E611 Iron deficiency: Secondary | ICD-10-CM

## 2021-10-10 DIAGNOSIS — D649 Anemia, unspecified: Secondary | ICD-10-CM

## 2021-10-10 DIAGNOSIS — K219 Gastro-esophageal reflux disease without esophagitis: Secondary | ICD-10-CM | POA: Diagnosis not present

## 2021-10-10 DIAGNOSIS — D6851 Activated protein C resistance: Secondary | ICD-10-CM | POA: Diagnosis not present

## 2021-10-10 DIAGNOSIS — R928 Other abnormal and inconclusive findings on diagnostic imaging of breast: Secondary | ICD-10-CM | POA: Diagnosis not present

## 2021-10-10 DIAGNOSIS — G43109 Migraine with aura, not intractable, without status migrainosus: Secondary | ICD-10-CM | POA: Diagnosis not present

## 2021-10-10 DIAGNOSIS — F419 Anxiety disorder, unspecified: Secondary | ICD-10-CM

## 2021-10-10 DIAGNOSIS — E559 Vitamin D deficiency, unspecified: Secondary | ICD-10-CM

## 2021-10-10 DIAGNOSIS — T7840XA Allergy, unspecified, initial encounter: Secondary | ICD-10-CM

## 2021-10-10 MED ORDER — EPINEPHRINE 0.3 MG/0.3ML IJ SOAJ: 0.3000 mg | INTRAMUSCULAR | 0 refills | Status: DC | PRN

## 2021-10-10 NOTE — Progress Notes (Unsigned)
Patient ID: Gwendolyn Torres, female   DOB: 1970/08/06, 51 y.o.   MRN: [id number]   Subjective:    Patient ID: Gwendolyn Torres, female    DOB: 02-Feb-1971, 51 y.o.   MRN: [id number]   Patient here for a scheduled follow up.    HPI Here to follow up regarding increased stress.  Also reports increased fatigue. Recently evaluated - acute care - sinusitis and preseptal cellulitis.  Treated with abx eye drops and doxycycline.  Resolved.  Ate shrimp recently.  Noticed after eating, increased rash, tingling, eyes swollen.  Took benadryl.  Went to ER but was not evaluated.  Resolved.  No problems since. On crestor.  Previous CAC score - 0.  On aspirin.  History of CVA.  Reports some fatigue.  No chest pain or sob reported.  No abdominal pain or bowel change reported.  Handling stress.  Dr Bary Castilla following for abnormal mammogram.  Due bilateral diagnostic in 01/2022.  On mounjaro.  Taking 7.'5mg'$  q week.  Increase nausea for 3 days after taking.  Has curbed her appetite.  Desires to remain on this dose.     Past Medical History:  Diagnosis Date   GERD (gastroesophageal reflux disease)    Past Surgical History:  Procedure Laterality Date   BUNIONECTOMY Right 09/25/2016   Procedure: lapidus type  modified mcbride;  Surgeon: Albertine Patricia, DPM;  Location: [place];  Service: Podiatry;  Laterality: Right;   CESAREAN SECTION  2004   LOOP RECORDER INSERTION N/A 02/19/2017   Procedure: LOOP RECORDER INSERTION;  Surgeon: Isaias Cowman, MD;  Location: [place] [facility];  Service: Cardiovascular;  Laterality: N/A;   OSTECTOMY Right 09/25/2016   Procedure: OSTECTOMY RT  1SR,2ND 3RD  metatarsal;  Surgeon: Albertine Patricia, DPM;  Location: [place];  Service: Podiatry;  Laterality: Right;  LMA LOCAL   TEE WITHOUT CARDIOVERSION N/A 10/28/2016   Procedure: TRANSESOPHAGEAL ECHOCARDIOGRAM (TEE);  Surgeon: Teodoro Spray, MD;  Location: [facility];  Service: Cardiovascular;  Laterality: N/A;    Family History  Problem Relation Age of Onset   Aortic aneurysm Father        also has popliteal aneurysm   Hypertension Father    Hypercholesterolemia Father    Diabetes Father    Aortic aneurysm Mother    Hypercholesterolemia Mother    Hypercholesterolemia Sister        x2   Hyperlipidemia Brother    Hypertension Sister    Breast cancer Maternal Aunt    Melanoma Maternal Uncle    Colon cancer Neg Hx    Social History   Socioeconomic History   Marital status: Married    Spouse name: Not on file   Number of children: 1   Years of education: Not on file   Highest education level: Not on file  Occupational History   Occupation: transcriptionist  Tobacco Use   Smoking status: Never   Smokeless tobacco: Never  Vaping Use   Vaping Use: Never used  Substance and Sexual Activity   Alcohol use: No    Alcohol/week: 0.0 standard drinks of alcohol   Drug use: No   Sexual activity: Yes    Birth control/protection: None  Other Topics Concern   Not on file  Social History Narrative   Not on file   Social Determinants of Health   Financial Resource Strain: Low Risk  (01/15/2021)   Overall Financial Resource Strain (CARDIA)    Difficulty of Paying Living Expenses: Not hard at all  Food Insecurity: Not on file  Transportation Needs: Not on file  Physical Activity: Not on file  Stress: Not on file  Social Connections: Not on file     Review of Systems  Constitutional:  Positive for fatigue. Negative for appetite change and unexpected weight change.  HENT:  Negative for congestion and sinus pressure.   Respiratory:  Negative for cough, chest tightness and shortness of breath.   Cardiovascular:  Negative for chest pain, palpitations and leg swelling.  Gastrointestinal:  Negative for abdominal pain, diarrhea, nausea and vomiting.  Genitourinary:  Negative for difficulty urinating and dysuria.  Musculoskeletal:  Negative for joint swelling and myalgias.  Skin:  Negative  for color change and rash.  Neurological:  Negative for dizziness, light-headedness and headaches.  Psychiatric/Behavioral:  Negative for agitation and dysphoric mood.        Objective:     BP 128/80 (BP Location: Left Arm, Patient Position: Sitting, Cuff Size: Large)   Pulse 72   Temp 98.4 F (36.9 C) (Temporal)   Resp 16   Ht '5\' 7"'$  (1.702 m)   Wt 243 lb 12.8 oz (110.6 kg)   SpO2 98%   BMI 38.18 kg/m  Wt Readings from Last 3 Encounters:  10/10/21 243 lb 12.8 oz (110.6 kg)  10/03/21 240 lb (108.9 kg)  07/08/21 245 lb (111.1 kg)    Physical Exam Vitals reviewed.  Constitutional:      General: She is not in acute distress.    Appearance: Normal appearance.  HENT:     Head: Normocephalic and atraumatic.     Right Ear: External ear normal.     Left Ear: External ear normal.  Eyes:     General: No scleral icterus.       Right eye: No discharge.        Left eye: No discharge.     Conjunctiva/sclera: Conjunctivae normal.  Neck:     Thyroid: No thyromegaly.  Cardiovascular:     Rate and Rhythm: Normal rate and regular rhythm.  Pulmonary:     Effort: No respiratory distress.     Breath sounds: Normal breath sounds. No wheezing.  Abdominal:     General: Bowel sounds are normal.     Palpations: Abdomen is soft.     Tenderness: There is no abdominal tenderness.  Musculoskeletal:        General: No swelling or tenderness.     Cervical back: Neck supple. No tenderness.  Lymphadenopathy:     Cervical: No cervical adenopathy.  Skin:    Findings: No erythema or rash.  Neurological:     Mental Status: She is alert.  Psychiatric:        Mood and Affect: Mood normal.        Behavior: Behavior normal.      Outpatient Encounter Medications as of 10/10/2021  Medication Sig   aspirin 81 MG EC tablet TAKE 1 TABLET BY MOUTH DAILY   cholecalciferol (VITAMIN D3) 25 MCG (1000 UNIT) tablet Take 1,000 Units by mouth daily.   escitalopram (LEXAPRO) 5 MG tablet TAKE 1 TABLET (5 MG  TOTAL) BY MOUTH DAILY.   magnesium oxide (MAG-OX) 400 MG tablet Take 400 mg by mouth daily.   omeprazole (PRILOSEC) 20 MG capsule Take 1 capsule (20 mg total) by mouth daily.   rosuvastatin (CRESTOR) 5 MG tablet Take 1 tablet (5 mg total) by mouth daily.   tirzepatide (MOUNJARO) 7.5 MG/0.5ML Pen Inject 7.5 mg into the skin once a week.  vitamin B-12 (CYANOCOBALAMIN) 1000 MCG tablet Take 1,000 mcg by mouth daily.   No facility-administered encounter medications on file as of 10/10/2021.     Lab Results  Component Value Date   WBC 6.3 10/08/2021   HGB 12.1 10/08/2021   HCT 36.7 10/08/2021   PLT 290.0 10/08/2021   GLUCOSE 85 10/08/2021   CHOL 137 10/08/2021   TRIG 89.0 10/08/2021   HDL 46.20 10/08/2021   LDLCALC 73 10/08/2021   ALT 34 10/08/2021   AST 29 10/08/2021   NA 138 10/08/2021   K 4.3 10/08/2021   CL 104 10/08/2021   CREATININE 0.85 10/08/2021   BUN 17 10/08/2021   CO2 27 10/08/2021   TSH 1.76 10/08/2021   INR 1.04 10/16/2016   HGBA1C 5.3 04/10/2021       Assessment & Plan:   Problem List Items Addressed This Visit   None    Einar Pheasant, MD

## 2021-10-10 NOTE — Assessment & Plan Note (Signed)
Has seen neurology.  Magnesium. Not taking regularly.  Stable.   

## 2021-10-10 NOTE — Patient Instructions (Signed)
Vitamin D3 2000 units per day 

## 2021-10-13 ENCOUNTER — Encounter: Payer: Self-pay | Admitting: Internal Medicine

## 2021-10-13 DIAGNOSIS — T7840XA Allergy, unspecified, initial encounter: Secondary | ICD-10-CM | POA: Insufficient documentation

## 2021-10-13 NOTE — Assessment & Plan Note (Signed)
No upper symptoms reported. On prilosec.  

## 2021-10-13 NOTE — Assessment & Plan Note (Signed)
Multivitamin with iron as outlined.

## 2021-10-13 NOTE — Assessment & Plan Note (Signed)
Follow cbc and iron studies just checked.  hgb wnl.  Ferritin 12.  Start multivitamin with iron.

## 2021-10-13 NOTE — Assessment & Plan Note (Signed)
Continue lexapro.  Appears to be doing well.  

## 2021-10-13 NOTE — Assessment & Plan Note (Signed)
Mammogram 01/21/21 - [facility] IV.  Saw Dr Bary Castilla.  Recommended f/u bilateral diagnostic mammogram in one year.

## 2021-10-13 NOTE — Assessment & Plan Note (Signed)
Recent reaction as outlined.  Resolved.  Epi pen prescribed to have if needed.  Keep benadryl with her.  Discussed possible allergy testing.  Will notify me if desires to pursue.

## 2021-10-13 NOTE — Assessment & Plan Note (Signed)
No acute abnormality noted on MRI. Continue daily aspirin.  Calcium score 0.  Continue statin.

## 2021-10-13 NOTE — Assessment & Plan Note (Signed)
Evaluated by hematology.  Hetrozygous.   

## 2021-10-13 NOTE — Assessment & Plan Note (Signed)
Restart vitamin D3.

## 2021-11-06 ENCOUNTER — Telehealth: Payer: Self-pay

## 2021-11-06 NOTE — Telephone Encounter (Signed)
[name] Key: Smiley Houseman  Outcome Drug is covered by current benefit plan.  No further PA activity needed  Drug Mounjaro 2.'5MG'$ /0.5ML pen-injectors  Form [employer] Electronic PA Form [id number] NCPDP)

## 2021-11-30 ENCOUNTER — Other Ambulatory Visit: Payer: Self-pay | Admitting: General Surgery

## 2021-11-30 DIAGNOSIS — Z1231 Encounter for screening mammogram for malignant neoplasm of breast: Secondary | ICD-10-CM

## 2022-01-04 ENCOUNTER — Other Ambulatory Visit: Payer: Self-pay | Admitting: Internal Medicine

## 2022-01-04 DIAGNOSIS — Z6841 Body Mass Index (BMI) 40.0 and over, adult: Secondary | ICD-10-CM

## 2022-01-04 DIAGNOSIS — R7301 Impaired fasting glucose: Secondary | ICD-10-CM

## 2022-01-27 ENCOUNTER — Ambulatory Visit
Admission: RE | Admit: 2022-01-27 | Discharge: 2022-01-27 | Disposition: A | Discharge status: Home / Self Care | Payer: Managed Care, Other (non HMO) | Source: Ambulatory Visit | Attending: General Surgery | Admitting: General Surgery

## 2022-01-27 DIAGNOSIS — Z1231 Encounter for screening mammogram for malignant neoplasm of breast: Secondary | ICD-10-CM

## 2022-01-28 ENCOUNTER — Other Ambulatory Visit: Payer: Self-pay | Admitting: General Surgery

## 2022-01-28 DIAGNOSIS — R928 Other abnormal and inconclusive findings on diagnostic imaging of breast: Secondary | ICD-10-CM

## 2022-01-28 DIAGNOSIS — R921 Mammographic calcification found on diagnostic imaging of breast: Secondary | ICD-10-CM

## 2022-02-18 ENCOUNTER — Encounter: Payer: Self-pay | Admitting: Internal Medicine

## 2022-02-20 ENCOUNTER — Other Ambulatory Visit: Payer: Self-pay

## 2022-02-20 MED ORDER — ESCITALOPRAM OXALATE 5 MG PO TABS: 5.0000 mg | ORAL_TABLET | Freq: Every day | ORAL | 1 refills | Status: DC

## 2022-04-03 ENCOUNTER — Other Ambulatory Visit: Payer: Self-pay | Admitting: Family

## 2022-04-03 DIAGNOSIS — R7301 Impaired fasting glucose: Secondary | ICD-10-CM

## 2022-04-03 DIAGNOSIS — Z6841 Body Mass Index (BMI) 40.0 and over, adult: Secondary | ICD-10-CM

## 2022-04-08 ENCOUNTER — Other Ambulatory Visit (INDEPENDENT_AMBULATORY_CARE_PROVIDER_SITE_OTHER): Payer: Managed Care, Other (non HMO)

## 2022-04-08 DIAGNOSIS — Z8673 Personal history of transient ischemic attack (TIA), and cerebral infarction without residual deficits: Secondary | ICD-10-CM | POA: Diagnosis not present

## 2022-04-08 DIAGNOSIS — E611 Iron deficiency: Secondary | ICD-10-CM | POA: Diagnosis not present

## 2022-04-08 LAB — FERRITIN: Ferritin: 11.7 ng/mL (ref 10.0–291.0)

## 2022-04-08 LAB — COMPREHENSIVE METABOLIC PANEL
ALT: 17 U/L (ref 0–35)
AST: 17 U/L (ref 0–37)
Albumin: 4.2 g/dL (ref 3.5–5.2)
Alkaline Phosphatase: 82 U/L (ref 39–117)
BUN: 16 mg/dL (ref 6–23)
CO2: 28 mEq/L (ref 19–32)
Calcium: 8.9 mg/dL (ref 8.4–10.5)
Chloride: 104 mEq/L (ref 96–112)
Creatinine, Ser: 0.77 mg/dL (ref 0.40–1.20)
GFR: 89.82 mL/min (ref >60.00)
Glucose, Bld: 97 mg/dL (ref 70–99)
Potassium: 4.7 mEq/L (ref 3.5–5.1)
Sodium: 138 mEq/L (ref 135–145)
Total Bilirubin: 0.4 mg/dL (ref 0.2–1.2)
Total Protein: 6.9 g/dL (ref 6.0–8.3)

## 2022-04-08 LAB — LIPID PANEL
Cholesterol: 143 mg/dL (ref 0–200)
HDL: 52.7 mg/dL (ref >39.00)
LDL Cholesterol: 78 mg/dL (ref 0–99)
NonHDL: 90.79
Total CHOL/HDL Ratio: 3
Triglycerides: 64 mg/dL (ref 0.0–149.0)
VLDL: 12.8 mg/dL (ref 0.0–40.0)

## 2022-04-08 LAB — HEMOGLOBIN: Hemoglobin: 13 g/dL (ref 12.0–15.0)

## 2022-04-10 ENCOUNTER — Other Ambulatory Visit: Payer: Managed Care, Other (non HMO)

## 2022-04-14 ENCOUNTER — Encounter: Payer: Self-pay | Admitting: Internal Medicine

## 2022-04-14 ENCOUNTER — Ambulatory Visit (INDEPENDENT_AMBULATORY_CARE_PROVIDER_SITE_OTHER): Payer: Managed Care, Other (non HMO) | Admitting: Internal Medicine

## 2022-04-14 VITALS — BP 116/70 | HR 69 | Temp 98.2°F | Resp 17 | Ht 67.0 in | Wt 232.5 lb

## 2022-04-14 DIAGNOSIS — D649 Anemia, unspecified: Secondary | ICD-10-CM | POA: Diagnosis not present

## 2022-04-14 DIAGNOSIS — D6851 Activated protein C resistance: Secondary | ICD-10-CM

## 2022-04-14 DIAGNOSIS — F419 Anxiety disorder, unspecified: Secondary | ICD-10-CM

## 2022-04-14 DIAGNOSIS — K219 Gastro-esophageal reflux disease without esophagitis: Secondary | ICD-10-CM

## 2022-04-14 DIAGNOSIS — Z8669 Personal history of other diseases of the nervous system and sense organs: Secondary | ICD-10-CM

## 2022-04-14 DIAGNOSIS — G43109 Migraine with aura, not intractable, without status migrainosus: Secondary | ICD-10-CM

## 2022-04-14 DIAGNOSIS — N926 Irregular menstruation, unspecified: Secondary | ICD-10-CM

## 2022-04-14 DIAGNOSIS — R928 Other abnormal and inconclusive findings on diagnostic imaging of breast: Secondary | ICD-10-CM | POA: Diagnosis not present

## 2022-04-14 DIAGNOSIS — Z0000 Encounter for general adult medical examination without abnormal findings: Secondary | ICD-10-CM

## 2022-04-14 DIAGNOSIS — E559 Vitamin D deficiency, unspecified: Secondary | ICD-10-CM

## 2022-04-14 DIAGNOSIS — Z8673 Personal history of transient ischemic attack (TIA), and cerebral infarction without residual deficits: Secondary | ICD-10-CM

## 2022-04-14 NOTE — Progress Notes (Signed)
Patient ID: Gwendolyn Torres, female   DOB: August 14, 1970, 51 y.o.   MRN: [id number]   Subjective:    Patient ID: Gwendolyn Torres, female    DOB: 1971-03-01, 51 y.o.   MRN: [id number]   Patient here for  Chief Complaint  Patient presents with   Annual Exam    cpe   .   HPI Here for her physical exam. History of CVA.  On aspirin.  Calcium score - 0.  Saw cardiology 11/12/21.  Review of all Linq tracings have not shown any significant arrhythmias.  TEE unremarkable.  No changes made.  Recommended f/u this month.  Saw Dr Bary Castilla 02/04/22 - f/u abnormal mammogram.  Elected to hold on biopsy.  Recommended mammogram in one year - f/u Dr Peyton Najjar.  Saw GI - Dr Haig Prophet - abdominal pain and constipation.  Recommended miralax. Helping.  Off crestor.  No chest pain or sob reported.  No cough or congestion.  No abdominal pain.  Noticed three months - summer - no period.  Last two months - period.  Not heavy. Discussed keeping a menstrual diary.   On lexapro. Appears to be handling things relatively well.     Past Medical History:  Diagnosis Date   GERD (gastroesophageal reflux disease)    Past Surgical History:  Procedure Laterality Date   BUNIONECTOMY Right 09/25/2016   Procedure: lapidus type  modified mcbride;  Surgeon: Albertine Patricia, DPM;  Location: [place];  Service: Podiatry;  Laterality: Right;   CESAREAN SECTION  2004   LOOP RECORDER INSERTION N/A 02/19/2017   Procedure: LOOP RECORDER INSERTION;  Surgeon: Isaias Cowman, MD;  Location: [place] [facility];  Service: Cardiovascular;  Laterality: N/A;   OSTECTOMY Right 09/25/2016   Procedure: OSTECTOMY RT  1SR,2ND 3RD  metatarsal;  Surgeon: Albertine Patricia, DPM;  Location: [place];  Service: Podiatry;  Laterality: Right;  LMA LOCAL   TEE WITHOUT CARDIOVERSION N/A 10/28/2016   Procedure: TRANSESOPHAGEAL ECHOCARDIOGRAM (TEE);  Surgeon: Teodoro Spray, MD;  Location: [facility];  Service: Cardiovascular;  Laterality: N/A;    Family History  Problem Relation Age of Onset   Aortic aneurysm Father        also has popliteal aneurysm   Hypertension Father    Hypercholesterolemia Father    Diabetes Father    Aortic aneurysm Mother    Hypercholesterolemia Mother    Hypercholesterolemia Sister        x2   Hyperlipidemia Brother    Hypertension Sister    Breast cancer Maternal Aunt    Melanoma Maternal Uncle    Colon cancer Neg Hx    Social History   Socioeconomic History   Marital status: Married    Spouse name: Not on file   Number of children: 1   Years of education: Not on file   Highest education level: Not on file  Occupational History   Occupation: transcriptionist  Tobacco Use   Smoking status: Never   Smokeless tobacco: Never  Vaping Use   Vaping Use: Never used  Substance and Sexual Activity   Alcohol use: No    Alcohol/week: 0.0 standard drinks of alcohol   Drug use: No   Sexual activity: Yes    Birth control/protection: None  Other Topics Concern   Not on file  Social History Narrative   Not on file   Social Determinants of Health   Financial Resource Strain: Low Risk  (01/15/2021)   Overall Financial Resource Strain (CARDIA)  Difficulty of Paying Living Expenses: Not hard at all  Food Insecurity: Not on file  Transportation Needs: Not on file  Physical Activity: Not on file  Stress: Not on file  Social Connections: Not on file     Review of Systems  Constitutional:  Negative for appetite change and unexpected weight change.  HENT:  Negative for congestion, sinus pressure and sore throat.   Eyes:  Negative for pain and visual disturbance.  Respiratory:  Negative for cough, chest tightness and shortness of breath.   Cardiovascular:  Negative for chest pain, palpitations and leg swelling.  Gastrointestinal:  Negative for abdominal pain, diarrhea, nausea and vomiting.  Genitourinary:  Negative for difficulty urinating and dysuria.  Musculoskeletal:  Negative for joint  swelling and myalgias.  Skin:  Negative for color change and rash.  Neurological:  Negative for dizziness and headaches.  Hematological:  Negative for adenopathy. Does not bruise/bleed easily.  Psychiatric/Behavioral:  Negative for agitation and dysphoric mood.        Objective:     BP 116/70 (BP Location: Left Arm, Patient Position: Sitting, Cuff Size: Small)   Pulse 69   Temp 98.2 F (36.8 C) (Temporal)   Resp 17   Ht '5\' 7"'$  (1.702 m)   Wt 232 lb 8 oz (105.5 kg)   SpO2 98%   BMI 36.41 kg/m  Wt Readings from Last 3 Encounters:  04/14/22 232 lb 8 oz (105.5 kg)  10/10/21 243 lb 12.8 oz (110.6 kg)  10/03/21 240 lb (108.9 kg)    Physical Exam Vitals reviewed.  Constitutional:      General: She is not in acute distress.    Appearance: Normal appearance. She is well-developed.  HENT:     Head: Normocephalic and atraumatic.     Right Ear: External ear normal.     Left Ear: External ear normal.  Eyes:     General: No scleral icterus.       Right eye: No discharge.        Left eye: No discharge.     Conjunctiva/sclera: Conjunctivae normal.  Neck:     Thyroid: No thyromegaly.  Cardiovascular:     Rate and Rhythm: Normal rate and regular rhythm.  Pulmonary:     Effort: No tachypnea, accessory muscle usage or respiratory distress.     Breath sounds: Normal breath sounds. No decreased breath sounds or wheezing.  Chest:  Breasts:    Right: No inverted nipple, mass, nipple discharge or tenderness (no axillary adenopathy).     Left: No inverted nipple, mass, nipple discharge or tenderness (no axilarry adenopathy).  Abdominal:     General: Bowel sounds are normal.     Palpations: Abdomen is soft.     Tenderness: There is no abdominal tenderness.  Musculoskeletal:        General: No swelling or tenderness.     Cervical back: Neck supple. No tenderness.  Lymphadenopathy:     Cervical: No cervical adenopathy.  Skin:    Findings: No erythema or rash.  Neurological:      Mental Status: She is alert and oriented to person, place, and time.  Psychiatric:        Mood and Affect: Mood normal.        Behavior: Behavior normal.      Outpatient Encounter Medications as of 04/14/2022  Medication Sig   aspirin 81 MG EC tablet TAKE 1 TABLET BY MOUTH DAILY   cholecalciferol (VITAMIN D3) 25 MCG (1000 UNIT) tablet Take 1,000  Units by mouth daily.   EPINEPHrine (EPIPEN 2-PAK) 0.3 mg/0.3 mL IJ SOAJ injection Inject 0.3 mg into the muscle as needed for anaphylaxis.   escitalopram (LEXAPRO) 5 MG tablet Take 1 tablet (5 mg total) by mouth daily.   magnesium oxide (MAG-OX) 400 MG tablet Take 400 mg by mouth daily.   omeprazole (PRILOSEC) 20 MG capsule Take 1 capsule (20 mg total) by mouth daily.   vitamin B-12 (CYANOCOBALAMIN) 1000 MCG tablet Take 1,000 mcg by mouth daily.   [DISCONTINUED] MOUNJARO 7.5 MG/0.5ML Pen INJECT 7.5 MG SUBCUTANEOUSLY WEEKLY   [DISCONTINUED] rosuvastatin (CRESTOR) 5 MG tablet Take 1 tablet (5 mg total) by mouth daily. (Patient not taking: Reported on 04/14/2022)   No facility-administered encounter medications on file as of 04/14/2022.     Lab Results  Component Value Date   WBC 6.3 10/08/2021   HGB 13.0 04/08/2022   HCT 36.7 10/08/2021   PLT 290.0 10/08/2021   GLUCOSE 97 04/08/2022   CHOL 143 04/08/2022   TRIG 64.0 04/08/2022   HDL 52.70 04/08/2022   LDLCALC 78 04/08/2022   ALT 17 04/08/2022   AST 17 04/08/2022   NA 138 04/08/2022   K 4.7 04/08/2022   CL 104 04/08/2022   CREATININE 0.77 04/08/2022   BUN 16 04/08/2022   CO2 28 04/08/2022   TSH 1.76 10/08/2021   INR 1.04 10/16/2016   HGBA1C 5.3 04/10/2021    MM DIAG BREAST TOMO BILATERAL  Result Date: 01/27/2022 CLINICAL DATA:  51 year old female presenting for 2 year follow-up of calcifications in the right breast. Biopsy has been recommended but the patient and her physician prefer short-term interval follow-up. EXAM: DIGITAL DIAGNOSTIC BILATERAL MAMMOGRAM WITH TOMOSYNTHESIS  TECHNIQUE: Bilateral digital diagnostic mammography and breast tomosynthesis was performed. COMPARISON:  Previous exam(s). ACR Breast Density Category c: The breast tissue is heterogeneously dense, which may obscure small masses. FINDINGS: Grouped pleomorphic calcifications in the lower outer quadrant of the right breast spanning an area of 7 mm have a stable appearance. No new suspicious mass or distortion detected. IMPRESSION: Suspicious calcifications in the lower outer quadrant of the right breast are stable. No additional abnormality seen in either breast. RECOMMENDATION: Stereotactic biopsy of the right breast calcifications is recommended. The patient states she is considering biopsy. If biopsy is not performed continued follow-up is recommended. I have discussed the findings and recommendations with the patient. If applicable, a reminder letter will be sent to the patient regarding the next appointment. BI-RADS CATEGORY  4: Suspicious. Electronically Signed   By: Lillia Mountain M.D.   On: 01/27/2022 14:06      Assessment & Plan:   Problem List Items Addressed This Visit     Abnormal mammogram    Dr Bary Castilla 02/05/22 - dianostic mammogram in one year.  F/u wih Dr Windell Moment.        Anemia    Follow cbc and iron studies just checked.       Relevant Orders   CBC with Differential/Platelet   Ferritin   Anxiety    Continue lexapro.  Appears to be doing well.       Factor V Leiden mutation [facility])    Evaluated by hematology.  Hetrozygous.        Gastroesophageal reflux disease    No upper symptoms reported.  On prilosec.       Health care maintenance    Physical today 04/14/22.  PAP 04/09/21 - negative with negative HPV.   Mammogram 01/27/22 - Birads IV.  Saw  Dr Bary Castilla.  Recommended f/u bilateral diagnostic mammogram in one year.  Colonoscopy 05/17/20 - recommended f/u in 10 years.        History of CVA (cerebrovascular accident)    No acute abnormality noted on MRI. Continue daily  aspirin.  Calcium score 0.  Desires not to take crestor.       Relevant Orders   Basic metabolic panel   Hepatic function panel   Lipid panel   History of seizure disorder    Negative EEG.  Off keppra.  Doing well.  Followed by neurology.       Menstrual changes    Periods as outlined.  Keep menstrual diary.       Migraine    Has seen neurology.  Magnesium. Not taking regularly.  Stable.        Vitamin D deficiency    Check vitamin D level with next labs.       Relevant Orders   VITAMIN D 25 Hydroxy (Vit-D Deficiency, Fractures)   Other Visit Diagnoses     Routine general medical examination at a health care facility    -  Primary        Einar Pheasant, MD

## 2022-04-14 NOTE — Assessment & Plan Note (Signed)
Physical today 04/14/22.  PAP 04/09/21 - negative with negative HPV.   Mammogram 01/27/22 - Birads IV.  Saw Dr Bary Castilla.  Recommended f/u bilateral diagnostic mammogram in one year.  Colonoscopy 05/17/20 - recommended f/u in 10 years.

## 2022-04-20 ENCOUNTER — Encounter: Payer: Self-pay | Admitting: Internal Medicine

## 2022-04-22 ENCOUNTER — Other Ambulatory Visit: Payer: Self-pay

## 2022-04-22 ENCOUNTER — Telehealth: Payer: Self-pay

## 2022-04-22 ENCOUNTER — Other Ambulatory Visit (HOSPITAL_COMMUNITY): Payer: Self-pay

## 2022-04-22 DIAGNOSIS — Z6841 Body Mass Index (BMI) 40.0 and over, adult: Secondary | ICD-10-CM

## 2022-04-22 DIAGNOSIS — R7301 Impaired fasting glucose: Secondary | ICD-10-CM

## 2022-04-22 MED ORDER — MOUNJARO 7.5 MG/0.5ML ~~LOC~~ SOAJ: SUBCUTANEOUS | 2 refills | Status: DC

## 2022-04-22 NOTE — Telephone Encounter (Signed)
/  Pharmacy Patient Advocate Encounter   Received notification from [facility] that prior authorization for Mounjaro 7.'5mg'$ /0.26m is required/requested.  Per Test Claim: Prior authorization is needed   PA submitted on 04/22/22 to (ins) [employer]  via CoverMyMeds/ Key [id number] is pending

## 2022-04-29 ENCOUNTER — Telehealth: Payer: Self-pay | Admitting: Internal Medicine

## 2022-04-29 ENCOUNTER — Other Ambulatory Visit: Payer: Self-pay

## 2022-04-29 ENCOUNTER — Encounter: Payer: Self-pay | Admitting: Internal Medicine

## 2022-04-29 DIAGNOSIS — N926 Irregular menstruation, unspecified: Secondary | ICD-10-CM | POA: Insufficient documentation

## 2022-04-29 NOTE — Telephone Encounter (Signed)
Mychart sent.

## 2022-04-29 NOTE — Assessment & Plan Note (Signed)
Follow cbc and iron studies just checked.

## 2022-04-29 NOTE — Assessment & Plan Note (Signed)
Has seen neurology.  Magnesium. Not taking regularly.  Stable.

## 2022-04-29 NOTE — Assessment & Plan Note (Signed)
No upper symptoms reported. On prilosec.  

## 2022-04-29 NOTE — Assessment & Plan Note (Signed)
Periods as outlined.  Keep menstrual diary.

## 2022-04-29 NOTE — Assessment & Plan Note (Addendum)
No acute abnormality noted on MRI. Continue daily aspirin.  Calcium score 0.  Desires not to take crestor.

## 2022-04-29 NOTE — Assessment & Plan Note (Signed)
Evaluated by hematology.  Hetrozygous.

## 2022-04-29 NOTE — Assessment & Plan Note (Signed)
Negative EEG.  Off keppra.  Doing well.  Followed by neurology.

## 2022-04-29 NOTE — Telephone Encounter (Signed)
Yes.  Call in 4 weeks and let us know how doing.  Can increase dose if ok.

## 2022-04-29 NOTE — Assessment & Plan Note (Signed)
Dr Bary Castilla 02/05/22 - dianostic mammogram in one year.  F/u wih Dr Windell Moment.

## 2022-04-29 NOTE — Telephone Encounter (Signed)
Please notify Jaylean- given no history of diabetes, will need to prescribe wegovy.  If agreeable, can prescribe and then see what insurance will cover.  Let us know if pt assistance required.

## 2022-04-29 NOTE — Assessment & Plan Note (Signed)
Check vitamin D level with next labs.  ?

## 2022-04-29 NOTE — Telephone Encounter (Signed)
Pharmacy Patient Advocate Encounter  Received notification from [place] [facility] that the request for prior authorization for Mounjaro 7.'5mg'$ /0.60m has been denied due to .     How would you like to proceed?  Please be advised appeals may take up to 5 business days to be submitted as pharmacist prepares necessary documentation.  Thank you!

## 2022-04-29 NOTE — Assessment & Plan Note (Signed)
Continue lexapro.  Appears to be doing well.

## 2022-04-30 ENCOUNTER — Other Ambulatory Visit: Payer: Self-pay

## 2022-04-30 MED ORDER — WEGOVY 0.25 MG/0.5ML ~~LOC~~ SOAJ: 0.2500 mg | SUBCUTANEOUS | 0 refills | Status: DC

## 2022-04-30 NOTE — Telephone Encounter (Signed)
Rx sent 

## 2022-05-02 ENCOUNTER — Telehealth: Payer: Self-pay

## 2022-05-02 ENCOUNTER — Other Ambulatory Visit (HOSPITAL_COMMUNITY): Payer: Self-pay

## 2022-05-02 NOTE — Telephone Encounter (Signed)
Pharmacy Patient Advocate Encounter   Received notification from [facility] that prior authorization for Wegovy 0.'25MG'$ /0.5ML auto-injectors is required/requested.  PA submitted on 05/02/22 to (ins) [employer] via CoverMyMeds Key [id number] Status is pending

## 2022-05-07 ENCOUNTER — Other Ambulatory Visit (HOSPITAL_COMMUNITY): Payer: Self-pay

## 2022-05-07 NOTE — Telephone Encounter (Signed)
Mychart sent to pt to advise 

## 2022-05-07 NOTE — Telephone Encounter (Signed)
Patient Advocate Encounter  Prior Authorization for [employer] 0.'25MG'$ /0.5ML auto-injectors has been approved.    Key: [id number] Effective dates: 05/02/22 through 11/28/22

## 2022-05-19 MED ORDER — WEGOVY 0.25 MG/0.5ML ~~LOC~~ SOAJ: 0.5000 mg | SUBCUTANEOUS | 2 refills | Status: DC

## 2022-05-19 NOTE — Telephone Encounter (Signed)
Rx sent in for wegovy .'5mg'$ .  Please notify Takya and pharmacy - changing rx.

## 2022-05-26 ENCOUNTER — Other Ambulatory Visit: Payer: Self-pay

## 2022-05-26 MED ORDER — WEGOVY 0.25 MG/0.5ML ~~LOC~~ SOAJ: 0.5000 mg | SUBCUTANEOUS | 2 refills | Status: DC

## 2022-06-18 ENCOUNTER — Encounter: Payer: Self-pay | Admitting: Internal Medicine

## 2022-06-18 MED ORDER — ZEPBOUND 2.5 MG/0.5ML ~~LOC~~ SOAJ: 2.5000 mg | SUBCUTANEOUS | 2 refills | Status: DC

## 2022-06-18 NOTE — Telephone Encounter (Signed)
Spoke to [facility].  Rx sent in for zepbound.  Will call with update.

## 2022-06-20 ENCOUNTER — Telehealth: Payer: Self-pay

## 2022-06-20 ENCOUNTER — Other Ambulatory Visit (HOSPITAL_COMMUNITY): Payer: Self-pay

## 2022-06-20 NOTE — Telephone Encounter (Signed)
Patient Advocate Encounter   Received notification from [facility] that prior authorization for Zepbound is required.   PA submitted on 06/20/2022 Key [id number] Status is pending

## 2022-07-03 NOTE — Telephone Encounter (Signed)
My chart msg sent to pt to let her know.

## 2022-07-03 NOTE — Telephone Encounter (Signed)
Pharmacy Patient Advocate Encounter  Prior Authorization for Gwendolyn Torres has been approved by [place]    PA # [id number] Effective dates: 06/20/2022 through 02/19/2023

## 2022-07-28 NOTE — Telephone Encounter (Signed)
Ok to send in the 5mg  dose.

## 2022-07-29 MED ORDER — ZEPBOUND 5 MG/0.5ML ~~LOC~~ SOAJ: 5.0000 mg | SUBCUTANEOUS | 2 refills | Status: DC

## 2022-08-07 ENCOUNTER — Other Ambulatory Visit: Payer: Self-pay | Admitting: Internal Medicine

## 2022-08-12 ENCOUNTER — Other Ambulatory Visit (INDEPENDENT_AMBULATORY_CARE_PROVIDER_SITE_OTHER): Payer: Managed Care, Other (non HMO)

## 2022-08-12 DIAGNOSIS — Z8673 Personal history of transient ischemic attack (TIA), and cerebral infarction without residual deficits: Secondary | ICD-10-CM | POA: Diagnosis not present

## 2022-08-12 DIAGNOSIS — E559 Vitamin D deficiency, unspecified: Secondary | ICD-10-CM | POA: Diagnosis not present

## 2022-08-12 DIAGNOSIS — D649 Anemia, unspecified: Secondary | ICD-10-CM

## 2022-08-12 LAB — HEPATIC FUNCTION PANEL
ALT: 16 U/L (ref 0–35)
AST: 18 U/L (ref 0–37)
Albumin: 3.9 g/dL (ref 3.5–5.2)
Alkaline Phosphatase: 70 U/L (ref 39–117)
Bilirubin, Direct: 0.1 mg/dL (ref 0.0–0.3)
Total Bilirubin: 0.4 mg/dL (ref 0.2–1.2)
Total Protein: 6.5 g/dL (ref 6.0–8.3)

## 2022-08-12 LAB — CBC WITH DIFFERENTIAL/PLATELET
Basophils Absolute: 0.1 10*3/uL (ref 0.0–0.1)
Basophils Relative: 1.5 % (ref 0.0–3.0)
Eosinophils Absolute: 0.2 10*3/uL (ref 0.0–0.7)
Eosinophils Relative: 2.7 % (ref 0.0–5.0)
HCT: 37.2 % (ref 36.0–46.0)
Hemoglobin: 12.6 g/dL (ref 12.0–15.0)
Lymphocytes Relative: 27.7 % (ref 12.0–46.0)
Lymphs Abs: 1.6 10*3/uL (ref 0.7–4.0)
MCHC: 33.9 g/dL (ref 30.0–36.0)
MCV: 84.1 fl (ref 78.0–100.0)
Monocytes Absolute: 0.6 10*3/uL (ref 0.1–1.0)
Monocytes Relative: 9.7 % (ref 3.0–12.0)
Neutro Abs: 3.5 10*3/uL (ref 1.4–7.7)
Neutrophils Relative %: 58.4 % (ref 43.0–77.0)
Platelets: 272 10*3/uL (ref 150.0–400.0)
RBC: 4.43 Mil/uL (ref 3.87–5.11)
RDW: 14.5 % (ref 11.5–15.5)
WBC: 5.9 10*3/uL (ref 4.0–10.5)

## 2022-08-12 LAB — BASIC METABOLIC PANEL
BUN: 19 mg/dL (ref 6–23)
CO2: 27 mEq/L (ref 19–32)
Calcium: 9.2 mg/dL (ref 8.4–10.5)
Chloride: 104 mEq/L (ref 96–112)
Creatinine, Ser: 0.89 mg/dL (ref 0.40–1.20)
GFR: 75.31 mL/min (ref >60.00)
Glucose, Bld: 97 mg/dL (ref 70–99)
Potassium: 4.5 mEq/L (ref 3.5–5.1)
Sodium: 140 mEq/L (ref 135–145)

## 2022-08-12 LAB — LIPID PANEL
Cholesterol: 143 mg/dL (ref 0–200)
HDL: 51.6 mg/dL (ref >39.00)
LDL Cholesterol: 77 mg/dL (ref 0–99)
NonHDL: 90.91
Total CHOL/HDL Ratio: 3
Triglycerides: 71 mg/dL (ref 0.0–149.0)
VLDL: 14.2 mg/dL (ref 0.0–40.0)

## 2022-08-12 LAB — VITAMIN D 25 HYDROXY (VIT D DEFICIENCY, FRACTURES): VITD: 21.72 ng/mL — ABNORMAL LOW (ref 30.00–100.00)

## 2022-08-12 LAB — FERRITIN: Ferritin: 15.4 ng/mL (ref 10.0–291.0)

## 2022-08-15 ENCOUNTER — Ambulatory Visit: Payer: Managed Care, Other (non HMO) | Admitting: Internal Medicine

## 2022-08-15 ENCOUNTER — Encounter: Payer: Self-pay | Admitting: Internal Medicine

## 2022-08-15 VITALS — BP 122/70 | HR 68 | Temp 97.9°F | Resp 16 | Ht 67.0 in | Wt 232.0 lb

## 2022-08-15 DIAGNOSIS — K219 Gastro-esophageal reflux disease without esophagitis: Secondary | ICD-10-CM

## 2022-08-15 DIAGNOSIS — F419 Anxiety disorder, unspecified: Secondary | ICD-10-CM

## 2022-08-15 DIAGNOSIS — D649 Anemia, unspecified: Secondary | ICD-10-CM

## 2022-08-15 DIAGNOSIS — Z8673 Personal history of transient ischemic attack (TIA), and cerebral infarction without residual deficits: Secondary | ICD-10-CM | POA: Diagnosis not present

## 2022-08-15 DIAGNOSIS — R928 Other abnormal and inconclusive findings on diagnostic imaging of breast: Secondary | ICD-10-CM | POA: Diagnosis not present

## 2022-08-15 DIAGNOSIS — D6851 Activated protein C resistance: Secondary | ICD-10-CM

## 2022-08-15 DIAGNOSIS — E559 Vitamin D deficiency, unspecified: Secondary | ICD-10-CM

## 2022-08-15 DIAGNOSIS — Z8669 Personal history of other diseases of the nervous system and sense organs: Secondary | ICD-10-CM

## 2022-08-15 MED ORDER — OMEPRAZOLE 20 MG PO CPDR: 20.0000 mg | DELAYED_RELEASE_CAPSULE | Freq: Every day | ORAL | 1 refills | Status: DC

## 2022-08-15 MED ORDER — EPINEPHRINE 0.3 MG/0.3ML IJ SOAJ: 0.3000 mg | INTRAMUSCULAR | 0 refills | Status: AC | PRN

## 2022-08-15 NOTE — Progress Notes (Unsigned)
Subjective:    Patient ID: Gwendolyn Torres, female    DOB: [birth year], 52 y.o.   MRN: [id number]  Patient here for  Chief Complaint  Patient presents with   Medical Management of Chronic Issues    HPI Here for follow up appt. History of CVA. On aspirin. Calcium score - 0. Saw cardiology 11/12/21. Review of all Linq tracings have not shown any significant arrhythmias. TEE unremarkable. No reoccurring problems. Saw Dr Lemar Livings 02/04/22 - f/u abnormal mammogram. Elected to hold on biopsy. Recommended mammogram in one year - f/u Dr Maia Plan. No chest pain or increased sob.  No cough or congestion.  On lexapro. Doing well on this dose. Started zepbound. Tolerating.  Just started .  No nausea.  No acid reflux.  No swallowing problems.    Past Medical History:  Diagnosis Date   GERD (gastroesophageal reflux disease)    Past Surgical History:  Procedure Laterality Date   BUNIONECTOMY Right 09/25/2016   Procedure: lapidus type  modified mcbride;  Surgeon: Recardo Evangelist, DPM;  Location: [facility];  Service: Podiatry;  Laterality: Right;   CESAREAN SECTION  2004   LOOP RECORDER INSERTION N/A 02/19/2017   Procedure: LOOP RECORDER INSERTION;  Surgeon: Marcina Millard, MD;  Location: [facility];  Service: Cardiovascular;  Laterality: N/A;   OSTECTOMY Right 09/25/2016   Procedure: OSTECTOMY RT  1SR,2ND 3RD  metatarsal;  Surgeon: Recardo Evangelist, DPM;  Location: [facility];  Service: Podiatry;  Laterality: Right;  LMA LOCAL   TEE WITHOUT CARDIOVERSION N/A 10/28/2016   Procedure: TRANSESOPHAGEAL ECHOCARDIOGRAM (TEE);  Surgeon: Dalia Heading, MD;  Location: [facility];  Service: Cardiovascular;  Laterality: N/A;   Family History  Problem Relation Age of Onset   Aortic aneurysm Father        also has popliteal aneurysm   Hypertension Father    Hypercholesterolemia Father    Diabetes Father    Aortic aneurysm Mother    Hypercholesterolemia Mother     Hypercholesterolemia Sister        x2   Hyperlipidemia Brother    Hypertension Sister    Breast cancer Maternal Aunt    Melanoma Maternal Uncle    Colon cancer Neg Hx    Social History   Socioeconomic History   Marital status: Married    Spouse name: Not on file   Number of children: 1   Years of education: Not on file   Highest education level: Not on file  Occupational History   Occupation: transcriptionist  Tobacco Use   Smoking status: Never   Smokeless tobacco: Never  Vaping Use   Vaping Use: Never used  Substance and Sexual Activity   Alcohol use: No    Alcohol/week: 0.0 standard drinks of alcohol   Drug use: No   Sexual activity: Yes    Birth control/protection: None  Other Topics Concern   Not on file  Social History Narrative   Not on file   Social Determinants of Health   Financial Resource Strain: Low Risk  (01/15/2021)   Overall Financial Resource Strain (CARDIA)    Difficulty of Paying Living Expenses: Not hard at all  Food Insecurity: Not on file  Transportation Needs: Not on file  Physical Activity: Not on file  Stress: Not on file  Social Connections: Not on file     Review of Systems  Constitutional:  Negative for appetite change and unexpected weight change.  HENT:  Negative for congestion and sinus pressure.  Respiratory:  Negative for cough, chest tightness and shortness of breath.   Cardiovascular:  Negative for chest pain and palpitations.  Gastrointestinal:  Negative for abdominal pain, diarrhea, nausea and vomiting.  Genitourinary:  Negative for difficulty urinating and dysuria.  Musculoskeletal:  Negative for joint swelling and myalgias.  Skin:  Negative for color change and rash.  Neurological:  Negative for dizziness and headaches.  Psychiatric/Behavioral:  Negative for agitation and dysphoric mood.        Objective:     BP 122/70   Pulse 68   Temp 97.9 F (36.6 C)   Resp 16   Ht  (1.702 m)   Wt 232 lb (105.2 kg)    SpO2 97%   BMI 36.34 kg/m  Wt Readings from Last 3 Encounters:  08/15/22 232 lb (105.2 kg)  04/14/22 232 lb 8 oz (105.5 kg)  10/10/21 243 lb 12.8 oz (110.6 kg)    Physical Exam Vitals reviewed.  Constitutional:      General: She is not in acute distress.    Appearance: Normal appearance.  HENT:     Head: Normocephalic and atraumatic.     Right Ear: External ear normal.     Left Ear: External ear normal.  Eyes:     General: No scleral icterus.       Right eye: No discharge.        Left eye: No discharge.     Conjunctiva/sclera: Conjunctivae normal.  Neck:     Thyroid: No thyromegaly.  Cardiovascular:     Rate and Rhythm: Normal rate and regular rhythm.  Pulmonary:     Effort: No respiratory distress.     Breath sounds: Normal breath sounds. No wheezing.  Abdominal:     General: Bowel sounds are normal.     Palpations: Abdomen is soft.     Tenderness: There is no abdominal tenderness.  Musculoskeletal:        General: No swelling or tenderness.     Cervical back: Neck supple. No tenderness.  Lymphadenopathy:     Cervical: No cervical adenopathy.  Skin:    Findings: No erythema or rash.  Neurological:     Mental Status: She is alert.  Psychiatric:        Mood and Affect: Mood normal.        Behavior: Behavior normal.      Outpatient Encounter Medications as of 08/15/2022  Medication Sig   aspirin 81 MG EC tablet TAKE 1 TABLET BY MOUTH DAILY   cholecalciferol (VITAMIN D3) 25 MCG (1000 UNIT) tablet Take 1,000 Units by mouth daily.   EPINEPHrine (EPIPEN 2-PAK) 0.3 mg/0.3 mL IJ SOAJ injection Inject 0.3 mg into the muscle as needed for anaphylaxis.   escitalopram (LEXAPRO) 5 MG tablet TAKE 1 TABLET (5 MG TOTAL) BY MOUTH DAILY.   magnesium oxide (MAG-OX) 400 MG tablet Take 400 mg by mouth daily.   omeprazole (PRILOSEC) 20 MG capsule Take 1 capsule (20 mg total) by mouth daily.   tirzepatide (ZEPBOUND) 5 MG/0.5ML Pen Inject 5 mg into the skin once a week.   vitamin  B-12 (CYANOCOBALAMIN) 1000 MCG tablet Take 1,000 mcg by mouth daily.   [DISCONTINUED] EPINEPHrine (EPIPEN 2-PAK) 0.3 mg/0.3 mL IJ SOAJ injection Inject 0.3 mg into the muscle as needed for anaphylaxis.   [DISCONTINUED] omeprazole (PRILOSEC) 20 MG capsule Take 1 capsule (20 mg total) by mouth daily.   No facility-administered encounter medications on file as of 08/15/2022.     Lab Results  Component Value Date   WBC 5.9 08/12/2022   HGB 12.6 08/12/2022   HCT 37.2 08/12/2022   PLT 272.0 08/12/2022   GLUCOSE 97 08/12/2022   CHOL 143 08/12/2022   TRIG 71.0 08/12/2022   HDL 51.60 08/12/2022   LDLCALC 77 08/12/2022   ALT 16 08/12/2022   AST 18 08/12/2022   NA 140 08/12/2022   K 4.5 08/12/2022   CL 104 08/12/2022   CREATININE 0.89 08/12/2022   BUN 19 08/12/2022   CO2 27 08/12/2022   TSH 1.76 10/08/2021   INR 1.04 10/16/2016   HGBA1C 5.3 04/10/2021    MM DIAG BREAST TOMO BILATERAL  Result Date: 01/27/2022 CLINICAL DATA:  52 year old female presenting for 2 year follow-up of calcifications in the right breast. Biopsy has been recommended but the patient and her physician prefer short-term interval follow-up. EXAM: DIGITAL DIAGNOSTIC BILATERAL MAMMOGRAM WITH TOMOSYNTHESIS TECHNIQUE: Bilateral digital diagnostic mammography and breast tomosynthesis was performed. COMPARISON:  Previous exam(s). ACR Breast Density Category c: The breast tissue is heterogeneously dense, which may obscure small masses. FINDINGS: Grouped pleomorphic calcifications in the lower outer quadrant of the right breast spanning an area of 7 mm have a stable appearance. No new suspicious mass or distortion detected. IMPRESSION: Suspicious calcifications in the lower outer quadrant of the right breast are stable. No additional abnormality seen in either breast. RECOMMENDATION: Stereotactic biopsy of the right breast calcifications is recommended. The patient states she is considering biopsy. If biopsy is not performed  continued follow-up is recommended. I have discussed the findings and recommendations with the patient. If applicable, a reminder letter will be sent to the patient regarding the next appointment. BI-RADS CATEGORY  4: Suspicious. Electronically Signed   By: Baird Lyons M.D.   On: 01/27/2022 14:06      Assessment & Plan:  History of CVA (cerebrovascular accident) Assessment & Plan: No acute abnormality noted on MRI. Continue daily aspirin.  Calcium score 0.  Desires not to take crestor.   Orders: -     Lipid panel; Future -     Hepatic function panel; Future -     Basic metabolic panel; Future -     TSH; Future  Abnormal mammogram Assessment & Plan: Dr Lemar Livings 02/05/22 - dianostic mammogram in one year.  F/u wih Dr Hazle Quant.     Anemia, unspecified type Assessment & Plan: Follow cbc.  Hgb wnl 12.6 - 08/12/22.  Off iron.  Follow.   Orders: -     CBC with Differential/Platelet; Future -     Ferritin; Future  Anxiety Assessment & Plan: Continue lexapro.  Appears to be doing well.    Factor V Leiden mutation Assessment & Plan: Evaluated by hematology.  Hetrozygous.     Gastroesophageal reflux disease without esophagitis Assessment & Plan: No upper symptoms reported.  On prilosec.    Vitamin D deficiency Assessment & Plan: Recent vitamin D level 21.  Start vitamin D3 1000 units per day.  Follow.     History of seizure disorder Assessment & Plan: Negative EEG.  Off keppra.  Doing well.  Followed by neurology.    Other orders -     EPINEPHrine; Inject 0.3 mg into the muscle as needed for anaphylaxis.  Dispense: 2 each; Refill: 0 -     Omeprazole; Take 1 capsule (20 mg total) by mouth daily.  Dispense: 90 capsule; Refill: 1     Dale Tanacross, MD

## 2022-08-17 ENCOUNTER — Encounter: Payer: Self-pay | Admitting: Internal Medicine

## 2022-08-17 NOTE — Assessment & Plan Note (Signed)
Dr Byrnett 02/05/22 - dianostic mammogram in one year.  F/u wih Dr Cintron-Diaz.   

## 2022-08-17 NOTE — Assessment & Plan Note (Signed)
Negative EEG.  Off keppra.  Doing well.  Followed by neurology.  

## 2022-08-17 NOTE — Assessment & Plan Note (Signed)
Evaluated by hematology.  Hetrozygous.   

## 2022-08-17 NOTE — Assessment & Plan Note (Signed)
No upper symptoms reported. On prilosec.  

## 2022-08-17 NOTE — Assessment & Plan Note (Signed)
Recent vitamin D level 21.  Start vitamin D3 1000 units per day.  Follow.

## 2022-08-17 NOTE — Assessment & Plan Note (Signed)
Continue lexapro.  Appears to be doing well.  

## 2022-08-17 NOTE — Assessment & Plan Note (Signed)
No acute abnormality noted on MRI. Continue daily aspirin.  Calcium score 0.  Desires not to take crestor.  

## 2022-08-17 NOTE — Assessment & Plan Note (Signed)
Follow cbc.  Hgb wnl 12.6 - 08/12/22.  Off iron.  Follow.

## 2022-08-18 ENCOUNTER — Other Ambulatory Visit: Payer: Self-pay | Admitting: General Surgery

## 2022-08-18 ENCOUNTER — Ambulatory Visit
Admission: RE | Admit: 2022-08-18 | Discharge: 2022-08-18 | Disposition: A | Discharge status: Home / Self Care | Payer: Managed Care, Other (non HMO) | Source: Ambulatory Visit | Attending: General Surgery | Admitting: General Surgery

## 2022-08-18 DIAGNOSIS — R112 Nausea with vomiting, unspecified: Secondary | ICD-10-CM

## 2022-08-18 DIAGNOSIS — R1013 Epigastric pain: Secondary | ICD-10-CM

## 2022-08-18 DIAGNOSIS — K831 Obstruction of bile duct: Secondary | ICD-10-CM

## 2022-08-18 DIAGNOSIS — R1011 Right upper quadrant pain: Secondary | ICD-10-CM

## 2022-08-18 MED ORDER — GADOBUTROL 1 MMOL/ML IV SOLN
10.0000 mL | Freq: Once | INTRAVENOUS | Status: AC | PRN
  Administered 2022-08-18: 10 mL via INTRAVENOUS

## 2022-08-19 ENCOUNTER — Other Ambulatory Visit: Payer: Self-pay

## 2022-08-19 ENCOUNTER — Encounter: Payer: Self-pay | Admitting: General Surgery

## 2022-08-19 ENCOUNTER — Ambulatory Visit: Payer: Managed Care, Other (non HMO)

## 2022-08-19 ENCOUNTER — Other Ambulatory Visit: Payer: Self-pay | Admitting: General Surgery

## 2022-08-19 ENCOUNTER — Ambulatory Visit: Payer: Managed Care, Other (non HMO) | Admitting: Anesthesiology

## 2022-08-19 ENCOUNTER — Ambulatory Visit
Admission: RE | Admit: 2022-08-19 | Discharge: 2022-08-19 | Disposition: A | Discharge status: Home / Self Care | Payer: Managed Care, Other (non HMO) | Attending: General Surgery | Admitting: General Surgery

## 2022-08-19 ENCOUNTER — Encounter
Admission: RE | Disposition: A | Discharge status: Home / Self Care | Payer: Self-pay | Source: Home / Self Care | Attending: General Surgery

## 2022-08-19 ENCOUNTER — Ambulatory Visit: Payer: Self-pay | Admitting: General Surgery

## 2022-08-19 ENCOUNTER — Other Ambulatory Visit: Payer: Managed Care, Other (non HMO)

## 2022-08-19 DIAGNOSIS — K219 Gastro-esophageal reflux disease without esophagitis: Secondary | ICD-10-CM | POA: Diagnosis not present

## 2022-08-19 DIAGNOSIS — R1011 Right upper quadrant pain: Secondary | ICD-10-CM

## 2022-08-19 DIAGNOSIS — R112 Nausea with vomiting, unspecified: Secondary | ICD-10-CM

## 2022-08-19 DIAGNOSIS — K8066 Calculus of gallbladder and bile duct with acute and chronic cholecystitis without obstruction: Secondary | ICD-10-CM | POA: Insufficient documentation

## 2022-08-19 LAB — POCT PREGNANCY, URINE
Preg Test, Ur: NEGATIVE
Preg Test, Ur: NEGATIVE

## 2022-08-19 SURGERY — CHOLECYSTECTOMY, ROBOT-ASSISTED, LAPAROSCOPIC
Anesthesia: General | Site: Abdomen

## 2022-08-19 MED ORDER — MIDAZOLAM HCL 2 MG/2ML IJ SOLN
INTRAMUSCULAR | Status: AC
  Filled 2022-08-19: qty 2

## 2022-08-19 MED ORDER — ORAL CARE MOUTH RINSE: 15.0000 mL | Freq: Once | OROMUCOSAL | Status: AC

## 2022-08-19 MED ORDER — KETOROLAC TROMETHAMINE 30 MG/ML IJ SOLN
INTRAMUSCULAR | Status: DC | PRN
  Administered 2022-08-19: 30 mg via INTRAVENOUS

## 2022-08-19 MED ORDER — PROPOFOL 10 MG/ML IV BOLUS
INTRAVENOUS | Status: DC | PRN
  Administered 2022-08-19: 200 mg via INTRAVENOUS

## 2022-08-19 MED ORDER — ONDANSETRON HCL 4 MG/2ML IJ SOLN
INTRAMUSCULAR | Status: DC | PRN
  Administered 2022-08-19: 4 mg via INTRAVENOUS

## 2022-08-19 MED ORDER — ROCURONIUM BROMIDE 100 MG/10ML IV SOLN
INTRAVENOUS | Status: DC | PRN
  Administered 2022-08-19: 50 mg via INTRAVENOUS
  Administered 2022-08-19: 10 mg via INTRAVENOUS

## 2022-08-19 MED ORDER — ROCURONIUM BROMIDE 10 MG/ML (PF) SYRINGE
PREFILLED_SYRINGE | INTRAVENOUS | Status: AC
  Filled 2022-08-19: qty 10

## 2022-08-19 MED ORDER — FENTANYL CITRATE (PF) 100 MCG/2ML IJ SOLN
INTRAMUSCULAR | Status: DC | PRN
  Administered 2022-08-19: 100 ug via INTRAVENOUS
  Administered 2022-08-19: 50 ug via INTRAVENOUS

## 2022-08-19 MED ORDER — ONDANSETRON HCL 4 MG/2ML IJ SOLN
INTRAMUSCULAR | Status: AC
  Filled 2022-08-19: qty 2

## 2022-08-19 MED ORDER — BUPIVACAINE-EPINEPHRINE 0.25% -1:200000 IJ SOLN
INTRAMUSCULAR | Status: DC | PRN
  Administered 2022-08-19: 30 mL

## 2022-08-19 MED ORDER — EPINEPHRINE PF 1 MG/ML IJ SOLN
INTRAMUSCULAR | Status: AC
  Filled 2022-08-19: qty 1

## 2022-08-19 MED ORDER — LIDOCAINE HCL (CARDIAC) PF 100 MG/5ML IV SOSY
PREFILLED_SYRINGE | INTRAVENOUS | Status: DC | PRN
  Administered 2022-08-19: 100 mg via INTRAVENOUS

## 2022-08-19 MED ORDER — OXYCODONE HCL 5 MG PO TABS
5.0000 mg | ORAL_TABLET | Freq: Once | ORAL | Status: AC | PRN
  Administered 2022-08-19: 5 mg via ORAL

## 2022-08-19 MED ORDER — 0.9 % SODIUM CHLORIDE (POUR BTL) OPTIME
TOPICAL | Status: DC | PRN
  Administered 2022-08-19: 500 mL

## 2022-08-19 MED ORDER — FENTANYL CITRATE (PF) 100 MCG/2ML IJ SOLN
INTRAMUSCULAR | Status: AC
  Filled 2022-08-19: qty 2

## 2022-08-19 MED ORDER — DEXAMETHASONE SODIUM PHOSPHATE 10 MG/ML IJ SOLN
INTRAMUSCULAR | Status: DC | PRN
  Administered 2022-08-19: 6 mg via INTRAVENOUS

## 2022-08-19 MED ORDER — SODIUM CHLORIDE (PF) 0.9 % IJ SOLN
INTRAMUSCULAR | Status: DC | PRN
  Administered 2022-08-19: 10 mL

## 2022-08-19 MED ORDER — SEVOFLURANE IN SOLN
RESPIRATORY_TRACT | Status: AC
  Filled 2022-08-19: qty 250

## 2022-08-19 MED ORDER — KETOROLAC TROMETHAMINE 30 MG/ML IJ SOLN
INTRAMUSCULAR | Status: AC
  Filled 2022-08-19: qty 1

## 2022-08-19 MED ORDER — CHLORHEXIDINE GLUCONATE 0.12 % MT SOLN
15.0000 mL | Freq: Once | OROMUCOSAL | Status: AC
  Administered 2022-08-19: 15 mL via OROMUCOSAL

## 2022-08-19 MED ORDER — VANCOMYCIN HCL IN DEXTROSE 1-5 GM/200ML-% IV SOLN
1000.0000 mg | INTRAVENOUS | Status: AC
  Administered 2022-08-19: 1000 mg via INTRAVENOUS

## 2022-08-19 MED ORDER — OXYCODONE HCL 5 MG/5ML PO SOLN: 5.0000 mg | Freq: Once | ORAL | Status: AC | PRN

## 2022-08-19 MED ORDER — LACTATED RINGERS IV SOLN: INTRAVENOUS | Status: DC

## 2022-08-19 MED ORDER — VANCOMYCIN HCL IN DEXTROSE 1-5 GM/200ML-% IV SOLN
INTRAVENOUS | Status: AC
  Filled 2022-08-19: qty 200

## 2022-08-19 MED ORDER — ONDANSETRON HCL 4 MG/2ML IJ SOLN
4.0000 mg | Freq: Once | INTRAMUSCULAR | Status: AC | PRN
  Administered 2022-08-19: 4 mg via INTRAVENOUS

## 2022-08-19 MED ORDER — IOHEXOL 180 MG/ML  SOLN
INTRAMUSCULAR | Status: DC | PRN
  Administered 2022-08-19: 10 mL

## 2022-08-19 MED ORDER — PROPOFOL 10 MG/ML IV BOLUS
INTRAVENOUS | Status: AC
  Filled 2022-08-19: qty 40

## 2022-08-19 MED ORDER — INDOCYANINE GREEN 25 MG IV SOLR
1.2500 mg | Freq: Once | INTRAVENOUS | Status: AC
  Administered 2022-08-19: 1.25 mg via INTRAVENOUS
  Filled 2022-08-19: qty 0.5

## 2022-08-19 MED ORDER — ACETAMINOPHEN 10 MG/ML IV SOLN
INTRAVENOUS | Status: DC | PRN
  Administered 2022-08-19: 1000 mg via INTRAVENOUS

## 2022-08-19 MED ORDER — ACETAMINOPHEN 10 MG/ML IV SOLN
INTRAVENOUS | Status: AC
  Filled 2022-08-19: qty 100

## 2022-08-19 MED ORDER — DROPERIDOL 2.5 MG/ML IJ SOLN
0.6250 mg | Freq: Once | INTRAMUSCULAR | Status: AC | PRN
  Administered 2022-08-19: 0.625 mg via INTRAVENOUS

## 2022-08-19 MED ORDER — OXYCODONE HCL 5 MG PO TABS
ORAL_TABLET | ORAL | Status: AC
  Filled 2022-08-19: qty 1

## 2022-08-19 MED ORDER — SODIUM CHLORIDE 0.9 % IR SOLN
Status: DC | PRN
  Administered 2022-08-19: 1000 mL

## 2022-08-19 MED ORDER — SUGAMMADEX SODIUM 200 MG/2ML IV SOLN
INTRAVENOUS | Status: DC | PRN
  Administered 2022-08-19: 200 mg via INTRAVENOUS

## 2022-08-19 MED ORDER — LIDOCAINE HCL (PF) 2 % IJ SOLN
INTRAMUSCULAR | Status: AC
  Filled 2022-08-19: qty 5

## 2022-08-19 MED ORDER — BUPIVACAINE HCL (PF) 0.25 % IJ SOLN
INTRAMUSCULAR | Status: AC
  Filled 2022-08-19: qty 30

## 2022-08-19 MED ORDER — OXYCODONE-ACETAMINOPHEN 5-325 MG PO TABS: 1.0000 | ORAL_TABLET | ORAL | 0 refills | Status: DC | PRN

## 2022-08-19 MED ORDER — DROPERIDOL 2.5 MG/ML IJ SOLN
INTRAMUSCULAR | Status: AC
  Filled 2022-08-19: qty 2

## 2022-08-19 MED ORDER — MIDAZOLAM HCL 2 MG/2ML IJ SOLN
INTRAMUSCULAR | Status: DC | PRN
  Administered 2022-08-19: 2 mg via INTRAVENOUS

## 2022-08-19 MED ORDER — DEXAMETHASONE SODIUM PHOSPHATE 10 MG/ML IJ SOLN
INTRAMUSCULAR | Status: AC
  Filled 2022-08-19: qty 1

## 2022-08-19 MED ORDER — FENTANYL CITRATE (PF) 100 MCG/2ML IJ SOLN
25.0000 ug | INTRAMUSCULAR | Status: AC | PRN
  Administered 2022-08-19: 50 ug via INTRAVENOUS
  Administered 2022-08-19 (×3): 25 ug via INTRAVENOUS
  Administered 2022-08-19: 50 ug via INTRAVENOUS
  Administered 2022-08-19: 25 ug via INTRAVENOUS

## 2022-08-19 MED ORDER — CHLORHEXIDINE GLUCONATE 0.12 % MT SOLN
OROMUCOSAL | Status: AC
  Filled 2022-08-19: qty 15

## 2022-08-19 SURGICAL SUPPLY — 52 items
ADH SKN CLS APL DERMABOND .7 (GAUZE/BANDAGES/DRESSINGS) ×1
BAG PRESSURE INF REUSE 1000 (BAG) IMPLANT
BLADE SURG SZ11 CARB STEEL (BLADE) ×1 IMPLANT
CANNULA REDUCER 12-8 DVNC XI (CANNULA) ×1 IMPLANT
CATH REDDICK CHOLANGI 4FR 50CM (CATHETERS) IMPLANT
CAUTERY HOOK MNPLR 1.6 DVNC XI (INSTRUMENTS) ×1 IMPLANT
CLIP LIGATING HEM O LOK PURPLE (MISCELLANEOUS) IMPLANT
CLIP LIGATING HEMO O LOK GREEN (MISCELLANEOUS) ×1 IMPLANT
DERMABOND ADVANCED .7 DNX12 (GAUZE/BANDAGES/DRESSINGS) ×1 IMPLANT
DRAPE ARM DVNC X/XI (DISPOSABLE) ×4 IMPLANT
DRAPE C-ARM XRAY 36X54 (DRAPES) IMPLANT
DRAPE COLUMN DVNC XI (DISPOSABLE) ×1 IMPLANT
ELECT REM PT RETURN 9FT ADLT (ELECTROSURGICAL) ×1
ELECTRODE REM PT RTRN 9FT ADLT (ELECTROSURGICAL) ×1 IMPLANT
FORCEPS BPLR 8 MD DVNC XI (FORCEP) ×1 IMPLANT
FORCEPS BPLR R/ABLATION 8 DVNC (INSTRUMENTS) ×1 IMPLANT
FORCEPS PROGRASP DVNC XI (FORCEP) ×1 IMPLANT
GLOVE BIO SURGEON STRL SZ 6.5 (GLOVE) ×2 IMPLANT
GLOVE BIOGEL PI IND STRL 6.5 (GLOVE) ×2 IMPLANT
GOWN STRL REUS W/ TWL LRG LVL3 (GOWN DISPOSABLE) ×3 IMPLANT
GOWN STRL REUS W/TWL LRG LVL3 (GOWN DISPOSABLE) ×3
GRASPER SUT TROCAR 14GX15 (MISCELLANEOUS) ×1 IMPLANT
IRRIGATOR SUCT 8 DISP DVNC XI (IRRIGATION / IRRIGATOR) IMPLANT
IV CATH ANGIO 12GX3 LT BLUE (NEEDLE) IMPLANT
IV NS 1000ML (IV SOLUTION) ×1
IV NS 1000ML BAXH (IV SOLUTION) IMPLANT
KIT PINK PAD W/HEAD ARE REST (MISCELLANEOUS) ×1 IMPLANT
KIT PINK PAD W/HEAD ARM REST (MISCELLANEOUS) ×1 IMPLANT
LABEL OR SOLS (LABEL) ×1 IMPLANT
MANIFOLD NEPTUNE II (INSTRUMENTS) ×1 IMPLANT
NDL HYPO 22X1.5 SAFETY MO (MISCELLANEOUS) ×1 IMPLANT
NDL INSUFFLATION 14GA 120MM (NEEDLE) ×1 IMPLANT
NEEDLE HYPO 22X1.5 SAFETY MO (MISCELLANEOUS) ×1 IMPLANT
NEEDLE INSUFFLATION 14GA 120MM (NEEDLE) ×1 IMPLANT
NS IRRIG 500ML POUR BTL (IV SOLUTION) ×1 IMPLANT
OBTURATOR OPTICAL STND 8 DVNC (TROCAR) ×1
OBTURATOR OPTICALSTD 8 DVNC (TROCAR) ×1 IMPLANT
PACK LAP CHOLECYSTECTOMY (MISCELLANEOUS) ×1 IMPLANT
SEAL UNIV 5-12 XI (MISCELLANEOUS) ×4 IMPLANT
SET TUBE SMOKE EVAC HIGH FLOW (TUBING) ×1 IMPLANT
SOL ELECTROSURG ANTI STICK (MISCELLANEOUS) ×1
SOLUTION ELECTROSURG ANTI STCK (MISCELLANEOUS) ×1 IMPLANT
SPIKE FLUID TRANSFER (MISCELLANEOUS) ×2 IMPLANT
SPONGE T-LAP 4X18 ~~LOC~~+RFID (SPONGE) IMPLANT
SUT MNCRL 4-0 (SUTURE) ×2
SUT MNCRL 4-0 27XMFL (SUTURE) ×2
SUT VICRYL 0 UR6 27IN ABS (SUTURE) ×1 IMPLANT
SUTURE MNCRL 4-0 27XMF (SUTURE) ×1 IMPLANT
SYS BAG RETRIEVAL 10MM (BASKET) ×1
SYSTEM BAG RETRIEVAL 10MM (BASKET) ×1 IMPLANT
TRAP FLUID SMOKE EVACUATOR (MISCELLANEOUS) ×1 IMPLANT
WATER STERILE IRR 500ML POUR (IV SOLUTION) ×1 IMPLANT

## 2022-08-19 NOTE — Discharge Instructions (Addendum)
°  Diet: Resume home heart healthy regular diet.  ° °Activity: No heavy lifting >20 pounds (children, pets, laundry, garbage) or strenuous activity until follow-up, but light activity and walking are encouraged. Do not drive or drink alcohol if taking narcotic pain medications. ° °Wound care: May shower with soapy water and pat dry (do not rub incisions), but no baths or submerging incision underwater until follow-up. (no swimming)  ° °Medications: Resume all home medications. For mild to moderate pain: acetaminophen (Tylenol) or ibuprofen (if no kidney disease). Combining Tylenol with alcohol can substantially increase your risk of causing liver disease. Narcotic pain medications, if prescribed, can be used for severe pain, though may cause nausea, constipation, and drowsiness. Do not combine Tylenol and Percocet within a 6 hour period as Percocet contains Tylenol. If you do not need the narcotic pain medication, you do not need to fill the prescription. ° °Call office ([phone]) at any time if any questions, worsening pain, fevers/chills, bleeding, drainage from incision site, or other concerns. °AMBULATORY SURGERY  °DISCHARGE INSTRUCTIONS ° ° °The drugs that you were given will stay in your system until tomorrow so for the next 24 hours you should not: ° °Drive an automobile °Make any legal decisions °Drink any alcoholic beverage ° ° °You may resume regular meals tomorrow.  Today it is better to start with liquids and gradually work up to solid foods. ° °You may eat anything you prefer, but it is better to start with liquids, then soup and crackers, and gradually work up to solid foods. ° ° °Please notify your doctor immediately if you have any unusual bleeding, trouble breathing, redness and pain at the surgery site, drainage, fever, or pain not relieved by medication. ° ° ° °Additional Instructions: ° ° ° ° ° ° ° °Please contact your physician with any problems or Same Day Surgery at [phone], Monday  through Friday 6 am to 4 pm, or [place] [facility] number at [phone].  °

## 2022-08-19 NOTE — H&P (View-Only) (Signed)
PATIENT PROFILE: Gwendolyn Torres is a 52 y.o. female who presents to the Clinic for consultation at the request of [place], PA for evaluation of cholelithiasis and possible choledocholithiasis  PCP:  Scott, Charlene S, MD  HISTORY OF PRESENT ILLNESS: Gwendolyn Torres reports she started having pain 2 nights ago.  The pain was in the upper abdomen.  Pain did not radiate to other part of body.  She cannot identify any alleviating or aggravating factors.  She possibly by GI and she was found with elevated bilirubin and a leukocytosis.  She had MRCP that shows cholelithiasis without obvious sign of filling defect on the common bile duct.  I personally evaluated the images.  She has repeated labs today that shows persistent elevation of bilirubin with decrease in AST/ALT.   PROBLEM LIST: Problem List  Date Reviewed: 03/18/2022          Noted   Abnormal mammogram 04/13/2021   Overview    Formatting of this note might be different from the original. Dr Byrnett 02/05/22 - dianostic mammogram in one year.  F/u wih Dr Cintron-Diaz.   Last Assessment & Plan: Formatting of this note might be different from the original. Mammogram 01/21/21 - Birads IV.  Saw Dr Byrnett.  Recommended f/u bilateral diagnostic mammogram in one year.      Migraine 09/02/2020   Overview    Last Assessment & Plan:  Formatting of this note might be different from the original. Continue magnesium.  Seeing neurology in f/u tomorrow.      COVID-19 virus infection 03/31/2020   Overview    Last Assessment & Plan:  Formatting of this note might be different from the original. Diagnosed recently with covid.  Discussed using saline nasal spray and flonase as directed.  No significant cough or chest pain .  No sob.  Rest.  Fluids.  Discussed with ID.  No contraindication to monoclonal antibody infusion.  Call with update.      Racing heart beat, unspecified 03/25/2020   Overview    Last Assessment & Plan:  Formatting of this note might be  different from the original. Increased heart rate and heart pounding as outlined.  Wakes up with heart racing.  Has seen cardiology and neurology.  Discussed possible sleep apnea.  Refer to pulmonary.      Anemia 03/13/2019   Overview    Last Assessment & Plan:  Formatting of this note might be different from the original. Follow cbc and iron studies.      Gastroesophageal reflux disease 01/25/2019   Iron deficiency anemia due to chronic blood loss 11/23/2018   Factor V Leiden mutation (CMS/HHS-HCC) 11/13/2018   Overview    Last Assessment & Plan:  Formatting of this note might be different from the original. Heterozygous.  Evaluated by hematology.      Iron deficiency 05/06/2018   Overview    Last Assessment & Plan:  Formatting of this note might be different from the original. Off integra.  Follow cbc and iron studies.      Dizziness 11/03/2017   Overview    Last Assessment & Plan:  Formatting of this note might be different from the original. Has had extensive w/up as outlined previously.  Has seen neurology and cardiology.  She feels her current symptoms are related to her menstrual cycle.  Request iron supplements and follow.  Desires no further w/up at this time.  Follow.      Parietal lobe infarction (CMS/HHS-HCC) 11/28/2016   Meningioma (  CMS/HHS-HCC) 11/28/2016   History of CVA (cerebrovascular accident) 10/18/2016   Overview    Last Assessment & Plan:  Formatting of this note might be different from the original. Taking aspirin daily.  Recent vision change and symptoms as outlined.  Continue daily aspirin.  Has been evaluated by neurology and cardiology previously.  Discussed with cardiology.  They will work pt in for f/u to discuss if any further w/up warranted.  Has a history of migraine headaches with vision changes as outlined.  Question if recent symptoms related to occular migraines, etc.  F/u with neurology as discussed.      Right sided weakness 10/17/2016    Unresponsive episode 10/17/2016   Seizure (CMS/HHS-HCC) 10/16/2016   Health care maintenance 05/02/2015   Overview    Overview:  Mammogram 09/02/16 - Birads 0.  Recommended f/u left breast mammogram.  Unilateral f/u left breast mammogram 09/09/16 - Birads I.    Last Assessment & Plan:  Physical today 05/01/16.  PAP 05/01/15 - negative with scan cellularity (satisfactory for evaluation).  Negative HPV.  Mammogram 07/03/15 - birads I      Arm skin lesion, left 05/01/2015   Overview    Last Assessment & Plan:  Persistent arm lesion.  Refer to dermatology.        Foot pain, right 02/03/2015   Overview    Last Assessment & Plan:  Persistent issues with her foot.  Saw podiatry.  Planning for surgery as outlined.  Currently dong well.  I feel that she is a low risk from a cardiac standpoint to proceed with surgery.  I recommend close intra op and post op monitoring of her heart rated and blood pressure to avoid extremes.        Pre-op evaluation 02/03/2015   Overview    Last Assessment & Plan:  Planning for foot surgery.  Seeing podiatry.  She will drop off form when knows date of surgery.  I do feel she is at low risk from a cardiac standpoint to proceed with the planned surgery.  Will need close intra op and post op monitoring of her heart rate and blood pressure to avoid extremes.        Osteoarthrosis involving lower leg 06/09/2012   Performance anxiety 06/09/2012   Overview    Last Assessment & Plan:  Now on wellbutrin 300mg q day.  Doing well.  Follow.        Situational anxiety 06/09/2012   Overview    Last Assessment & Plan:  Increased stress as outlined.  Overall handling things relatively well.  Has good support.  Continue wellbutrin.  Follow.         GENERAL REVIEW OF SYSTEMS:   General ROS: negative for - chills, fatigue, fever, weight gain or weight loss Allergy and Immunology ROS: negative for - hives  Hematological and Lymphatic ROS: negative for - bleeding problems or  bruising, negative for palpable nodes Endocrine ROS: negative for - heat or cold intolerance, hair changes Respiratory ROS: negative for - cough, shortness of breath or wheezing Cardiovascular ROS: no chest pain or palpitations GI ROS: Positive for nausea, vomiting, abdominal pain Musculoskeletal ROS: negative for - joint swelling or muscle pain Neurological ROS: negative for - confusion, syncope Dermatological ROS: negative for pruritus and rash Psychiatric: negative for anxiety, depression, difficulty sleeping and memory loss  MEDICATIONS: Current Outpatient Medications  Medication Sig Dispense Refill   aspirin 81 MG EC tablet Take 81 mg by mouth once daily.         cholecalciferol (VITAMIN D3) 1000 unit tablet Take by mouth once daily        ciclopirox (PENLAC) 8 % topical nail solution Apply topically at bedtime Apply over nail and surrounding skin. 6.6 mL 3   ciprofloxacin HCl (CIPRO) 500 MG tablet Take 1 tablet (500 mg total) by mouth 2 (two) times daily for 7 days 14 tablet 0   escitalopram oxalate (LEXAPRO) 10 MG tablet      fluticasone propionate (FLONASE) 50 mcg/actuation nasal spray Place 1 spray into both nostrils 2 (two) times daily 16 g 0   magnesium oxide (MAG-OX) 400 mg (241.3 mg magnesium) tablet Take by mouth Take 400 mg by mouth daily     metroNIDAZOLE (FLAGYL) 500 MG tablet Take 1 tablet (500 mg total) by mouth every 8 (eight) hours for 7 days 21 tablet 0   MOUNJARO 5 mg/0.5 mL PnIj Inject 5 mg into the skin once a week.     omeprazole (PRILOSEC) 20 MG DR capsule Take 1 capsule (20 mg total) by mouth 2 (two) times daily 120 capsule 1   rosuvastatin (CRESTOR) 5 MG tablet Take 5 mg by mouth once daily     sucralfate (CARAFATE) 1 gram tablet Take 1 tablet (1 g total) by mouth 2 (two) times daily before meals Dissolve tablet in 1 Tbsp water to create slurry. 60 tablet 11   No current facility-administered medications for this visit.    ALLERGIES: Penicillins and Sulfa  (sulfonamide antibiotics)  PAST MEDICAL HISTORY: Past Medical History:  Diagnosis Date   GERD (gastroesophageal reflux disease)    Iron deficiency anemia due to chronic blood loss 11/23/2018   Migraine headache    Seizures (CMS/HHS-HCC)    Stroke (CMS/HHS-HCC)     PAST SURGICAL HISTORY: Past Surgical History:  Procedure Laterality Date   EGD  09/04/2016   No repeat per RTE   COLONOSCOPY  05/17/2020   Normal colon biopsies/Repeat 10yrs/CTL   CESAREAN SECTION     foot surgery 2018       FAMILY HISTORY: Family History  Problem Relation Name Age of Onset   Heart disease Mother     Stroke Mother     Aneurysm Mother     Hyperlipidemia (Elevated cholesterol) Mother     High blood pressure (Hypertension) Mother     Heart disease Father     Kidney disease Father     Diabetes type II Father     Ulcers Father     Rheum arthritis Father     Gout Father     Coronary Artery Disease (Blocked arteries around heart) Father     Diabetes Father     Hyperlipidemia (Elevated cholesterol) Father     High blood pressure (Hypertension) Father     Diabetes type II Sister     Hyperlipidemia (Elevated cholesterol) Sister     Diabetes Sister     Hyperlipidemia (Elevated cholesterol) Sister     Cancer Sister  62   Pancreatic cancer Sister  62       Died within 4 months of diagnosis   Hyperlipidemia (Elevated cholesterol) Brother     Breast cancer Maternal Aunt     Melanoma Maternal Uncle     Breast cancer Cousin     Colon cancer Neg Hx       SOCIAL HISTORY: Social History   Socioeconomic History   Marital status: Married  Tobacco Use   Smoking status: Never   Smokeless tobacco: Never  Vaping Use   Vaping   status: Never Used  Substance and Sexual Activity   Alcohol use: No   Drug use: No   Sexual activity: Yes    Partners: Male  Social History Narrative   Education: na   Occupation: na   Hobbies: na   Marital Status: married   Social Determinants of Health   Financial  Resource Strain: Low Risk  (01/15/2021)   Received from [facility], [place]   Overall Financial Resource Strain (CARDIA)    Difficulty of Paying Living Expenses: Not hard at all    PHYSICAL EXAM: Vitals:   08/19/22 1027  BP: 125/87  Pulse: 104   Body mass index is 35.4 kg/m. Weight: (!) 102.5 kg (226 lb)   GENERAL: Alert, active, oriented x3  HEENT: Pupils equal reactive to light. Extraocular movements are intact. Sclera clear. Palpebral conjunctiva normal red color.Pharynx clear.  NECK: Supple with no palpable mass and no adenopathy.  LUNGS: Sound clear with no rales rhonchi or wheezes.  HEART: Regular rhythm S1 and S2 without murmur.  ABDOMEN: Soft and depressible, nontender with no palpable mass, no hepatomegaly.   EXTREMITIES: Well-developed well-nourished symmetrical with no dependent edema.  NEUROLOGICAL: Awake alert oriented, facial expression symmetrical, moving all extremities.  REVIEW OF DATA: I have reviewed the following data today: Appointment on 08/19/2022  Component Date Value   WBC (White Blood Cell Co* 08/19/2022 6.7    RBC (Red Blood Cell Coun* 08/19/2022 4.71    Hemoglobin 08/19/2022 13.4    Hematocrit 08/19/2022 39.7    MCV (Mean Corpuscular Vo* 08/19/2022 84.3    MCH (Mean Corpuscular He* 08/19/2022 28.5    MCHC (Mean Corpuscular H* 08/19/2022 33.8    Platelet Count 08/19/2022 241    RDW-CV (Red Cell Distrib* 08/19/2022 13.9    MPV (Mean Platelet Volum* 08/19/2022 10.2    Neutrophils 08/19/2022 5.07    Lymphocytes 08/19/2022 0.61 (L)    Monocytes 08/19/2022 0.75    Eosinophils 08/19/2022 0.22    Basophils 08/19/2022 0.03    Neutrophil % 08/19/2022 75.4 (H)    Lymphocyte % 08/19/2022 9.1 (L)    Monocyte % 08/19/2022 11.2    Eosinophil % 08/19/2022 3.3    Basophil% 08/19/2022 0.4    Immature Granulocyte % 08/19/2022 0.6    Immature Granulocyte Cou* 08/19/2022 0.04    Protein, Total 08/19/2022 7.2    Albumin 08/19/2022 4.1     Bilirubin, Total 08/19/2022 6.4 (H)    Bilirubin, Conjugated 08/19/2022 4.75 (H)    Alk Phos (alkaline Phosp* 08/19/2022 227 (H)    AST  08/19/2022 215 (H)    ALT  08/19/2022 499 (H)   Appointment on 08/18/2022  Component Date Value   Protein, Total 08/18/2022 6.9    Albumin 08/18/2022 3.9    Bilirubin, Total 08/18/2022 6.6 (H)    Bilirubin, Conjugated 08/18/2022 4.71 (H)    Alk Phos (alkaline Phosp* 08/18/2022 195 (H)    AST  08/18/2022 482 (H)    ALT  08/18/2022 740 (H)    Glucose 08/18/2022 106    Sodium 08/18/2022 136    Potassium 08/18/2022 3.9    Chloride 08/18/2022 103    Carbon Dioxide (CO2) 08/18/2022 25.7    Calcium 08/18/2022 9.1    Urea Nitrogen (BUN) 08/18/2022 16    Creatinine 08/18/2022 1.0    Glomerular Filtration Ra* 08/18/2022 69    BUN/Crea Ratio 08/18/2022 16.0    Anion Gap w/K 08/18/2022 11.2    WBC (White Blood Cell Co* 08/18/2022 11.4 (H)      RBC (Red Blood Cell Coun* 08/18/2022 4.49    Hemoglobin 08/18/2022 12.8    Hematocrit 08/18/2022 38.2    MCV (Mean Corpuscular Vo* 08/18/2022 85.1    MCH (Mean Corpuscular He* 08/18/2022 28.5    MCHC (Mean Corpuscular H* 08/18/2022 33.5    Platelet Count 08/18/2022 237    RDW-CV (Red Cell Distrib* 08/18/2022 13.9    MPV (Mean Platelet Volum* 08/18/2022 10.1    Neutrophils 08/18/2022 10.02 (H)    Lymphocytes 08/18/2022 0.63 (L)    Monocytes 08/18/2022 0.62    Eosinophils 08/18/2022 0.05    Basophils 08/18/2022 0.03    Neutrophil % 08/18/2022 88.0 (H)    Lymphocyte % 08/18/2022 5.5 (L)    Monocyte % 08/18/2022 5.4    Eosinophil % 08/18/2022 0.4 (L)    Basophil% 08/18/2022 0.3    Immature Granulocyte % 08/18/2022 0.4    Immature Granulocyte Cou* 08/18/2022 0.05      ASSESSMENT: Gwendolyn Torres is a 52 y.o. female presenting for consultation for cholelithiasis with possible choledocholithiasis.    Patient was oriented about the diagnosis of cholelithiasis with elevated bilirubin levels.  Due to the persistent  elevated bilirubin level and no obvious choledocholithiasis on MRCP I think the patient would benefit of cholecystectomy with cholangiogram.  I worry we can evaluate if there is persistent stone in the common bile duct or if common bile duct is clear most likely is that the stone passed and she is still have delayed in resolution of the bilirubin levels on labs.  If she has choledocholithiasis I would recommend to stay for possible ERCP.  Surgical technique (open vs laparoscopic) was discussed. It was also discussed the goals of the surgery (decrease the pain episodes and avoid the risk of cholecystitis) and the risk of surgery including: bleeding, infection, common bile duct injury, stone retention, injury to other organs such as bowel, liver, stomach, other complications such as hernia, bowel obstruction among others. Also discussed with patient about anesthesia and its complications such as: reaction to medications, pneumonia, heart complications, death, among others.   Cholelithiasis with choledocholithiasis [K80.70]  PLAN: 1.  Robotic assisted laparoscopic cholecystectomy with intraoperative cholangiogram ([id number])  Patient verbalized understanding, all questions were answered, and were agreeable with the plan outlined above.   Citlalli Weikel Cintron-Diaz, MD  Electronically signed by Illyanna Petillo Cintron-Diaz, MD  

## 2022-08-19 NOTE — Anesthesia Procedure Notes (Signed)
Procedure Name: Intubation Date/Time: 08/19/2022 2:01 PM  Performed by: Emeterio Reeve, CRNAPre-anesthesia Checklist: Patient identified, Emergency Drugs available, Suction available and Patient being monitored Patient Re-evaluated:Patient Re-evaluated prior to induction Oxygen Delivery Method: Circle system utilized Preoxygenation: Pre-oxygenation with 100% oxygen Induction Type: IV induction Ventilation: Mask ventilation without difficulty Laryngoscope Size: Mac and 4 Grade View: Grade II Tube type: Oral Tube size: 7.0 mm Number of attempts: 1 Airway Equipment and Method: Stylet and Oral airway Placement Confirmation: ETT inserted through vocal cords under direct vision, positive ETCO2 and breath sounds checked- equal and bilateral Secured at: 22 cm Tube secured with: Tape Dental Injury: Teeth and Oropharynx as per pre-operative assessment  Comments: Grade 2a view with cricoid pressure; anterior larynx. Easy mask. CA

## 2022-08-19 NOTE — Interval H&P Note (Signed)
History and Physical Interval Note:  08/19/2022 1:33 PM  Gwendolyn Torres  has presented today for surgery, with the diagnosis of K80.70 cholelithiasis w/ choledocholithiasis.  The various methods of treatment have been discussed with the patient and family. After consideration of risks, benefits and other options for treatment, the patient has consented to  Procedure(s): XI ROBOTIC ASSISTED LAPAROSCOPIC CHOLECYSTECTOMYw/ cholangiogram (N/A) as a surgical intervention.  The patient's history has been reviewed, patient examined, no change in status, stable for surgery.  I have reviewed the patient's chart and labs.  Questions were answered to the patient's satisfaction.     Carolan Shiver

## 2022-08-19 NOTE — H&P (Signed)
PATIENT PROFILE: Gwendolyn Torres is a 52 y.o. female who presents to the Clinic for consultation at the request of Croley, PA for evaluation of cholelithiasis and possible choledocholithiasis  PCP:  Charm Barges, MD  HISTORY OF PRESENT ILLNESS: Gwendolyn Torres reports she started having pain 2 nights ago.  The pain was in the upper abdomen.  Pain did not radiate to other part of body.  She cannot identify any alleviating or aggravating factors.  She possibly by GI and she was found with elevated bilirubin and a leukocytosis.  She had MRCP that shows cholelithiasis without obvious sign of filling defect on the common bile duct.  I personally evaluated the images.  She has repeated labs today that shows persistent elevation of bilirubin with decrease in AST/ALT.   PROBLEM LIST: Problem List  Date Reviewed: 03/18/2022          Noted   Abnormal mammogram 04/13/2021   Overview    Formatting of this note might be different from the original. Dr Lemar Livings 02/05/22 - dianostic mammogram in one year.  F/u wih Dr Hazle Quant.   Last Assessment & Plan: Formatting of this note might be different from the original. Mammogram 01/21/21 - Birads IV.  Saw Dr Lemar Livings.  Recommended f/u bilateral diagnostic mammogram in one year.      Migraine 09/02/2020   Overview    Last Assessment & Plan:  Formatting of this note might be different from the original. Continue magnesium.  Seeing neurology in f/u tomorrow.      COVID-19 virus infection 03/31/2020   Overview    Last Assessment & Plan:  Formatting of this note might be different from the original. Diagnosed recently with covid.  Discussed using saline nasal spray and flonase as directed.  No significant cough or chest pain .  No sob.  Rest.  Fluids.  Discussed with ID.  No contraindication to monoclonal antibody infusion.  Call with update.      Racing heart beat, unspecified 03/25/2020   Overview    Last Assessment & Plan:  Formatting of this note might be  different from the original. Increased heart rate and heart pounding as outlined.  Wakes up with heart racing.  Has seen cardiology and neurology.  Discussed possible sleep apnea.  Refer to pulmonary.      Anemia 03/13/2019   Overview    Last Assessment & Plan:  Formatting of this note might be different from the original. Follow cbc and iron studies.      Gastroesophageal reflux disease 01/25/2019   Iron deficiency anemia due to chronic blood loss 11/23/2018   Factor V Leiden mutation (CMS/HHS-HCC) 11/13/2018   Overview    Last Assessment & Plan:  Formatting of this note might be different from the original. Heterozygous.  Evaluated by hematology.      Iron deficiency 05/06/2018   Overview    Last Assessment & Plan:  Formatting of this note might be different from the original. Off integra.  Follow cbc and iron studies.      Dizziness 11/03/2017   Overview    Last Assessment & Plan:  Formatting of this note might be different from the original. Has had extensive w/up as outlined previously.  Has seen neurology and cardiology.  She feels her current symptoms are related to her menstrual cycle.  Request iron supplements and follow.  Desires no further w/up at this time.  Follow.      Parietal lobe infarction (CMS/HHS-HCC) 11/28/2016   Meningioma (  CMS/HHS-HCC) 11/28/2016   History of CVA (cerebrovascular accident) 10/18/2016   Overview    Last Assessment & Plan:  Formatting of this note might be different from the original. Taking aspirin daily.  Recent vision change and symptoms as outlined.  Continue daily aspirin.  Has been evaluated by neurology and cardiology previously.  Discussed with cardiology.  They will work pt in for f/u to discuss if any further w/up warranted.  Has a history of migraine headaches with vision changes as outlined.  Question if recent symptoms related to occular migraines, etc.  F/u with neurology as discussed.      Right sided weakness 10/17/2016    Unresponsive episode 10/17/2016   Seizure (CMS/HHS-HCC) 10/16/2016   Health care maintenance 05/02/2015   Overview    Overview:  Mammogram 09/02/16 - Birads 0.  Recommended f/u left breast mammogram.  Unilateral f/u left breast mammogram 09/09/16 - Birads I.    Last Assessment & Plan:  Physical today 05/01/16.  PAP 05/01/15 - negative with scan cellularity (satisfactory for evaluation).  Negative HPV.  Mammogram 07/03/15 - birads I      Arm skin lesion, left 05/01/2015   Overview    Last Assessment & Plan:  Persistent arm lesion.  Refer to dermatology.        Foot pain, right 02/03/2015   Overview    Last Assessment & Plan:  Persistent issues with her foot.  Saw podiatry.  Planning for surgery as outlined.  Currently dong well.  I feel that she is a low risk from a cardiac standpoint to proceed with surgery.  I recommend close intra op and post op monitoring of her heart rated and blood pressure to avoid extremes.        Pre-op evaluation 02/03/2015   Overview    Last Assessment & Plan:  Planning for foot surgery.  Seeing podiatry.  She will drop off form when knows date of surgery.  I do feel she is at low risk from a cardiac standpoint to proceed with the planned surgery.  Will need close intra op and post op monitoring of her heart rate and blood pressure to avoid extremes.        Osteoarthrosis involving lower leg 06/09/2012   Performance anxiety 06/09/2012   Overview    Last Assessment & Plan:  Now on wellbutrin  q day.  Doing well.  Follow.        Situational anxiety 06/09/2012   Overview    Last Assessment & Plan:  Increased stress as outlined.  Overall handling things relatively well.  Has good support.  Continue wellbutrin.  Follow.         GENERAL REVIEW OF SYSTEMS:   General ROS: negative for - chills, fatigue, fever, weight gain or weight loss Allergy and Immunology ROS: negative for - hives  Hematological and Lymphatic ROS: negative for - bleeding problems or  bruising, negative for palpable nodes Endocrine ROS: negative for - heat or cold intolerance, hair changes Respiratory ROS: negative for - cough, shortness of breath or wheezing Cardiovascular ROS: no chest pain or palpitations GI ROS: Positive for nausea, vomiting, abdominal pain Musculoskeletal ROS: negative for - joint swelling or muscle pain Neurological ROS: negative for - confusion, syncope Dermatological ROS: negative for pruritus and rash Psychiatric: negative for anxiety, depression, difficulty sleeping and memory loss  MEDICATIONS: Current Outpatient Medications  Medication Sig Dispense Refill   aspirin 81 MG EC tablet Take 81 mg by mouth once daily.  cholecalciferol (VITAMIN D3) 1000 unit tablet Take by mouth once daily        ciclopirox (PENLAC) 8 % topical nail solution Apply topically at bedtime Apply over nail and surrounding skin. 6.6 mL 3   ciprofloxacin HCl (CIPRO) 500 MG tablet Take 1 tablet (500 mg total) by mouth 2 (two) times daily for 7 days 14 tablet 0   escitalopram oxalate (LEXAPRO) 10 MG tablet      fluticasone propionate (FLONASE) 50 mcg/actuation nasal spray Place 1 spray into both nostrils 2 (two) times daily 16 g 0   magnesium oxide (MAG-OX) 400 mg (241.3 mg magnesium) tablet Take by mouth Take 400 mg by mouth daily     metroNIDAZOLE (FLAGYL) 500 MG tablet Take 1 tablet (500 mg total) by mouth every 8 (eight) hours for 7 days 21 tablet 0   MOUNJARO 5 mg/0.5 mL PnIj Inject 5 mg into the skin once a week.     omeprazole (PRILOSEC) 20 MG DR capsule Take 1 capsule (20 mg total) by mouth 2 (two) times daily 120 capsule 1   rosuvastatin (CRESTOR) 5 MG tablet Take 5 mg by mouth once daily     sucralfate (CARAFATE) 1 gram tablet Take 1 tablet (1 g total) by mouth 2 (two) times daily before meals Dissolve tablet in 1 Tbsp water to create slurry. 60 tablet 11   No current facility-administered medications for this visit.    ALLERGIES: Penicillins and Sulfa  (sulfonamide antibiotics)  PAST MEDICAL HISTORY: Past Medical History:  Diagnosis Date   GERD (gastroesophageal reflux disease)    Iron deficiency anemia due to chronic blood loss 11/23/2018   Migraine headache    Seizures (CMS/HHS-HCC)    Stroke (CMS/HHS-HCC)     PAST SURGICAL HISTORY: Past Surgical History:  Procedure Laterality Date   EGD  09/04/2016   No repeat per RTE   COLONOSCOPY  05/17/2020   Normal colon biopsies/Repeat 7yrs/CTL   CESAREAN SECTION     foot surgery 2018       FAMILY HISTORY: Family History  Problem Relation Name Age of Onset   Heart disease Mother     Stroke Mother     Aneurysm Mother     Hyperlipidemia (Elevated cholesterol) Mother     High blood pressure (Hypertension) Mother     Heart disease Father     Kidney disease Father     Diabetes type II Father     Ulcers Father     Rheum arthritis Father     Gout Father     Coronary Artery Disease (Blocked arteries around heart) Father     Diabetes Father     Hyperlipidemia (Elevated cholesterol) Father     High blood pressure (Hypertension) Father     Diabetes type II Sister     Hyperlipidemia (Elevated cholesterol) Sister     Diabetes Sister     Hyperlipidemia (Elevated cholesterol) Sister     Cancer Sister  32   Pancreatic cancer Sister  51       Died within 4 months of diagnosis   Hyperlipidemia (Elevated cholesterol) Brother     Breast cancer Maternal Aunt     Melanoma Maternal Uncle     Breast cancer Cousin     Colon cancer Neg Hx       SOCIAL HISTORY: Social History   Socioeconomic History   Marital status: Married  Tobacco Use   Smoking status: Never   Smokeless tobacco: Never  Vaping Use   Vaping  status: Never Used  Substance and Sexual Activity   Alcohol use: No   Drug use: No   Sexual activity: Yes    Partners: Male  Social History Narrative   Education: na   Occupation: na   Hobbies: na   Marital Status: married   Social Determinants of Research scientist (physical sciences) Strain: Low Risk  (01/15/2021)   Received from [facility], [place]   Overall Financial Resource Strain (CARDIA)    Difficulty of Paying Living Expenses: Not hard at all    PHYSICAL EXAM: Vitals:   08/19/22 1027  BP: 125/87  Pulse: 104   Body mass index is 35.4 kg/m. Weight: (!) 102.5 kg (226 lb)   GENERAL: Alert, active, oriented x3  HEENT: Pupils equal reactive to light. Extraocular movements are intact. Sclera clear. Palpebral conjunctiva normal red color.Pharynx clear.  NECK: Supple with no palpable mass and no adenopathy.  LUNGS: Sound clear with no rales rhonchi or wheezes.  HEART: Regular rhythm S1 and S2 without murmur.  ABDOMEN: Soft and depressible, nontender with no palpable mass, no hepatomegaly.   EXTREMITIES: Well-developed well-nourished symmetrical with no dependent edema.  NEUROLOGICAL: Awake alert oriented, facial expression symmetrical, moving all extremities.  REVIEW OF DATA: I have reviewed the following data today: Appointment on 08/19/2022  Component Date Value   WBC (White Blood Cell Co* 08/19/2022 6.7    RBC (Red Blood Cell Coun* 08/19/2022 4.71    Hemoglobin 08/19/2022 13.4    Hematocrit 08/19/2022 39.7    MCV (Mean Corpuscular Vo* 08/19/2022 84.3    MCH (Mean Corpuscular He* 08/19/2022 28.5    MCHC (Mean Corpuscular H* 08/19/2022 33.8    Platelet Count 08/19/2022 241    RDW-CV (Red Cell Distrib* 08/19/2022 13.9    MPV (Mean Platelet Volum* 08/19/2022 10.2    Neutrophils 08/19/2022 5.07    Lymphocytes 08/19/2022 0.61 (L)    Monocytes 08/19/2022 0.75    Eosinophils 08/19/2022 0.22    Basophils 08/19/2022 0.03    Neutrophil % 08/19/2022 75.4 (H)    Lymphocyte % 08/19/2022 9.1 (L)    Monocyte % 08/19/2022 11.2    Eosinophil % 08/19/2022 3.3    Basophil% 08/19/2022 0.4    Immature Granulocyte % 08/19/2022 0.6    Immature Granulocyte Cou* 08/19/2022 0.04    Protein, Total 08/19/2022 7.2    Albumin 08/19/2022 4.1     Bilirubin, Total 08/19/2022 6.4 (H)    Bilirubin, Conjugated 08/19/2022 4.75 (H)    Alk Phos (alkaline Phosp* 08/19/2022 227 (H)    AST  08/19/2022 215 (H)    ALT  08/19/2022 499 (H)   Appointment on 08/18/2022  Component Date Value   Protein, Total 08/18/2022 6.9    Albumin 08/18/2022 3.9    Bilirubin, Total 08/18/2022 6.6 (H)    Bilirubin, Conjugated 08/18/2022 4.71 (H)    Alk Phos (alkaline Phosp* 08/18/2022 195 (H)    AST  08/18/2022 482 (H)    ALT  08/18/2022 740 (H)    Glucose 08/18/2022 106    Sodium 08/18/2022 136    Potassium 08/18/2022 3.9    Chloride 08/18/2022 103    Carbon Dioxide (CO2) 08/18/2022 25.7    Calcium 08/18/2022 9.1    Urea Nitrogen (BUN) 08/18/2022 16    Creatinine 08/18/2022 1.0    Glomerular Filtration Ra* 08/18/2022 69    BUN/Crea Ratio 08/18/2022 16.0    Anion Gap w/K 08/18/2022 11.2    WBC (White Blood Cell Co* 08/18/2022 11.4 (H)  RBC (Red Blood Cell Coun* 08/18/2022 4.49    Hemoglobin 08/18/2022 12.8    Hematocrit 08/18/2022 38.2    MCV (Mean Corpuscular Vo* 08/18/2022 85.1    MCH (Mean Corpuscular He* 08/18/2022 28.5    MCHC (Mean Corpuscular H* 08/18/2022 33.5    Platelet Count 08/18/2022 237    RDW-CV (Red Cell Distrib* 08/18/2022 13.9    MPV (Mean Platelet Volum* 08/18/2022 10.1    Neutrophils 08/18/2022 10.02 (H)    Lymphocytes 08/18/2022 0.63 (L)    Monocytes 08/18/2022 0.62    Eosinophils 08/18/2022 0.05    Basophils 08/18/2022 0.03    Neutrophil % 08/18/2022 88.0 (H)    Lymphocyte % 08/18/2022 5.5 (L)    Monocyte % 08/18/2022 5.4    Eosinophil % 08/18/2022 0.4 (L)    Basophil% 08/18/2022 0.3    Immature Granulocyte % 08/18/2022 0.4    Immature Granulocyte Cou* 08/18/2022 0.05      ASSESSMENT: Gwendolyn Torres is a 52 y.o. female presenting for consultation for cholelithiasis with possible choledocholithiasis.    Patient was oriented about the diagnosis of cholelithiasis with elevated bilirubin levels.  Due to the persistent  elevated bilirubin level and no obvious choledocholithiasis on MRCP I think the patient would benefit of cholecystectomy with cholangiogram.  I worry we can evaluate if there is persistent stone in the common bile duct or if common bile duct is clear most likely is that the stone passed and she is still have delayed in resolution of the bilirubin levels on labs.  If she has choledocholithiasis I would recommend to stay for possible ERCP.  Surgical technique (open vs laparoscopic) was discussed. It was also discussed the goals of the surgery (decrease the pain episodes and avoid the risk of cholecystitis) and the risk of surgery including: bleeding, infection, common bile duct injury, stone retention, injury to other organs such as bowel, liver, stomach, other complications such as hernia, bowel obstruction among others. Also discussed with patient about anesthesia and its complications such as: reaction to medications, pneumonia, heart complications, death, among others.   Cholelithiasis with choledocholithiasis [K80.70]  PLAN: 1.  Robotic assisted laparoscopic cholecystectomy with intraoperative cholangiogram (16109)  Patient verbalized understanding, all questions were answered, and were agreeable with the plan outlined above.   Carolan Shiver, MD  Electronically signed by Carolan Shiver, MD

## 2022-08-19 NOTE — Transfer of Care (Signed)
Immediate Anesthesia Transfer of Care Note  Patient: Gwendolyn Torres  Procedure(s) Performed: XI ROBOTIC ASSISTED LAPAROSCOPIC CHOLECYSTECTOMYw/ cholangiogram (Abdomen)  Patient Location: PACU  Anesthesia Type:General  Level of Consciousness: awake, drowsy, and patient cooperative  Airway & Oxygen Therapy: Patient Spontanous Breathing and Patient connected to face mask oxygen  Post-op Assessment: Report given to RN and Post -op Vital signs reviewed and stable  Post vital signs: Reviewed and stable  Last Vitals:  Vitals Value Taken Time  BP 130/82 08/19/22 1550  Temp 36.3 C 08/19/22 1549  Pulse 88 08/19/22 1554  Resp 16 08/19/22 1554  SpO2 100 % 08/19/22 1554  Vitals shown include unvalidated device data.  Last Pain:  Vitals:   08/19/22 1549  TempSrc:   PainSc: Asleep         Complications: No notable events documented.

## 2022-08-19 NOTE — Anesthesia Postprocedure Evaluation (Signed)
Anesthesia Post Note  Patient: Gwendolyn Torres  Procedure(s) Performed: XI ROBOTIC ASSISTED LAPAROSCOPIC CHOLECYSTECTOMYw/ cholangiogram (Abdomen)  Patient location during evaluation: PACU Anesthesia Type: General Level of consciousness: awake and alert, oriented and patient cooperative Pain management: pain level controlled Vital Signs Assessment: post-procedure vital signs reviewed and stable Respiratory status: spontaneous breathing, nonlabored ventilation and respiratory function stable Cardiovascular status: blood pressure returned to baseline and stable Postop Assessment: adequate PO intake Anesthetic complications: no   No notable events documented.   Last Vitals:  Vitals:   08/19/22 1645 08/19/22 1650  BP:    Pulse: 83 80  Resp: 16 18  Temp:    SpO2: 95% 95%    Last Pain:  Vitals:   08/19/22 1650  TempSrc:   PainSc: 3                  Reed Breech

## 2022-08-19 NOTE — Anesthesia Preprocedure Evaluation (Signed)
Anesthesia Evaluation  Patient identified by MRN, date of birth, ID band Patient awake    Reviewed: Allergy & Precautions, NPO status , Patient's Chart, lab work & pertinent test results  History of Anesthesia Complications Negative for: history of anesthetic complications  Airway Mallampati: III  TM Distance: <3 FB Neck ROM: full    Dental  (+) Chipped   Pulmonary neg pulmonary ROS, neg shortness of breath   Pulmonary exam normal        Cardiovascular (-) angina (-) Past MI negative cardio ROS Normal cardiovascular exam     Neuro/Psych  Headaches  Anxiety     CVA  negative psych ROS   GI/Hepatic Neg liver ROS,GERD  Controlled,,  Endo/Other  negative endocrine ROS    Renal/GU      Musculoskeletal   Abdominal   Peds  Hematology negative hematology ROS (+)   Anesthesia Other Findings Past Medical History: No date: GERD (gastroesophageal reflux disease)  Past Surgical History: 09/25/2016: Arbutus Leas; Right     Comment:  Procedure: lapidus type  modified mcbride;  Surgeon:               Recardo Evangelist, DPM;  Location: [facility];                Service: Podiatry;  Laterality: Right; 2004: CESAREAN SECTION 02/19/2017: LOOP RECORDER INSERTION; N/A     Comment:  Procedure: LOOP RECORDER INSERTION;  Surgeon: Marcina Millard, MD;  Location: [facility] INVASIVE CV LAB;  Service:              Cardiovascular;  Laterality: N/A; 09/25/2016: OSTECTOMY; Right     Comment:  Procedure: OSTECTOMY RT  1SR,2ND 3RD  metatarsal;                Surgeon: Recardo Evangelist, DPM;  Location: [facility];  Service: Podiatry;  Laterality: Right;  LMA LOCAL 10/28/2016: TEE WITHOUT CARDIOVERSION; N/A     Comment:  Procedure: TRANSESOPHAGEAL ECHOCARDIOGRAM (TEE);                Surgeon: Dalia Heading, MD;  Location: [facility] ORS;                Service: Cardiovascular;  Laterality: N/A;  BMI     Body Mass Index: 36.34 kg/m      Reproductive/Obstetrics negative OB ROS                             Anesthesia Physical Anesthesia Plan  ASA: 3  Anesthesia Plan: General ETT   Post-op Pain Management:    Induction: Intravenous  PONV Risk Score and Plan: Ondansetron, Dexamethasone, Midazolam and Treatment may vary due to age or medical condition  Airway Management Planned: Oral ETT  Additional Equipment:   Intra-op Plan:   Post-operative Plan: Extubation in OR  Informed Consent: I have reviewed the patients History and Physical, chart, labs and discussed the procedure including the risks, benefits and alternatives for the proposed anesthesia with the patient or authorized representative who has indicated his/her understanding and acceptance.     Dental Advisory Given  Plan Discussed with: Anesthesiologist, CRNA and Surgeon  Anesthesia Plan Comments: (Patient consented for risks of anesthesia including but not limited to:  - adverse reactions to medications - damage to eyes, teeth,  lips or other oral mucosa - nerve damage due to positioning  - sore throat or hoarseness - Damage to heart, brain, nerves, lungs, other parts of body or loss of life  Patient voiced understanding.)       Anesthesia Quick Evaluation

## 2022-08-19 NOTE — Op Note (Signed)
Preoperative diagnosis: Acute cholecystitis with choledocholithiasis  Postoperative diagnosis: Acute cholecystitis  Procedure: Robotic Assisted Laparoscopic Cholecystectomy with intraoperative cholangiogram.   Anesthesia: GETA   Surgeon: Dr. Hazle Quant  Wound Classification: Clean Contaminated  Indications: Patient is a 52 y.o. female developed right upper quadrant pain, nausea and on workup was found to have cholelithiasis with a normal common duct but elevated bilirubin. MRCP negative for feeling defect of the common bile duct. Due to persistent elevated bilirubin, cholecystectomy with intraoperative cholangiogram was needed to rule out choledocholithiasis.  Findings: No filling defect identified on cholangiogram Critical view of safety achieved Cystic duct and artery identified, ligated and divided Adequate hemostasis  Description of procedure: The patient was placed on the operating table in the supine position. General anesthesia was induced. A time-out was completed verifying correct patient, procedure, site, positioning, and implant(s) and/or special equipment prior to beginning this procedure. An orogastric tube was placed. The abdomen was prepped and draped in the usual sterile fashion.  An incision was made in a natural skin line below the umbilicus.  The fascia was elevated and the Veress needle inserted. Proper position was confirmed by aspiration and saline meniscus test.  The abdomen was insufflated with carbon dioxide to a pressure of 15 mmHg. The patient tolerated insufflation well. A 8-mm trocar was then inserted in optiview fashion.  The laparoscope was inserted and the abdomen inspected. No injuries from initial trocar placement were noted. Additional trocars were then inserted in the following locations: an 8-mm trocar in the left lateral abdomen, and another two 8-mm trocars to the right side of the abdomen 5 cm appart. The umbilical trocar was changed to a 12 mm trocar  all under direct visualization. The abdomen was inspected and no abnormalities were found. The table was placed in the reverse Trendelenburg position with the right side up. The robotic arms were docked and target anatomy identified. Instrument inserted under direct visualization.  Filmy adhesions between the gallbladder and omentum, duodenum and transverse colon were lysed with electrocautery. The dome of the gallbladder was grasped with a prograsp and retracted over the dome of the liver. The infundibulum was also grasped with an atraumatic grasper and retracted toward the right lower quadrant. This maneuver exposed Calot's triangle. The peritoneum overlying the gallbladder infundibulum was then incised and the cystic duct and cystic artery identified and circumferentially dissected. Critical view of safety reviewed before ligating any structure. Firefly images taken to visualize biliary ducts. The cystic duct was ligated very close to the gallbladder neck.  The cystic duct was divided very close to the gallbladder neck.  A small stone was removed from the cystic duct.  Cholangiogram catheter was inserted through the cystic duct.  Cholangiogram was obtained.  No filling defect was identified on the cholangiogram.  The cystic duct was doubly clipped. The gallbladder was then dissected from its peritoneal attachments by electrocautery. Hemostasis was checked and the gallbladder and contained stones were removed using an endoscopic retrieval bag. The gallbladder was passed off the table as a specimen. The gallbladder fossa was copiously irrigated with saline and hemostasis was obtained. There was no evidence of bleeding from the gallbladder fossa or cystic artery or leakage of the bile from the cystic duct stump. Secondary trocars were removed under direct vision. No bleeding was noted. The robotic arms were undoked. The scope was withdrawn and the umbilical trocar removed. The abdomen was allowed to collapse. The  fascia of the 12mm trocar sites was closed with figure-of-eight 0  vicryl sutures. The skin was closed with subcuticular sutures of 4-0 monocryl and topical skin adhesive. The orogastric tube was removed.  The patient tolerated the procedure well and was taken to the postanesthesia care unit in stable condition.   Specimen: Gallbladder  Complications: None  EBL: 10 mL

## 2022-08-21 LAB — SURGICAL PATHOLOGY

## 2022-12-05 ENCOUNTER — Other Ambulatory Visit: Payer: Self-pay | Admitting: Internal Medicine

## 2022-12-11 ENCOUNTER — Other Ambulatory Visit: Payer: Managed Care, Other (non HMO)

## 2022-12-15 ENCOUNTER — Encounter: Payer: Self-pay | Admitting: Internal Medicine

## 2022-12-15 ENCOUNTER — Ambulatory Visit: Payer: Managed Care, Other (non HMO) | Admitting: Internal Medicine

## 2022-12-15 VITALS — BP 120/70 | HR 72 | Temp 97.9°F | Resp 16 | Ht 67.0 in | Wt 236.0 lb

## 2022-12-15 DIAGNOSIS — Z8673 Personal history of transient ischemic attack (TIA), and cerebral infarction without residual deficits: Secondary | ICD-10-CM

## 2022-12-15 DIAGNOSIS — R1011 Right upper quadrant pain: Secondary | ICD-10-CM

## 2022-12-15 DIAGNOSIS — D649 Anemia, unspecified: Secondary | ICD-10-CM

## 2022-12-15 DIAGNOSIS — F419 Anxiety disorder, unspecified: Secondary | ICD-10-CM

## 2022-12-15 DIAGNOSIS — R928 Other abnormal and inconclusive findings on diagnostic imaging of breast: Secondary | ICD-10-CM | POA: Diagnosis not present

## 2022-12-15 DIAGNOSIS — D6851 Activated protein C resistance: Secondary | ICD-10-CM

## 2022-12-15 DIAGNOSIS — K219 Gastro-esophageal reflux disease without esophagitis: Secondary | ICD-10-CM

## 2022-12-15 DIAGNOSIS — Z8669 Personal history of other diseases of the nervous system and sense organs: Secondary | ICD-10-CM

## 2022-12-15 LAB — CBC WITH DIFFERENTIAL/PLATELET
Basophils Absolute: 0.1 10*3/uL (ref 0.0–0.1)
Basophils Relative: 1.2 % (ref 0.0–3.0)
Eosinophils Absolute: 0.2 10*3/uL (ref 0.0–0.7)
Eosinophils Relative: 3.2 % (ref 0.0–5.0)
HCT: 38.4 % (ref 36.0–46.0)
Hemoglobin: 12.5 g/dL (ref 12.0–15.0)
Lymphocytes Relative: 33.2 % (ref 12.0–46.0)
Lymphs Abs: 1.9 10*3/uL (ref 0.7–4.0)
MCHC: 32.4 g/dL (ref 30.0–36.0)
MCV: 87.7 fl (ref 78.0–100.0)
Monocytes Absolute: 0.7 10*3/uL (ref 0.1–1.0)
Monocytes Relative: 11.3 % (ref 3.0–12.0)
Neutro Abs: 3 10*3/uL (ref 1.4–7.7)
Neutrophils Relative %: 51.1 % (ref 43.0–77.0)
Platelets: 297 10*3/uL (ref 150.0–400.0)
RBC: 4.38 Mil/uL (ref 3.87–5.11)
RDW: 13.9 % (ref 11.5–15.5)
WBC: 5.8 10*3/uL (ref 4.0–10.5)

## 2022-12-15 LAB — FERRITIN: Ferritin: 19.7 ng/mL (ref 10.0–291.0)

## 2022-12-15 LAB — HEPATIC FUNCTION PANEL
ALT: 17 U/L (ref 0–35)
AST: 16 U/L (ref 0–37)
Albumin: 3.8 g/dL (ref 3.5–5.2)
Alkaline Phosphatase: 78 U/L (ref 39–117)
Bilirubin, Direct: 0.1 mg/dL (ref 0.0–0.3)
Total Bilirubin: 0.3 mg/dL (ref 0.2–1.2)
Total Protein: 6.8 g/dL (ref 6.0–8.3)

## 2022-12-15 LAB — BASIC METABOLIC PANEL
BUN: 17 mg/dL (ref 6–23)
CO2: 27 mEq/L (ref 19–32)
Calcium: 9.4 mg/dL (ref 8.4–10.5)
Chloride: 104 mEq/L (ref 96–112)
Creatinine, Ser: 0.9 mg/dL (ref 0.40–1.20)
GFR: 74.13 mL/min (ref >60.00)
Glucose, Bld: 100 mg/dL — ABNORMAL HIGH (ref 70–99)
Potassium: 4.6 mEq/L (ref 3.5–5.1)
Sodium: 137 mEq/L (ref 135–145)

## 2022-12-15 LAB — LIPID PANEL
Cholesterol: 138 mg/dL (ref 0–200)
HDL: 40.5 mg/dL (ref >39.00)
LDL Cholesterol: 61 mg/dL (ref 0–99)
NonHDL: 97.56
Total CHOL/HDL Ratio: 3
Triglycerides: 182 mg/dL — ABNORMAL HIGH (ref 0.0–149.0)
VLDL: 36.4 mg/dL (ref 0.0–40.0)

## 2022-12-15 LAB — LIPASE: Lipase: 21 U/L (ref 11.0–59.0)

## 2022-12-15 LAB — TSH: TSH: 1.54 u[IU]/mL (ref 0.35–5.50)

## 2022-12-15 MED ORDER — OMEPRAZOLE 20 MG PO CPDR: 20.0000 mg | DELAYED_RELEASE_CAPSULE | Freq: Every day | ORAL | 3 refills | Status: DC

## 2022-12-15 MED ORDER — ESCITALOPRAM OXALATE 5 MG PO TABS: 5.0000 mg | ORAL_TABLET | Freq: Every day | ORAL | 1 refills | Status: DC

## 2022-12-15 NOTE — Assessment & Plan Note (Signed)
Evaluated by hematology.  Hetrozygous.   

## 2022-12-15 NOTE — Assessment & Plan Note (Signed)
Negative EEG.  Off keppra.  Doing well.  Followed by neurology.  

## 2022-12-15 NOTE — Assessment & Plan Note (Signed)
Was evaluated 08/2022 - acute RUQ pain.  Found to have elevated LFTs with elevated bilirubin (total and direct).  MRCP - No evidence of biliary ductal dilatation but was evidence of subtle mild peribiliary enhancement which is nonspecific and may reflect sequela of recently passed stone. There was evidence of cholelithiasis without findings of acute cholecystitis. Saw Dr Maia Plan - s/p robotic assisted laparoscopic cholecystectomy with intraoperative cholangiogram. Cholangiogram negative for choledocholithiasis. She felt good for a couple of weeks.  She then developed a GI bug. Vomiting and diarrhea for 24-48 hours.  Took imodium. Since then, symptoms have returned.  Same symptoms that she was having prior to gallbladder being removed.  Cholangiogram negative for choledocholithiasis.  Recheck cbc and liver panel today.  Continue prilosec.  Discussed the need to keep bowels moving - benefiber daily.  (Was off for a couple of weeks after surgery).  Further w/up pending results.

## 2022-12-15 NOTE — Progress Notes (Signed)
Subjective:    Patient ID: Gwendolyn Torres, female    DOB: Sep 16, 1970, 52 y.o.   MRN: [id number]  Patient here for  Chief Complaint  Patient presents with   Medical Management of Chronic Issues    HPI Here for follow up appt. History of CVA. On aspirin. Calcium score - 0. Saw cardiology 11/12/21. Review of all Linq tracings have not shown any significant arrhythmias. TEE unremarkable. No reoccurring problems. Saw Dr Lemar Livings 02/04/22 - f/u abnormal mammogram. Elected to hold on biopsy. Recommended mammogram in one year - f/u Dr Maia Plan. On lexapro.  Was evaluated 08/2022 - acute RUQ pain.  Found to have elevated LFTs with elevated bilirubin (total and direct).  MRCP - No evidence of biliary ductal dilatation but was evidence of subtle mild peribiliary enhancement which is nonspecific and may reflect sequela of recently passed stone. There was evidence of cholelithiasis without findings of acute cholecystitis. Saw Dr Maia Plan - s/p robotic assisted laparoscopic cholecystectomy with intraoperative cholangiogram. Cholangiogram negative for choledocholithiasis. She felt good for a couple of weeks.  She then developed a GI bug. Vomiting and diarrhea for 24-48 hours.  Took imodium.  After this, symptoms returned to symptoms she was having prior to having gallbladder removed.  She is having pain - upper back, right upper quadrant and abdominal bloating.  Having issues with her bowels.  Increased bloating.  Bowels moved this weekend - loose stool.  No blood.  Some nausea.  Eating.  TUMS helps to alleviate the symptoms.  No chest pain or sob with increased activity or exertion.  Pain exactly the same as she was experiencing prior to having her gallbladder removed (just not as intense).     Past Medical History:  Diagnosis Date   GERD (gastroesophageal reflux disease)    Past Surgical History:  Procedure Laterality Date   BUNIONECTOMY Right 09/25/2016   Procedure: lapidus type  modified mcbride;  Surgeon:  Recardo Evangelist, DPM;  Location: [facility];  Service: Podiatry;  Laterality: Right;   CESAREAN SECTION  2004   LOOP RECORDER INSERTION N/A 02/19/2017   Procedure: LOOP RECORDER INSERTION;  Surgeon: Marcina Millard, MD;  Location: [facility];  Service: Cardiovascular;  Laterality: N/A;   OSTECTOMY Right 09/25/2016   Procedure: OSTECTOMY RT  1SR,2ND 3RD  metatarsal;  Surgeon: Recardo Evangelist, DPM;  Location: [facility];  Service: Podiatry;  Laterality: Right;  LMA LOCAL   TEE WITHOUT CARDIOVERSION N/A 10/28/2016   Procedure: TRANSESOPHAGEAL ECHOCARDIOGRAM (TEE);  Surgeon: Dalia Heading, MD;  Location: [facility];  Service: Cardiovascular;  Laterality: N/A;   Family History  Problem Relation Age of Onset   Aortic aneurysm Father        also has popliteal aneurysm   Hypertension Father    Hypercholesterolemia Father    Diabetes Father    Aortic aneurysm Mother    Hypercholesterolemia Mother    Hypercholesterolemia Sister        x2   Hyperlipidemia Brother    Hypertension Sister    Breast cancer Maternal Aunt    Melanoma Maternal Uncle    Colon cancer Neg Hx    Social History   Socioeconomic History   Marital status: Married    Spouse name: Not on file   Number of children: 1   Years of education: Not on file   Highest education level: Not on file  Occupational History   Occupation: transcriptionist  Tobacco Use   Smoking status: Never   Smokeless  tobacco: Never  Vaping Use   Vaping status: Never Used  Substance and Sexual Activity   Alcohol use: No    Alcohol/week: 0.0 standard drinks of alcohol   Drug use: No   Sexual activity: Yes    Birth control/protection: None  Other Topics Concern   Not on file  Social History Narrative   Not on file   Social Determinants of Health   Financial Resource Strain: Low Risk  (01/15/2021)   Overall Financial Resource Strain (CARDIA)    Difficulty of Paying Living Expenses: Not hard at all  Food  Insecurity: Not on file  Transportation Needs: Not on file  Physical Activity: Not on file  Stress: Not on file  Social Connections: Not on file     Review of Systems  Constitutional:  Negative for appetite change and unexpected weight change.  HENT:  Negative for congestion and sinus pressure.   Respiratory:  Negative for cough, chest tightness and shortness of breath.   Cardiovascular:  Negative for palpitations and leg swelling.  Gastrointestinal:  Positive for nausea. Negative for vomiting.       Abdominal bloating.  Discomfort as outlined.  Loose stool.   Genitourinary:  Negative for difficulty urinating and dysuria.  Musculoskeletal:  Negative for joint swelling and myalgias.  Skin:  Negative for color change and rash.  Neurological:  Negative for dizziness and headaches.  Psychiatric/Behavioral:  Negative for agitation and dysphoric mood.        Objective:     BP 120/70   Pulse 72   Temp 97.9 F (36.6 C)   Resp 16   Ht 5\' 7"  (1.702 m)   Wt 236 lb (107 kg)   SpO2 98%   BMI 36.96 kg/m  Wt Readings from Last 3 Encounters:  12/15/22 236 lb (107 kg)  08/19/22 232 lb (105.2 kg)  08/15/22 232 lb (105.2 kg)    Physical Exam Vitals reviewed.  Constitutional:      General: She is not in acute distress.    Appearance: Normal appearance.  HENT:     Head: Normocephalic and atraumatic.     Right Ear: External ear normal.     Left Ear: External ear normal.  Eyes:     General: No scleral icterus.       Right eye: No discharge.        Left eye: No discharge.     Conjunctiva/sclera: Conjunctivae normal.  Neck:     Thyroid: No thyromegaly.  Cardiovascular:     Rate and Rhythm: Normal rate and regular rhythm.  Pulmonary:     Effort: No respiratory distress.     Breath sounds: Normal breath sounds. No wheezing.  Abdominal:     General: Bowel sounds are normal.     Palpations: Abdomen is soft.     Tenderness: There is no abdominal tenderness.  Musculoskeletal:         General: No swelling or tenderness.     Cervical back: Neck supple. No tenderness.  Lymphadenopathy:     Cervical: No cervical adenopathy.  Skin:    Findings: No erythema or rash.  Neurological:     Mental Status: She is alert.  Psychiatric:        Mood and Affect: Mood normal.        Behavior: Behavior normal.      Outpatient Encounter Medications as of 12/15/2022  Medication Sig   aspirin 81 MG EC tablet TAKE 1 TABLET BY MOUTH DAILY   cholecalciferol (  VITAMIN D3) 25 MCG (1000 UNIT) tablet Take 1,000 Units by mouth daily.   EPINEPHrine (EPIPEN 2-PAK) 0.3 mg/0.3 mL IJ SOAJ injection Inject 0.3 mg into the muscle as needed for anaphylaxis.   escitalopram (LEXAPRO) 5 MG tablet Take 1 tablet (5 mg total) by mouth daily.   magnesium oxide (MAG-OX) 400 MG tablet Take 400 mg by mouth daily.   omeprazole (PRILOSEC) 20 MG capsule Take 1 capsule (20 mg total) by mouth daily.   tirzepatide (ZEPBOUND) 5 MG/0.5ML Pen INJECT 5 MG SUBCUTANEOUSLY WEEKLY   vitamin B-12 (CYANOCOBALAMIN) 1000 MCG tablet Take 1,000 mcg by mouth daily.   [DISCONTINUED] escitalopram (LEXAPRO) 5 MG tablet TAKE 1 TABLET (5 MG TOTAL) BY MOUTH DAILY.   [DISCONTINUED] omeprazole (PRILOSEC) 20 MG capsule Take 1 capsule (20 mg total) by mouth daily.   [DISCONTINUED] oxyCODONE-acetaminophen (PERCOCET) 5-325 MG tablet Take 1 tablet by mouth every 4 (four) hours as needed for severe pain.   No facility-administered encounter medications on file as of 12/15/2022.     Lab Results  Component Value Date   WBC 5.9 08/12/2022   HGB 12.6 08/12/2022   HCT 37.2 08/12/2022   PLT 272.0 08/12/2022   GLUCOSE 97 08/12/2022   CHOL 143 08/12/2022   TRIG 71.0 08/12/2022   HDL 51.60 08/12/2022   LDLCALC 77 08/12/2022   ALT 16 08/12/2022   AST 18 08/12/2022   NA 140 08/12/2022   K 4.5 08/12/2022   CL 104 08/12/2022   CREATININE 0.89 08/12/2022   BUN 19 08/12/2022   CO2 27 08/12/2022   TSH 1.76 10/08/2021   INR 1.04 10/16/2016    HGBA1C 5.3 04/10/2021    DG Cholangiogram Operative  Result Date: 08/19/2022 CLINICAL DATA:  Intraoperative cholangiogram during laparoscopic cholecystectomy. EXAM: INTRAOPERATIVE CHOLANGIOGRAM FLUOROSCOPY TIME:  31 seconds COMPARISON:  MRCP-08/18/2022 FINDINGS: Intraoperative cholangiographic images of the right upper abdominal quadrant during laparoscopic cholecystectomy are provided for review. Contrast injection demonstrates selective cannulation of the central aspect of the cystic duct. There is passage of contrast through the central aspect of the cystic duct with filling of a mildly dilated common bile duct. There is passage of contrast though the CBD and into the descending portion of the duodenum. There is minimal reflux of injected contrast into the common hepatic duct and central aspect of the non dilated intrahepatic biliary system. There are no discrete filling defects within the opacified portions of the biliary system to suggest the presence of choledocholithiasis. IMPRESSION: No evidence of choledocholithiasis. Electronically Signed   By: Simonne Come M.D.   On: 08/19/2022 15:50       Assessment & Plan:  RUQ pain Assessment & Plan: Was evaluated 08/2022 - acute RUQ pain.  Found to have elevated LFTs with elevated bilirubin (total and direct).  MRCP - No evidence of biliary ductal dilatation but was evidence of subtle mild peribiliary enhancement which is nonspecific and may reflect sequela of recently passed stone. There was evidence of cholelithiasis without findings of acute cholecystitis. Saw Dr Maia Plan - s/p robotic assisted laparoscopic cholecystectomy with intraoperative cholangiogram. Cholangiogram negative for choledocholithiasis. She felt good for a couple of weeks.  She then developed a GI bug. Vomiting and diarrhea for 24-48 hours.  Took imodium. Since then, symptoms have returned.  Same symptoms that she was having prior to gallbladder being removed.  Cholangiogram negative  for choledocholithiasis.  Recheck cbc and liver panel today.  Continue prilosec.  Discussed the need to keep bowels moving - benefiber daily.  (Was  off for a couple of weeks after surgery).  Further w/up pending results.    Orders: -     Lipase  Anemia, unspecified type Assessment & Plan: Recheck cbc and ferritin today.   Orders: -     Ferritin -     CBC with Differential/Platelet  History of CVA (cerebrovascular accident) Assessment & Plan: No acute abnormality noted on MRI. Continue daily aspirin.  Calcium score 0.  Desires not to take crestor.   Orders: -     TSH -     Basic metabolic panel -     Hepatic function panel -     Lipid panel  Abnormal mammogram Assessment & Plan: Dr Lemar Livings 02/05/22 - dianostic mammogram in one year.  F/u wih Dr Hazle Quant.     Anxiety Assessment & Plan: Continue lexapro.  Appears to be doing well.    Factor V Leiden mutation [facility]) Assessment & Plan: Evaluated by hematology.  Hetrozygous.     Gastroesophageal reflux disease without esophagitis Assessment & Plan: On prilosec. Symptoms as outlined.  TUMS does help to ease off some.     History of seizure disorder Assessment & Plan: Negative EEG.  Off keppra.  Doing well.  Followed by neurology.    Other orders -     Escitalopram Oxalate; Take 1 tablet (5 mg total) by mouth daily.  Dispense: 90 tablet; Refill: 1 -     Omeprazole; Take 1 capsule (20 mg total) by mouth daily.  Dispense: 90 capsule; Refill: 3     Dale Limestone, MD

## 2022-12-15 NOTE — Assessment & Plan Note (Signed)
Recheck cbc and ferritin today.

## 2022-12-15 NOTE — Assessment & Plan Note (Signed)
No acute abnormality noted on MRI. Continue daily aspirin.  Calcium score 0.  Desires not to take crestor.  

## 2022-12-15 NOTE — Assessment & Plan Note (Signed)
On prilosec. Symptoms as outlined.  TUMS does help to ease off some.

## 2022-12-15 NOTE — Assessment & Plan Note (Signed)
Continue lexapro.  Appears to be doing well.  

## 2022-12-15 NOTE — Assessment & Plan Note (Signed)
Dr Byrnett 02/05/22 - dianostic mammogram in one year.  F/u wih Dr Cintron-Diaz.   

## 2022-12-31 ENCOUNTER — Other Ambulatory Visit: Payer: Self-pay | Admitting: General Surgery

## 2022-12-31 DIAGNOSIS — R921 Mammographic calcification found on diagnostic imaging of breast: Secondary | ICD-10-CM

## 2022-12-31 DIAGNOSIS — Z853 Personal history of malignant neoplasm of breast: Secondary | ICD-10-CM

## 2023-01-21 ENCOUNTER — Other Ambulatory Visit: Payer: Self-pay | Admitting: Student

## 2023-01-21 DIAGNOSIS — G43109 Migraine with aura, not intractable, without status migrainosus: Secondary | ICD-10-CM

## 2023-02-05 ENCOUNTER — Encounter: Payer: Self-pay | Admitting: Student

## 2023-02-09 ENCOUNTER — Ambulatory Visit
Admission: RE | Admit: 2023-02-09 | Discharge: 2023-02-09 | Disposition: A | Discharge status: Home / Self Care | Payer: Managed Care, Other (non HMO) | Source: Ambulatory Visit | Attending: Student | Admitting: Student

## 2023-02-09 DIAGNOSIS — G43109 Migraine with aura, not intractable, without status migrainosus: Secondary | ICD-10-CM

## 2023-02-09 MED ORDER — GADOPICLENOL 0.5 MMOL/ML IV SOLN
10.0000 mL | Freq: Once | INTRAVENOUS | Status: AC | PRN
  Administered 2023-02-09: 10 mL via INTRAVENOUS

## 2023-02-10 ENCOUNTER — Ambulatory Visit
Admission: RE | Admit: 2023-02-10 | Discharge: 2023-02-10 | Disposition: A | Discharge status: Home / Self Care | Payer: Managed Care, Other (non HMO) | Source: Ambulatory Visit | Attending: General Surgery | Admitting: General Surgery

## 2023-02-10 ENCOUNTER — Other Ambulatory Visit: Payer: Self-pay | Admitting: General Surgery

## 2023-02-10 DIAGNOSIS — R921 Mammographic calcification found on diagnostic imaging of breast: Secondary | ICD-10-CM | POA: Insufficient documentation

## 2023-02-10 DIAGNOSIS — R928 Other abnormal and inconclusive findings on diagnostic imaging of breast: Secondary | ICD-10-CM

## 2023-02-11 ENCOUNTER — Other Ambulatory Visit (HOSPITAL_COMMUNITY): Payer: Self-pay

## 2023-02-18 ENCOUNTER — Ambulatory Visit
Admission: RE | Admit: 2023-02-18 | Discharge: 2023-02-18 | Disposition: A | Discharge status: Home / Self Care | Payer: Managed Care, Other (non HMO) | Source: Ambulatory Visit | Attending: General Surgery | Admitting: General Surgery

## 2023-02-18 DIAGNOSIS — R921 Mammographic calcification found on diagnostic imaging of breast: Secondary | ICD-10-CM | POA: Insufficient documentation

## 2023-02-18 DIAGNOSIS — R928 Other abnormal and inconclusive findings on diagnostic imaging of breast: Secondary | ICD-10-CM | POA: Insufficient documentation

## 2023-02-18 DIAGNOSIS — N6011 Diffuse cystic mastopathy of right breast: Secondary | ICD-10-CM | POA: Diagnosis present

## 2023-02-18 HISTORY — PX: BREAST BIOPSY: SHX20

## 2023-02-18 MED ORDER — LIDOCAINE 1 % OPTIME INJ - NO CHARGE
5.0000 mL | Freq: Once | INTRAMUSCULAR | Status: AC
  Administered 2023-02-18: 5 mL
  Filled 2023-02-18: qty 6

## 2023-02-18 MED ORDER — LIDOCAINE-EPINEPHRINE 1 %-1:100000 IJ SOLN
20.0000 mL | Freq: Once | INTRAMUSCULAR | Status: AC
  Administered 2023-02-18: 20 mL
  Filled 2023-02-18: qty 20

## 2023-02-19 LAB — SURGICAL PATHOLOGY

## 2023-04-09 ENCOUNTER — Telehealth: Payer: Self-pay | Admitting: Internal Medicine

## 2023-04-09 DIAGNOSIS — Z13220 Encounter for screening for lipoid disorders: Secondary | ICD-10-CM

## 2023-04-09 DIAGNOSIS — D649 Anemia, unspecified: Secondary | ICD-10-CM

## 2023-04-09 NOTE — Telephone Encounter (Signed)
Patient need lab orders.

## 2023-04-13 ENCOUNTER — Other Ambulatory Visit (INDEPENDENT_AMBULATORY_CARE_PROVIDER_SITE_OTHER): Payer: Managed Care, Other (non HMO)

## 2023-04-13 DIAGNOSIS — D649 Anemia, unspecified: Secondary | ICD-10-CM

## 2023-04-13 DIAGNOSIS — Z13220 Encounter for screening for lipoid disorders: Secondary | ICD-10-CM | POA: Diagnosis not present

## 2023-04-13 LAB — BASIC METABOLIC PANEL
BUN: 19 mg/dL (ref 6–23)
CO2: 27 meq/L (ref 19–32)
Calcium: 8.9 mg/dL (ref 8.4–10.5)
Chloride: 106 meq/L (ref 96–112)
Creatinine, Ser: 0.79 mg/dL (ref 0.40–1.20)
GFR: 86.48 mL/min (ref >60.00)
Glucose, Bld: 105 mg/dL — ABNORMAL HIGH (ref 70–99)
Potassium: 4.4 meq/L (ref 3.5–5.1)
Sodium: 140 meq/L (ref 135–145)

## 2023-04-13 LAB — HEPATIC FUNCTION PANEL
ALT: 15 U/L (ref 0–35)
AST: 16 U/L (ref 0–37)
Albumin: 3.9 g/dL (ref 3.5–5.2)
Alkaline Phosphatase: 79 U/L (ref 39–117)
Bilirubin, Direct: 0.1 mg/dL (ref 0.0–0.3)
Total Bilirubin: 0.3 mg/dL (ref 0.2–1.2)
Total Protein: 6.8 g/dL (ref 6.0–8.3)

## 2023-04-13 LAB — LIPID PANEL
Cholesterol: 149 mg/dL (ref 0–200)
HDL: 48.8 mg/dL (ref >39.00)
LDL Cholesterol: 85 mg/dL (ref 0–99)
NonHDL: 99.74
Total CHOL/HDL Ratio: 3
Triglycerides: 74 mg/dL (ref 0.0–149.0)
VLDL: 14.8 mg/dL (ref 0.0–40.0)

## 2023-04-16 ENCOUNTER — Ambulatory Visit (INDEPENDENT_AMBULATORY_CARE_PROVIDER_SITE_OTHER): Payer: Managed Care, Other (non HMO) | Admitting: Internal Medicine

## 2023-04-16 ENCOUNTER — Encounter: Payer: Self-pay | Admitting: Internal Medicine

## 2023-04-16 VITALS — BP 110/80 | HR 71 | Temp 98.2°F | Resp 17 | Ht 66.75 in | Wt 239.0 lb

## 2023-04-16 DIAGNOSIS — D649 Anemia, unspecified: Secondary | ICD-10-CM | POA: Diagnosis not present

## 2023-04-16 DIAGNOSIS — Z8673 Personal history of transient ischemic attack (TIA), and cerebral infarction without residual deficits: Secondary | ICD-10-CM

## 2023-04-16 DIAGNOSIS — Z0000 Encounter for general adult medical examination without abnormal findings: Secondary | ICD-10-CM | POA: Diagnosis not present

## 2023-04-16 DIAGNOSIS — N926 Irregular menstruation, unspecified: Secondary | ICD-10-CM

## 2023-04-16 DIAGNOSIS — K219 Gastro-esophageal reflux disease without esophagitis: Secondary | ICD-10-CM

## 2023-04-16 DIAGNOSIS — E559 Vitamin D deficiency, unspecified: Secondary | ICD-10-CM | POA: Diagnosis not present

## 2023-04-16 DIAGNOSIS — G43109 Migraine with aura, not intractable, without status migrainosus: Secondary | ICD-10-CM

## 2023-04-16 DIAGNOSIS — Z13220 Encounter for screening for lipoid disorders: Secondary | ICD-10-CM | POA: Diagnosis not present

## 2023-04-16 DIAGNOSIS — D6851 Activated protein C resistance: Secondary | ICD-10-CM

## 2023-04-16 DIAGNOSIS — Z8669 Personal history of other diseases of the nervous system and sense organs: Secondary | ICD-10-CM

## 2023-04-16 MED ORDER — ESCITALOPRAM OXALATE 5 MG PO TABS: 5.0000 mg | ORAL_TABLET | Freq: Every day | ORAL | 1 refills | Status: DC

## 2023-04-16 NOTE — Assessment & Plan Note (Signed)
Physical today 04/16/23.  PAP 04/09/21 - negative with negative HPV.   Last diagnostic mammogram on October 2024 shows increased area of calcification up to 1.2 cm. Core biopsy without atypia or malignancy. Patient can return to bilateral screening mammography. Colonoscopy 05/17/20 - recommended f/u in 10 years.

## 2023-04-16 NOTE — Progress Notes (Signed)
Subjective:    Patient ID: Gwendolyn Torres, female    DOB: February 19, 1971, 52 y.o.   MRN: [id number]  Patient here for a physical exam.   HPI Here for a physical exam. Had f/u with Dr Maia Plan 02/24/23 - Last diagnostic mammogram on October 2024 shows increased area of calcification up to 1.2 cm. Core biopsy without atypia or malignancy. Patient can return to bilateral screening mammography. Had f/u with neurology 01/20/23. Evaluated for recurrent vision changes with paresthesia concerning for migraine aura - in a perimenopausal patient - episode of dizziness + tinnitus on 03/07/2020 - increased aura frequency. Recommended MRI. MRI performed 02/2023 - stable. Unchanged 10mm meningioma. History of CVA. On aspirin. Calcium score - 0. Saw cardiology 11/12/21. Review of all Linq tracings have not shown any significant arrhythmias. TEE unremarkable. Was evaluated 08/2022 - acute RUQ pain.  Found to have elevated LFTs with elevated bilirubin (total and direct).  MRCP - No evidence of biliary ductal dilatation but was evidence of subtle mild peribiliary enhancement which is nonspecific and may reflect sequela of recently passed stone. There was evidence of cholelithiasis without findings of acute cholecystitis. Saw Dr Maia Plan - s/p robotic assisted laparoscopic cholecystectomy with intraoperative cholangiogram. Cholangiogram negative for choledocholithiasis. No abdominal pain.  Bowels moving better. No diarrhea. Omeprazole controlling acid reflux. No chest pain or sob with increased activity or exertion. Vision change/headaches better. Handling stress.  Overall she feels she is doing well.    Past Medical History:  Diagnosis Date   GERD (gastroesophageal reflux disease)    Past Surgical History:  Procedure Laterality Date   BREAST BIOPSY Right 02/18/2023   Right Breast Stereo Bx, Coil clip, path pending   BREAST BIOPSY Right 02/18/2023   MM RT BREAST BX W LOC DEV 1ST LESION IMAGE BX SPEC STEREO GUIDE 02/18/2023  [facility]   BUNIONECTOMY Right 09/25/2016   Procedure: lapidus type  modified mcbride;  Surgeon: Recardo Evangelist, DPM;  Location: [facility];  Service: Podiatry;  Laterality: Right;   CESAREAN SECTION  2004   LOOP RECORDER INSERTION N/A 02/19/2017   Procedure: LOOP RECORDER INSERTION;  Surgeon: Marcina Millard, MD;  Location: [facility];  Service: Cardiovascular;  Laterality: N/A;   OSTECTOMY Right 09/25/2016   Procedure: OSTECTOMY RT  1SR,2ND 3RD  metatarsal;  Surgeon: Recardo Evangelist, DPM;  Location: [facility];  Service: Podiatry;  Laterality: Right;  LMA LOCAL   TEE WITHOUT CARDIOVERSION N/A 10/28/2016   Procedure: TRANSESOPHAGEAL ECHOCARDIOGRAM (TEE);  Surgeon: Dalia Heading, MD;  Location: [facility];  Service: Cardiovascular;  Laterality: N/A;   Family History  Problem Relation Age of Onset   Aortic aneurysm Father        also has popliteal aneurysm   Hypertension Father    Hypercholesterolemia Father    Diabetes Father    Aortic aneurysm Mother    Hypercholesterolemia Mother    Hypercholesterolemia Sister        x2   Hyperlipidemia Brother    Hypertension Sister    Breast cancer Maternal Aunt    Melanoma Maternal Uncle    Colon cancer Neg Hx    Social History   Socioeconomic History   Marital status: Married    Spouse name: Not on file   Number of children: 1   Years of education: Not on file   Highest education level: Not on file  Occupational History   Occupation: transcriptionist  Tobacco Use   Smoking status: Never   Smokeless tobacco: Never  Vaping Use   Vaping status: Never Used  Substance and Sexual Activity   Alcohol use: No    Alcohol/week: 0.0 standard drinks of alcohol   Drug use: No   Sexual activity: Yes    Birth control/protection: None  Other Topics Concern   Not on file  Social History Narrative   Not on file   Social Drivers of Health   Financial Resource Strain: Low Risk  (01/15/2021)   Overall  Financial Resource Strain (CARDIA)    Difficulty of Paying Living Expenses: Not hard at all  Food Insecurity: Not on file  Transportation Needs: Not on file  Physical Activity: Not on file  Stress: Not on file  Social Connections: Not on file     Review of Systems  Constitutional:  Negative for appetite change and unexpected weight change.  HENT:  Negative for congestion, sinus pressure and sore throat.   Eyes:  Negative for pain and visual disturbance.  Respiratory:  Negative for cough, chest tightness and shortness of breath.   Cardiovascular:  Negative for chest pain, palpitations and leg swelling.  Gastrointestinal:  Negative for abdominal pain, diarrhea, nausea and vomiting.  Genitourinary:  Negative for difficulty urinating and dysuria.  Musculoskeletal:  Negative for joint swelling and myalgias.  Skin:  Negative for color change and rash.  Neurological:  Negative for dizziness and headaches.  Hematological:  Negative for adenopathy. Does not bruise/bleed easily.  Psychiatric/Behavioral:  Negative for agitation and dysphoric mood.        Objective:     BP 110/80 (Cuff Size: Large)   Pulse 71   Temp 98.2 F (36.8 C) (Oral)   Resp 17   Ht 5' 6.75" (1.695 m)   Wt 239 lb (108.4 kg)   LMP 04/05/2023 (Approximate)   SpO2 98%   BMI 37.71 kg/m  Wt Readings from Last 3 Encounters:  04/16/23 239 lb (108.4 kg)  12/15/22 236 lb (107 kg)  08/19/22 232 lb (105.2 kg)    Physical Exam Vitals reviewed.  Constitutional:      General: She is not in acute distress.    Appearance: Normal appearance. She is well-developed.  HENT:     Head: Normocephalic and atraumatic.     Right Ear: External ear normal.     Left Ear: External ear normal.     Mouth/Throat:     Pharynx: No oropharyngeal exudate or posterior oropharyngeal erythema.  Eyes:     General: No scleral icterus.       Right eye: No discharge.        Left eye: No discharge.     Conjunctiva/sclera: Conjunctivae  normal.  Neck:     Thyroid: No thyromegaly.  Cardiovascular:     Rate and Rhythm: Normal rate and regular rhythm.  Pulmonary:     Effort: No tachypnea, accessory muscle usage or respiratory distress.     Breath sounds: Normal breath sounds. No decreased breath sounds or wheezing.  Chest:  Breasts:    Right: No inverted nipple, mass, nipple discharge or tenderness (no axillary adenopathy).     Left: No inverted nipple, mass, nipple discharge or tenderness (no axilarry adenopathy).  Abdominal:     General: Bowel sounds are normal.     Palpations: Abdomen is soft.     Tenderness: There is no abdominal tenderness.  Musculoskeletal:        General: No swelling or tenderness.     Cervical back: Neck supple.  Lymphadenopathy:     Cervical: No  cervical adenopathy.  Skin:    Findings: No erythema or rash.  Neurological:     Mental Status: She is alert and oriented to person, place, and time.  Psychiatric:        Mood and Affect: Mood normal.        Behavior: Behavior normal.      Outpatient Encounter Medications as of 04/16/2023  Medication Sig   aspirin 81 MG EC tablet TAKE 1 TABLET BY MOUTH DAILY   cholecalciferol (VITAMIN D3) 25 MCG (1000 UNIT) tablet Take 1,000 Units by mouth daily.   EPINEPHrine (EPIPEN 2-PAK) 0.3 mg/0.3 mL IJ SOAJ injection Inject 0.3 mg into the muscle as needed for anaphylaxis.   magnesium oxide (MAG-OX) 400 MG tablet Take 400 mg by mouth daily.   omeprazole (PRILOSEC) 20 MG capsule Take 1 capsule (20 mg total) by mouth daily.   vitamin B-12 (CYANOCOBALAMIN) 1000 MCG tablet Take 1,000 mcg by mouth daily.   escitalopram (LEXAPRO) 5 MG tablet Take 1 tablet (5 mg total) by mouth daily.   [DISCONTINUED] escitalopram (LEXAPRO) 5 MG tablet Take 1 tablet (5 mg total) by mouth daily.   [DISCONTINUED] tirzepatide (ZEPBOUND) 5 MG/0.5ML Pen INJECT 5 MG SUBCUTANEOUSLY WEEKLY (Patient not taking: Reported on 04/16/2023)   No facility-administered encounter medications  on file as of 04/16/2023.     Lab Results  Component Value Date   WBC 5.8 12/15/2022   HGB 12.5 12/15/2022   HCT 38.4 12/15/2022   PLT 297.0 12/15/2022   GLUCOSE 105 (H) 04/13/2023   CHOL 149 04/13/2023   TRIG 74.0 04/13/2023   HDL 48.80 04/13/2023   LDLCALC 85 04/13/2023   ALT 15 04/13/2023   AST 16 04/13/2023   NA 140 04/13/2023   K 4.4 04/13/2023   CL 106 04/13/2023   CREATININE 0.79 04/13/2023   BUN 19 04/13/2023   CO2 27 04/13/2023   TSH 1.54 12/15/2022   INR 1.04 10/16/2016   HGBA1C 5.3 04/10/2021    MM RT BREAST BX W LOC DEV 1ST LESION IMAGE BX SPEC STEREO GUIDE Addendum Date: 02/19/2023 ADDENDUM REPORT: 02/19/2023 14:30 ADDENDUM: PATHOLOGY revealed: 1. Breast, right, needle core biopsy, lower outer middle depth, coil clip : - BENIGN BREAST PARENCHYMA WITH FIBROCYSTIC CHANGES INCLUDING STROMAL FIBROSIS, COLUMNAR CELL CHANGE AND ASSOCIATED MICROCALCIFICATIONS - FOCAL ADENOSIS - NEGATIVE FOR MALIGNANCY. Pathology results are CONCORDANT with imaging findings, per Dr. Meda Klinefelter. Pathology results and recommendations were discussed with patient via telephone on 02/19/2023. Patient reported biopsy site doing well with no adverse symptoms, and only slight tenderness at the site. Post biopsy care instructions were reviewed, questions were answered and my direct phone number was provided. Patient was instructed to call [facility] for any additional questions or concerns related to biopsy site. RECOMMENDATION: Patient instructed to resume annual bilateral screening mammogram due October 2025. Pathology results reported by Randa Lynn RN on 02/19/2023. Electronically Signed   By: Meda Klinefelter M.D.   On: 02/19/2023 14:30   Result Date: 02/19/2023 CLINICAL DATA:  Indeterminate RIGHT breast calcifications EXAM: RIGHT BREAST STEREOTACTIC CORE NEEDLE BIOPSY COMPARISON:  Previous exam(s). FINDINGS: The patient and I discussed the procedure of stereotactic-guided biopsy  including benefits and alternatives. We discussed the high likelihood of a successful procedure. We discussed the risks of the procedure including infection, bleeding, tissue injury, clip migration, and inadequate sampling. Informed written consent was given. The usual time out protocol was performed immediately prior to the procedure. Using sterile technique and 1% lidocaine and 1% lidocaine  with epinephrine as local anesthetic, under stereotactic guidance, a 9 gauge vacuum assisted device was used to perform core needle biopsy of calcifications in the lower outer quadrant of the RIGHT breast using a lateral approach. Specimen radiograph was performed showing representative calcifications. Specimens with calcifications are identified for pathology. Lesion quadrant: Lower outer quadrant At the conclusion of the procedure, a COIL shaped tissue marker clip was deployed into the biopsy cavity. Follow-up 2-view mammogram was performed and dictated separately. IMPRESSION: Stereotactic-guided biopsy of indeterminate calcifications. No apparent complications. Electronically Signed: By: Meda Klinefelter M.D. On: 02/18/2023 09:27   MM CLIP PLACEMENT RIGHT Result Date: 02/18/2023 CLINICAL DATA:  Status post stereotactic guided biopsy EXAM: 3D DIAGNOSTIC RIGHT MAMMOGRAM POST STEREOTACTIC BIOPSY COMPARISON:  Previous exam(s). FINDINGS: 3D Mammographic images were obtained following stereotactic guided biopsy of indeterminate calcifications. The COIL biopsy marking clip is in expected position at the site of biopsy. IMPRESSION: Appropriate positioning of the COIL shaped biopsy marking clip at the site of biopsy in the lower outer breast. Final Assessment: Post Procedure Mammograms for Marker Placement Electronically Signed   By: Meda Klinefelter M.D.   On: 02/18/2023 09:27       Assessment & Plan:  Routine general medical examination at a health care facility  Screening cholesterol level -     Lipid panel;  Future  Anemia, unspecified type -     Hepatic function panel; Future -     Basic metabolic panel; Future -     CBC with Differential/Platelet; Future -     TSH; Future  Health care maintenance Assessment & Plan: Physical today 04/16/23.  PAP 04/09/21 - negative with negative HPV.   Last diagnostic mammogram on October 2024 shows increased area of calcification up to 1.2 cm. Core biopsy without atypia or malignancy. Patient can return to bilateral screening mammography. Colonoscopy 05/17/20 - recommended f/u in 10 years.     Vitamin D deficiency Assessment & Plan: Last vitamin D level 21.  Check vitamin D level with next labs. Follow.    Orders: -     VITAMIN D 25 Hydroxy (Vit-D Deficiency, Fractures); Future  Migraine with aura and without status migrainosus, not intractable Assessment & Plan:  Had f/u with neurology 01/20/23. Evaluated for recurrent vision changes with paresthesia concerning for migraine aura - in a perimenopausal patient - episode of dizziness + tinnitus on 03/07/2020 - increased aura frequency. Recommended MRI. MRI performed 02/2023 - stable. Unchanged 10mm meningioma. Overall doing better now.  No "episodes" in one month.  Follow.    Menstrual changes Assessment & Plan: Had menstrual period 04/26/22. No bleeding or spotting until last week. Had some minimal bleeding 2 days.  Will continue to keep menstrual diary..  follow.  Call with update.    History of seizure disorder Assessment & Plan: Negative EEG.  Off keppra.  Doing well.  Followed by neurology.    History of CVA (cerebrovascular accident) Assessment & Plan: No acute abnormality noted on MRI. Continue daily aspirin.  Calcium score 0.  Desires not to take crestor.    Gastroesophageal reflux disease without esophagitis Assessment & Plan: On prilosec. No symptoms    Factor V Leiden mutation [facility]) Assessment & Plan: Evaluated by hematology.  Hetrozygous.     Other orders -     Escitalopram  Oxalate; Take 1 tablet (5 mg total) by mouth daily.  Dispense: 90 tablet; Refill: 1     Dale [place], MD

## 2023-04-16 NOTE — Assessment & Plan Note (Signed)
On prilosec. No symptoms

## 2023-04-16 NOTE — Assessment & Plan Note (Signed)
Had menstrual period 04/26/22. No bleeding or spotting until last week. Had some minimal bleeding 2 days.  Will continue to keep menstrual diary..  follow.  Call with update.

## 2023-04-16 NOTE — Assessment & Plan Note (Signed)
Negative EEG.  Off keppra.  Doing well.  Followed by neurology.  

## 2023-04-16 NOTE — Assessment & Plan Note (Signed)
Had f/u with neurology 01/20/23. Evaluated for recurrent vision changes with paresthesia concerning for migraine aura - in a perimenopausal patient - episode of dizziness + tinnitus on 03/07/2020 - increased aura frequency. Recommended MRI. MRI performed 02/2023 - stable. Unchanged 10mm meningioma. Overall doing better now.  No "episodes" in one month.  Follow.

## 2023-04-16 NOTE — Assessment & Plan Note (Signed)
No acute abnormality noted on MRI. Continue daily aspirin.  Calcium score 0.  Desires not to take crestor.  

## 2023-04-16 NOTE — Assessment & Plan Note (Signed)
Evaluated by hematology.  Hetrozygous.   

## 2023-04-16 NOTE — Assessment & Plan Note (Signed)
Last vitamin D level 21.  Check vitamin D level with next labs. Follow.

## 2023-06-10 ENCOUNTER — Telehealth: Payer: Self-pay | Admitting: Internal Medicine

## 2023-06-10 DIAGNOSIS — N939 Abnormal uterine and vaginal bleeding, unspecified: Secondary | ICD-10-CM

## 2023-06-10 NOTE — Telephone Encounter (Signed)
 Last visit, she had mentioned she had some vaginal spotting after almost one year. I had asked her to keep menstrual diary. I called and left her a message to see if she has had any further bleeding.  Please f/u with her and see if any further spotting or bleeding.  Thanks

## 2023-06-11 NOTE — Telephone Encounter (Signed)
 Since it was basically one year since her previous period and then had this spotting, I would like to get gyn to evaluate to confirm if further w/up warranted.  If agreeable, need to confirm which gyn she prefers to see.

## 2023-06-11 NOTE — Telephone Encounter (Signed)
 Patient has had no further bleeding since the 3 days in December that she reported at her visit.

## 2023-06-12 NOTE — Telephone Encounter (Signed)
 Patient says she would prefer to see Dr Alvia Awkward at [facility]

## 2023-06-13 NOTE — Addendum Note (Signed)
 Addended by: Raejean Bullock on: 06/13/2023 10:44 AM   Modules accepted: Orders

## 2023-06-13 NOTE — Telephone Encounter (Signed)
Order placed for gyn referral.

## 2023-08-04 ENCOUNTER — Other Ambulatory Visit: Payer: Self-pay | Admitting: Family Medicine

## 2023-08-04 DIAGNOSIS — R7989 Other specified abnormal findings of blood chemistry: Secondary | ICD-10-CM

## 2023-10-12 NOTE — Addendum Note (Signed)
 Addended by: Lindle Rhea on: 10/12/2023 04:28 PM   Modules accepted: Orders

## 2023-10-13 ENCOUNTER — Other Ambulatory Visit (INDEPENDENT_AMBULATORY_CARE_PROVIDER_SITE_OTHER): Payer: Managed Care, Other (non HMO)

## 2023-10-13 DIAGNOSIS — Z13220 Encounter for screening for lipoid disorders: Secondary | ICD-10-CM | POA: Diagnosis not present

## 2023-10-13 DIAGNOSIS — E559 Vitamin D deficiency, unspecified: Secondary | ICD-10-CM

## 2023-10-13 DIAGNOSIS — D649 Anemia, unspecified: Secondary | ICD-10-CM

## 2023-10-14 LAB — HEPATIC FUNCTION PANEL
ALT: 32 IU/L (ref 0–32)
AST: 26 IU/L (ref 0–40)
Albumin: 4.2 g/dL (ref 3.8–4.9)
Alkaline Phosphatase: 101 IU/L (ref 44–121)
Bilirubin Total: 0.3 mg/dL (ref 0.0–1.2)
Bilirubin, Direct: 0.16 mg/dL (ref 0.00–0.40)
Total Protein: 7.2 g/dL (ref 6.0–8.5)

## 2023-10-14 LAB — CBC WITH DIFFERENTIAL/PLATELET
Basophils Absolute: 0.1 10*3/uL (ref 0.0–0.2)
Basos: 1 %
EOS (ABSOLUTE): 0.2 10*3/uL (ref 0.0–0.4)
Eos: 3 %
Hematocrit: 41 % (ref 34.0–46.6)
Hemoglobin: 13 g/dL (ref 11.1–15.9)
Immature Grans (Abs): 0.1 10*3/uL (ref 0.0–0.1)
Immature Granulocytes: 1 %
Lymphocytes Absolute: 2 10*3/uL (ref 0.7–3.1)
Lymphs: 28 %
MCH: 28.1 pg (ref 26.6–33.0)
MCHC: 31.7 g/dL (ref 31.5–35.7)
MCV: 89 fL (ref 79–97)
Monocytes Absolute: 0.7 10*3/uL (ref 0.1–0.9)
Monocytes: 11 %
Neutrophils Absolute: 3.9 10*3/uL (ref 1.4–7.0)
Neutrophils: 56 %
Platelets: 298 10*3/uL (ref 150–450)
RBC: 4.62 x10E6/uL (ref 3.77–5.28)
RDW: 14.1 % (ref 11.7–15.4)
WBC: 6.9 10*3/uL (ref 3.4–10.8)

## 2023-10-14 LAB — BASIC METABOLIC PANEL WITH GFR
BUN/Creatinine Ratio: 23 (ref 9–23)
BUN: 21 mg/dL (ref 6–24)
CO2: 21 mmol/L (ref 20–29)
Calcium: 9.4 mg/dL (ref 8.7–10.2)
Chloride: 102 mmol/L (ref 96–106)
Creatinine, Ser: 0.93 mg/dL (ref 0.57–1.00)
Glucose: 86 mg/dL (ref 70–99)
Potassium: 4.6 mmol/L (ref 3.5–5.2)
Sodium: 139 mmol/L (ref 134–144)
eGFR: 74 mL/min/{1.73_m2} (ref >59)

## 2023-10-14 LAB — VITAMIN D 25 HYDROXY (VIT D DEFICIENCY, FRACTURES): Vit D, 25-Hydroxy: 21.5 ng/mL — ABNORMAL LOW (ref 30.0–100.0)

## 2023-10-14 LAB — TSH: TSH: 1.62 u[IU]/mL (ref 0.450–4.500)

## 2023-10-14 LAB — LIPID PANEL
Chol/HDL Ratio: 3.1 ratio (ref 0.0–4.4)
Cholesterol, Total: 160 mg/dL (ref 100–199)
HDL: 52 mg/dL (ref >39)
LDL Chol Calc (NIH): 89 mg/dL (ref 0–99)
Triglycerides: 103 mg/dL (ref 0–149)
VLDL Cholesterol Cal: 19 mg/dL (ref 5–40)

## 2023-10-15 ENCOUNTER — Encounter: Payer: Self-pay | Admitting: Internal Medicine

## 2023-10-15 ENCOUNTER — Ambulatory Visit: Payer: Managed Care, Other (non HMO) | Admitting: Internal Medicine

## 2023-10-15 ENCOUNTER — Ambulatory Visit: Payer: Self-pay | Admitting: Internal Medicine

## 2023-10-15 VITALS — BP 112/72 | HR 75 | Temp 98.0°F | Resp 16 | Ht 67.0 in | Wt 241.0 lb

## 2023-10-15 DIAGNOSIS — Z8673 Personal history of transient ischemic attack (TIA), and cerebral infarction without residual deficits: Secondary | ICD-10-CM

## 2023-10-15 DIAGNOSIS — Z13220 Encounter for screening for lipoid disorders: Secondary | ICD-10-CM

## 2023-10-15 DIAGNOSIS — D6851 Activated protein C resistance: Secondary | ICD-10-CM

## 2023-10-15 DIAGNOSIS — K219 Gastro-esophageal reflux disease without esophagitis: Secondary | ICD-10-CM

## 2023-10-15 DIAGNOSIS — Z1231 Encounter for screening mammogram for malignant neoplasm of breast: Secondary | ICD-10-CM

## 2023-10-15 DIAGNOSIS — F419 Anxiety disorder, unspecified: Secondary | ICD-10-CM | POA: Diagnosis not present

## 2023-10-15 DIAGNOSIS — D649 Anemia, unspecified: Secondary | ICD-10-CM | POA: Diagnosis not present

## 2023-10-15 DIAGNOSIS — E559 Vitamin D deficiency, unspecified: Secondary | ICD-10-CM

## 2023-10-15 MED ORDER — OMEPRAZOLE 20 MG PO CPDR: 20.0000 mg | DELAYED_RELEASE_CAPSULE | Freq: Every day | ORAL | 3 refills | Status: AC

## 2023-10-15 MED ORDER — ESCITALOPRAM OXALATE 5 MG PO TABS: 5.0000 mg | ORAL_TABLET | Freq: Every day | ORAL | 1 refills | Status: DC

## 2023-10-15 MED ORDER — VITAMIN D (ERGOCALCIFEROL) 1.25 MG (50000 UNIT) PO CAPS: 50000.0000 [IU] | ORAL_CAPSULE | ORAL | 0 refills | Status: DC

## 2023-10-15 NOTE — Assessment & Plan Note (Signed)
 If she takes prilosec daily, no symptoms. If skips several days, will notice acid reflux. Continue omeprazole  daily. Avoid eating late. Follow.

## 2023-10-15 NOTE — Assessment & Plan Note (Signed)
 Reported history of. Hgb on recent check wnl.

## 2023-10-15 NOTE — Patient Instructions (Signed)
 After you complete the prescription vitamin D , start vitamin D3 1000 units per day.

## 2023-10-15 NOTE — Assessment & Plan Note (Signed)
Evaluated by hematology.  Hetrozygous.   

## 2023-10-15 NOTE — Assessment & Plan Note (Signed)
No acute abnormality noted on MRI. Continue daily aspirin.  Calcium score 0.  Desires not to take crestor.  

## 2023-10-15 NOTE — Progress Notes (Signed)
 Subjective:    Patient ID: Gwendolyn Torres, female    DOB: 1970-07-20, 54 y.o.   MRN: [id number]  Patient here for  Chief Complaint  Patient presents with   Medical Management of Chronic Issues    HPI Here for a scheduled follow up.  Had f/u with Dr Charmel Cooter 02/24/23 - Last diagnostic mammogram on October 2024 shows increased area of calcification up to 1.2 cm. Core biopsy without atypia or malignancy. Recommended return to bilateral screening mammography. Had f/u with neurology 01/20/23. Evaluated for recurrent vision changes with paresthesia concerning for migraine aura - in a perimenopausal patient - episode of dizziness + tinnitus on 03/07/2020 - increased aura frequency. Recommended MRI. MRI performed 02/2023 - stable. Unchanged 10mm meningioma. History of CVA. On aspirin . Calcium  score - 0. Saw cardiology 11/12/21. Review of all Linq tracings have not shown any significant arrhythmias. TEE unremarkable. Saw Dr Alvia Awkward - 08/24/23 - plan - ultrasound - sonohysterography. Scheduled to have in 11/2023. Did have menstrual period 07/2023. No chest pain or sob reported. Bowels regular if she takes benefiber daily and occasional miralax. Handlig stress. Doing well on lexapro . Discussed labs.    Past Medical History:  Diagnosis Date   GERD (gastroesophageal reflux disease)    Past Surgical History:  Procedure Laterality Date   BREAST BIOPSY Right 02/18/2023   Right Breast Stereo Bx, Coil clip, path pending   BREAST BIOPSY Right 02/18/2023   MM RT BREAST BX W LOC DEV 1ST LESION IMAGE BX SPEC STEREO GUIDE 02/18/2023 [facility]   BUNIONECTOMY Right 09/25/2016   Procedure: lapidus type  modified mcbride;  Surgeon: Sharlyn Deaner, DPM;  Location: [facility];  Service: Podiatry;  Laterality: Right;   CESAREAN SECTION  2004   LOOP RECORDER INSERTION N/A 02/19/2017   Procedure: LOOP RECORDER INSERTION;  Surgeon: Percival Brace, MD;  Location: [facility] INVASIVE CV LAB;  Service:  Cardiovascular;  Laterality: N/A;   OSTECTOMY Right 09/25/2016   Procedure: OSTECTOMY RT  1SR,2ND 3RD  metatarsal;  Surgeon: Sharlyn Deaner, DPM;  Location: [facility];  Service: Podiatry;  Laterality: Right;  LMA LOCAL   TEE WITHOUT CARDIOVERSION N/A 10/28/2016   Procedure: TRANSESOPHAGEAL ECHOCARDIOGRAM (TEE);  Surgeon: Ronney Cola, MD;  Location: [facility] OR[facility];  Service: Cardiovascular;  Laterality: N/A;   Family History  Problem Relation Age of Onset   Aortic aneurysm Father        also has popliteal aneurysm   Hypertension Father    Hypercholesterolemia Father    Diabetes Father    Aortic aneurysm Mother    Hypercholesterolemia Mother    Hypercholesterolemia Sister        x2   Hyperlipidemia Brother    Hypertension Sister    Breast cancer Maternal Aunt    Melanoma Maternal Uncle    Colon cancer Neg Hx    Social History   Socioeconomic History   Marital status: Married    Spouse name: Not on file   Number of children: 1   Years of education: Not on file   Highest education level: Not on file  Occupational History   Occupation: transcriptionist  Tobacco Use   Smoking status: Never   Smokeless tobacco: Never  Vaping Use   Vaping status: Never Used  Substance and Sexual Activity   Alcohol use: No    Alcohol/week: 0.0 standard drinks of alcohol   Drug use: No   Sexual activity: Yes    Birth control/protection: None  Other Topics Concern   Not  on file  Social History Narrative   Not on file   Social Drivers of Health   Financial Resource Strain: Low Risk  (08/24/2023)   Received from [facility]   Overall Financial Resource Strain (CARDIA)    Difficulty of Paying Living Expenses: Not hard at all  Food Insecurity: No Food Insecurity (08/24/2023)   Received from [facility]   Hunger Vital Sign    Worried About Running Out of Food in the Last Year: Never true    Ran Out of Food in the Last Year: Never true   Transportation Needs: No Transportation Needs (08/24/2023)   Received from [facility] - Transportation    In the past 12 months, has lack of transportation kept you from medical appointments or from getting medications?: No    Lack of Transportation (Non-Medical): No  Physical Activity: Not on file  Stress: Not on file  Social Connections: Not on file     Review of Systems  Constitutional:  Negative for appetite change and unexpected weight change.  HENT:  Negative for congestion and sinus pressure.   Respiratory:  Negative for cough, chest tightness and shortness of breath.   Cardiovascular:  Negative for chest pain, palpitations and leg swelling.  Gastrointestinal:  Negative for abdominal pain, diarrhea, nausea and vomiting.  Genitourinary:  Negative for difficulty urinating and dysuria.  Musculoskeletal:  Negative for joint swelling and myalgias.  Skin:  Negative for color change and rash.  Neurological:  Negative for dizziness and headaches.  Psychiatric/Behavioral:  Negative for agitation and dysphoric mood.        Objective:     BP 112/72   Pulse 75   Temp 98 F (36.7 C)   Resp 16   Ht 5' 7 (1.702 m)   Wt 241 lb (109.3 kg)   SpO2 98%   BMI 37.75 kg/m  Wt Readings from Last 3 Encounters:  10/15/23 241 lb (109.3 kg)  04/16/23 239 lb (108.4 kg)  12/15/22 236 lb (107 kg)    Physical Exam Vitals reviewed.  Constitutional:      General: She is not in acute distress.    Appearance: Normal appearance.  HENT:     Head: Normocephalic and atraumatic.     Right Ear: External ear normal.     Left Ear: External ear normal.     Mouth/Throat:     Pharynx: No oropharyngeal exudate or posterior oropharyngeal erythema.   Eyes:     General: No scleral icterus.       Right eye: No discharge.        Left eye: No discharge.     Conjunctiva/sclera: Conjunctivae normal.   Neck:     Thyroid : No thyromegaly.   Cardiovascular:     Rate and  Rhythm: Normal rate and regular rhythm.  Pulmonary:     Effort: No respiratory distress.     Breath sounds: Normal breath sounds. No wheezing.  Abdominal:     General: Bowel sounds are normal.     Palpations: Abdomen is soft.     Tenderness: There is no abdominal tenderness.   Musculoskeletal:        General: No swelling or tenderness.     Cervical back: Neck supple. No tenderness.  Lymphadenopathy:     Cervical: No cervical adenopathy.   Skin:    Findings: No erythema or rash.   Neurological:     Mental Status: She is alert.  Psychiatric:        Mood and Affect: Mood normal.        Behavior: Behavior normal.         Outpatient Encounter Medications as of 10/15/2023  Medication Sig   Vitamin D , Ergocalciferol , (DRISDOL) 1.25 MG (50000 UNIT) CAPS capsule Take 1 capsule (50,000 Units total) by mouth every 7 (seven) days.   aspirin  81 MG EC tablet TAKE 1 TABLET BY MOUTH DAILY   cholecalciferol (VITAMIN D3) 25 MCG (1000 UNIT) tablet Take 1,000 Units by mouth daily.   EPINEPHrine  (EPIPEN  2-PAK) 0.3 mg/0.3 mL IJ SOAJ injection Inject 0.3 mg into the muscle as needed for anaphylaxis.   escitalopram  (LEXAPRO ) 5 MG tablet Take 1 tablet (5 mg total) by mouth daily.   magnesium oxide (MAG-OX) 400 MG tablet Take 400 mg by mouth daily.   omeprazole  (PRILOSEC) 20 MG capsule Take 1 capsule (20 mg total) by mouth daily.   vitamin B-12 (CYANOCOBALAMIN) 1000 MCG tablet Take 1,000 mcg by mouth daily.   [DISCONTINUED] escitalopram  (LEXAPRO ) 5 MG tablet Take 1 tablet (5 mg total) by mouth daily.   [DISCONTINUED] omeprazole  (PRILOSEC) 20 MG capsule Take 1 capsule (20 mg total) by mouth daily.   No facility-administered encounter medications on file as of 10/15/2023.     Lab Results  Component Value Date   WBC 6.9 10/13/2023   HGB 13.0 10/13/2023   HCT 41.0 10/13/2023   PLT 298 10/13/2023   GLUCOSE 86 10/13/2023   CHOL 160 10/13/2023   TRIG 103 10/13/2023   HDL 52 10/13/2023   LDLCALC  89 10/13/2023   ALT 32 10/13/2023   AST 26 10/13/2023   NA 139 10/13/2023   K 4.6 10/13/2023   CL 102 10/13/2023   CREATININE 0.93 10/13/2023   BUN 21 10/13/2023   CO2 21 10/13/2023   TSH 1.620 10/13/2023   INR 1.04 10/16/2016   HGBA1C 5.3 04/10/2021    MM RT BREAST BX W LOC DEV 1ST LESION IMAGE BX SPEC STEREO GUIDE Addendum Date: 02/19/2023 ADDENDUM REPORT: 02/19/2023 14:30 ADDENDUM: PATHOLOGY revealed: 1. Breast, right, needle core biopsy, lower outer middle depth, coil clip : - BENIGN BREAST PARENCHYMA WITH FIBROCYSTIC CHANGES INCLUDING STROMAL FIBROSIS, COLUMNAR CELL CHANGE AND ASSOCIATED MICROCALCIFICATIONS - FOCAL ADENOSIS - NEGATIVE FOR MALIGNANCY. Pathology results are CONCORDANT with imaging findings, per Dr. Clancy Crimes. Pathology results and recommendations were discussed with patient via telephone on 02/19/2023. Patient reported biopsy site doing well with no adverse symptoms, and only slight tenderness at the site. Post biopsy care instructions were reviewed, questions were answered and my direct phone number was provided. Patient was instructed to call [facility] for any additional questions or concerns related to biopsy site. RECOMMENDATION: Patient instructed to resume annual bilateral screening mammogram due October 2025. Pathology results reported by Ladonna Pickup RN on 02/19/2023. Electronically Signed   By: Clancy Crimes M.D.   On: 02/19/2023 14:30   Result Date: 02/19/2023 CLINICAL DATA:  Indeterminate RIGHT breast calcifications EXAM: RIGHT BREAST STEREOTACTIC CORE NEEDLE BIOPSY COMPARISON:  Previous exam(s). FINDINGS: The patient and I discussed the procedure of stereotactic-guided biopsy including benefits and alternatives. We discussed the high likelihood of a successful procedure. We discussed the risks of the procedure including infection, bleeding, tissue injury, clip migration, and inadequate sampling. Informed written consent was given. The usual  time out protocol was performed immediately prior to the procedure. Using sterile technique and 1% lidocaine  and 1% lidocaine  with epinephrine  as local anesthetic, under stereotactic  guidance, a 9 gauge vacuum assisted device was used to perform core needle biopsy of calcifications in the lower outer quadrant of the RIGHT breast using a lateral approach. Specimen radiograph was performed showing representative calcifications. Specimens with calcifications are identified for pathology. Lesion quadrant: Lower outer quadrant At the conclusion of the procedure, a COIL shaped tissue marker clip was deployed into the biopsy cavity. Follow-up 2-view mammogram was performed and dictated separately. IMPRESSION: Stereotactic-guided biopsy of indeterminate calcifications. No apparent complications. Electronically Signed: By: Clancy Crimes M.D. On: 02/18/2023 09:27   MM CLIP PLACEMENT RIGHT Result Date: 02/18/2023 CLINICAL DATA:  Status post stereotactic guided biopsy EXAM: 3D DIAGNOSTIC RIGHT MAMMOGRAM POST STEREOTACTIC BIOPSY COMPARISON:  Previous exam(s). FINDINGS: 3D Mammographic images were obtained following stereotactic guided biopsy of indeterminate calcifications. The COIL biopsy marking clip is in expected position at the site of biopsy. IMPRESSION: Appropriate positioning of the COIL shaped biopsy marking clip at the site of biopsy in the lower outer breast. Final Assessment: Post Procedure Mammograms for Marker Placement Electronically Signed   By: Clancy Crimes M.D.   On: 02/18/2023 09:27       Assessment & Plan:  Visit for screening mammogram -     3D Screening Mammogram, Left and Right; Future  Anemia, unspecified type Assessment & Plan: Reported history of. Hgb on recent check wnl.   Orders: -     Basic metabolic panel with GFR; Future -     Hepatic function panel; Future  Screening cholesterol level -     Lipid panel; Future  Anxiety Assessment & Plan: Doing well on lexapro .  No changes in medication.    Factor V Leiden mutation [facility]) Assessment & Plan: Evaluated by hematology.  Hetrozygous.     Gastroesophageal reflux disease without esophagitis Assessment & Plan: If she takes prilosec daily, no symptoms. If skips several days, will notice acid reflux. Continue omeprazole  daily. Avoid eating late. Follow.    History of CVA (cerebrovascular accident) Assessment & Plan: No acute abnormality noted on MRI. Continue daily aspirin .  Calcium  score 0.  Desires not to take crestor .    Vitamin D  deficiency Assessment & Plan: Recent vitamin D  21. Start prescription vitamin D  weekly x 12 weeks, and then continue vitamin D3 1000 units q day. Follow.    Other orders -     Escitalopram  Oxalate; Take 1 tablet (5 mg total) by mouth daily.  Dispense: 90 tablet; Refill: 1 -     Omeprazole ; Take 1 capsule (20 mg total) by mouth daily.  Dispense: 90 capsule; Refill: 3 -     Vitamin D  (Ergocalciferol ); Take 1 capsule (50,000 Units total) by mouth every 7 (seven) days.  Dispense: 12 capsule; Refill: 0     Dellar Fenton, MD

## 2023-10-15 NOTE — Assessment & Plan Note (Signed)
 Doing well on lexapro . No changes in medication.

## 2023-10-15 NOTE — Assessment & Plan Note (Signed)
 Recent vitamin D  21. Start prescription vitamin D  weekly x 12 weeks, and then continue vitamin D3 1000 units q day. Follow.

## 2023-11-27 ENCOUNTER — Encounter: Payer: Self-pay | Admitting: Internal Medicine

## 2023-11-27 DIAGNOSIS — N950 Postmenopausal bleeding: Secondary | ICD-10-CM | POA: Insufficient documentation

## 2024-01-08 ENCOUNTER — Other Ambulatory Visit: Payer: Self-pay | Admitting: Internal Medicine

## 2024-01-11 NOTE — Telephone Encounter (Signed)
 Refilled: 10/15/2023 Last OV: 10/15/2023 Next OV: 04/18/2024

## 2024-01-12 NOTE — Telephone Encounter (Signed)
 Per review of chart, she was instructed to take the prescription vitamin D  for 12 weeks and then start vitamin D3 1000 units per day. This does not need to be refilled. Please call her and inform her to take vitamin D3 1000 units per day.

## 2024-02-04 ENCOUNTER — Encounter: Payer: Self-pay | Admitting: Internal Medicine

## 2024-02-04 NOTE — Telephone Encounter (Signed)
 Called and spoke to [place]. Ok with study through oncology.

## 2024-02-05 ENCOUNTER — Other Ambulatory Visit: Payer: Self-pay | Admitting: Medical Genetics

## 2024-02-08 ENCOUNTER — Other Ambulatory Visit
Admission: RE | Admit: 2024-02-08 | Discharge: 2024-02-08 | Disposition: A | Discharge status: Home / Self Care | Payer: Self-pay | Source: Ambulatory Visit | Attending: Medical Genetics | Admitting: Medical Genetics

## 2024-02-11 ENCOUNTER — Ambulatory Visit
Admission: RE | Admit: 2024-02-11 | Discharge: 2024-02-11 | Disposition: A | Discharge status: Home / Self Care | Source: Ambulatory Visit | Attending: Internal Medicine | Admitting: Internal Medicine

## 2024-02-11 DIAGNOSIS — Z1231 Encounter for screening mammogram for malignant neoplasm of breast: Secondary | ICD-10-CM | POA: Diagnosis present

## 2024-02-19 LAB — GENECONNECT MOLECULAR SCREEN: Genetic Analysis Overall Interpretation: NEGATIVE

## 2024-03-24 ENCOUNTER — Telehealth: Admitting: Physician Assistant

## 2024-03-24 DIAGNOSIS — B001 Herpesviral vesicular dermatitis: Secondary | ICD-10-CM | POA: Diagnosis not present

## 2024-03-24 MED ORDER — VALACYCLOVIR HCL 1 G PO TABS
2000.0000 mg | ORAL_TABLET | Freq: Two times a day (BID) | ORAL | 0 refills | Status: AC
Start: 2024-03-24 — End: 2024-03-25

## 2024-03-24 NOTE — Progress Notes (Signed)
 We are sorry that you are not feeling well.  Here is how we plan to help!  Based on what you have shared with me it does look like you have a viral infection.    Most cold sores or fever blisters are small fluid filled blisters around the mouth caused by herpes simplex virus.  The most common strain of the virus causing cold sores is herpes simplex virus 1.  It can be spread by skin contact, sharing eating utensils, or even sharing towels.  Cold sores are contagious to other people until dry. (Approximately 5-7 days).  Wash your hands. You can spread the virus to your eyes through handling your contact lenses after touching the lesions.  Most people experience pain at the sight or tingling sensations in their lips that may begin before the ulcers erupt.  Herpes simplex is treatable but not curable.  It may lie dormant for a long time and then reappear due to stress or prolonged sun exposure.  Many patients have success in treating their cold sores with an over the counter topical called Abreva.  You may apply the cream up to 5 times daily (maximum 10 days) until healing occurs.  If you would like to use an oral antiviral medication to speed the healing of your cold sore, I have sent a prescription to your local pharmacy Valacyclovir  2 gm take one by mouth twice a day for 1 day    HOME CARE:  Wash your hands frequently. Do not pick at or rub the sore. Don't open the blisters. Avoid kissing other people during this time. Avoid sharing drinking glasses, eating utensils, or razors. Do not handle contact lenses unless you have thoroughly washed your hands with soap and warm water ! Avoid oral sex during this time.  Herpes from sores on your mouth can spread to your partner's genital area. Avoid contact with anyone who has eczema or a weakened immune system. Cold sores are often triggered by exposure to intense sunlight, use a lip balm containing a sunscreen (SPF 30 or higher).  GET HELP RIGHT AWAY  IF:  Blisters look infected. Blisters occur near or in the eye. Symptoms last longer than 10 days. Your symptoms become worse.  MAKE SURE YOU:  Understand these instructions. Will watch your condition. Will get help right away if you are not doing well or get worse.    Your e-visit answers were reviewed by a board certified advanced clinical practitioner to complete your personal care plan.  Depending upon the condition, your plan could have  Included both over the counter or prescription medications.    Please review your pharmacy choice.  Be sure that the pharmacy you have chosen is open so that you can pick up your prescription now.  If there is a problem you can message your provider in MyChart to have the prescription routed to another pharmacy.    Your safety is important to us .  If you have drug allergies check our prescription carefully.  For the next 24 hours you can use MyChart to ask questions about today's visit, request a non-urgent call back, or ask for a work or school excuse from your e-visit provider.  You will get an email in the next two days asking about your experience.  I hope that your e-visit has been valuable and will speed your recovery.   I have spent 5 minutes in review of e-visit questionnaire, review and updating patient chart, medical decision making and response to patient.  Elsie Velma Lunger, PA-C

## 2024-04-15 ENCOUNTER — Other Ambulatory Visit

## 2024-04-15 DIAGNOSIS — D649 Anemia, unspecified: Secondary | ICD-10-CM | POA: Diagnosis not present

## 2024-04-15 DIAGNOSIS — Z13220 Encounter for screening for lipoid disorders: Secondary | ICD-10-CM

## 2024-04-15 LAB — LIPID PANEL
Cholesterol: 149 mg/dL (ref 0–200)
HDL: 51.7 mg/dL (ref >39.00)
LDL Cholesterol: 82 mg/dL (ref 0–99)
NonHDL: 97.53
Total CHOL/HDL Ratio: 3
Triglycerides: 79 mg/dL (ref 0.0–149.0)
VLDL: 15.8 mg/dL (ref 0.0–40.0)

## 2024-04-15 LAB — HEPATIC FUNCTION PANEL
ALT: 18 U/L (ref 0–35)
AST: 20 U/L (ref 0–37)
Albumin: 4 g/dL (ref 3.5–5.2)
Alkaline Phosphatase: 71 U/L (ref 39–117)
Bilirubin, Direct: 0.1 mg/dL (ref 0.0–0.3)
Total Bilirubin: 0.5 mg/dL (ref 0.2–1.2)
Total Protein: 6.6 g/dL (ref 6.0–8.3)

## 2024-04-15 LAB — BASIC METABOLIC PANEL WITH GFR
BUN: 17 mg/dL (ref 6–23)
CO2: 28 meq/L (ref 19–32)
Calcium: 9.4 mg/dL (ref 8.4–10.5)
Chloride: 103 meq/L (ref 96–112)
Creatinine, Ser: 0.87 mg/dL (ref 0.40–1.20)
GFR: 76.49 mL/min (ref >60.00)
Glucose, Bld: 100 mg/dL — ABNORMAL HIGH (ref 70–99)
Potassium: 4.8 meq/L (ref 3.5–5.1)
Sodium: 139 meq/L (ref 135–145)

## 2024-04-18 ENCOUNTER — Ambulatory Visit: Payer: Self-pay | Admitting: Internal Medicine

## 2024-04-18 ENCOUNTER — Ambulatory Visit: Admitting: Internal Medicine

## 2024-04-18 ENCOUNTER — Other Ambulatory Visit (HOSPITAL_COMMUNITY)
Admission: RE | Admit: 2024-04-18 | Discharge: 2024-04-18 | Disposition: A | Discharge status: Home / Self Care | Source: Ambulatory Visit | Attending: Internal Medicine | Admitting: Internal Medicine

## 2024-04-18 ENCOUNTER — Encounter: Payer: Self-pay | Admitting: Internal Medicine

## 2024-04-18 VITALS — BP 122/76 | HR 73 | Temp 98.0°F | Ht 67.0 in | Wt 233.4 lb

## 2024-04-18 DIAGNOSIS — Z124 Encounter for screening for malignant neoplasm of cervix: Secondary | ICD-10-CM | POA: Diagnosis not present

## 2024-04-18 DIAGNOSIS — K219 Gastro-esophageal reflux disease without esophagitis: Secondary | ICD-10-CM | POA: Diagnosis not present

## 2024-04-18 DIAGNOSIS — F419 Anxiety disorder, unspecified: Secondary | ICD-10-CM | POA: Diagnosis not present

## 2024-04-18 DIAGNOSIS — D649 Anemia, unspecified: Secondary | ICD-10-CM

## 2024-04-18 DIAGNOSIS — G43109 Migraine with aura, not intractable, without status migrainosus: Secondary | ICD-10-CM | POA: Diagnosis not present

## 2024-04-18 DIAGNOSIS — N950 Postmenopausal bleeding: Secondary | ICD-10-CM

## 2024-04-18 DIAGNOSIS — Z13220 Encounter for screening for lipoid disorders: Secondary | ICD-10-CM

## 2024-04-18 DIAGNOSIS — D6851 Activated protein C resistance: Secondary | ICD-10-CM | POA: Diagnosis not present

## 2024-04-18 DIAGNOSIS — Z0000 Encounter for general adult medical examination without abnormal findings: Secondary | ICD-10-CM

## 2024-04-18 DIAGNOSIS — Z8673 Personal history of transient ischemic attack (TIA), and cerebral infarction without residual deficits: Secondary | ICD-10-CM | POA: Diagnosis not present

## 2024-04-18 MED ORDER — ESCITALOPRAM OXALATE 5 MG PO TABS: 5.0000 mg | ORAL_TABLET | Freq: Every day | ORAL | 1 refills | Status: AC

## 2024-04-18 NOTE — Assessment & Plan Note (Addendum)
 Physical today 04/18/24.  PAP 04/09/21 - negative with negative HPV.   Seeing Dr Verdon now. Last diagnostic mammogram on October 2024 shows increased area of calcification up to 1.2 cm. Core biopsy without atypia or malignancy. Patient can return to bilateral screening mammography. Mammogram 02/12/24 - Birads I. Colonoscopy 05/17/20 - recommended f/u in 10 years.

## 2024-04-18 NOTE — Progress Notes (Unsigned)
 Subjective:    Patient ID: Gwendolyn Torres, female    DOB: 08/12/1970, 53 y.o.   MRN: [id number]  Patient here for  Chief Complaint  Patient presents with   Annual Exam    HPI Here for a physical exam. Seen 03/24/24 - fever blister. Treated with valtrex . Saw Dr Verdon 11/26/23 - post menopausal bleeding - s/p endometrial biopsy- atrophic endometrium. No hyperplasia or carcinoma. No bleeding since    Past Medical History:  Diagnosis Date   GERD (gastroesophageal reflux disease)    Past Surgical History:  Procedure Laterality Date   BREAST BIOPSY Right 02/18/2023   MM RT BREAST BX W LOC DEV 1ST LESION IMAGE BX SPEC STEREO GUIDE 02/18/2023 [facility]   BUNIONECTOMY Right 09/25/2016   Procedure: lapidus type  modified mcbride;  Surgeon: Lilli Cough, DPM;  Location: [facility];  Service: Podiatry;  Laterality: Right;   CESAREAN SECTION  2004   LOOP RECORDER INSERTION N/A 02/19/2017   Procedure: LOOP RECORDER INSERTION;  Surgeon: Ammon Blunt, MD;  Location: [facility];  Service: Cardiovascular;  Laterality: N/A;   OSTECTOMY Right 09/25/2016   Procedure: OSTECTOMY RT  1SR,2ND 3RD  metatarsal;  Surgeon: Lilli Cough, DPM;  Location: [facility];  Service: Podiatry;  Laterality: Right;  LMA LOCAL   TEE WITHOUT CARDIOVERSION N/A 10/28/2016   Procedure: TRANSESOPHAGEAL ECHOCARDIOGRAM (TEE);  Surgeon: Bosie Vinie LABOR, MD;  Location: [facility];  Service: Cardiovascular;  Laterality: N/A;   Family History  Problem Relation Age of Onset   Aortic aneurysm Father        also has popliteal aneurysm   Hypertension Father    Hypercholesterolemia Father    Diabetes Father    Aortic aneurysm Mother    Hypercholesterolemia Mother    Hypercholesterolemia Sister        x2   Hyperlipidemia Brother    Hypertension Sister    Breast cancer Maternal Aunt    Melanoma Maternal Uncle    Colon cancer Neg Hx    Social History   Socioeconomic History    Marital status: Married    Spouse name: Not on file   Number of children: 1   Years of education: Not on file   Highest education level: Not on file  Occupational History   Occupation: transcriptionist  Tobacco Use   Smoking status: Never   Smokeless tobacco: Never  Vaping Use   Vaping status: Never Used  Substance and Sexual Activity   Alcohol use: No    Alcohol/week: 0.0 standard drinks of alcohol   Drug use: No   Sexual activity: Yes    Birth control/protection: None  Other Topics Concern   Not on file  Social History Narrative   Not on file   Social Drivers of Health   Tobacco Use: Low Risk (04/18/2024)   Patient History    Smoking Tobacco Use: Never    Smokeless Tobacco Use: Never    Passive Exposure: Not on file  Financial Resource Strain: Low Risk  (08/24/2023)   Received from [facility]   Overall Financial Resource Strain (CARDIA)    Difficulty of Paying Living Expenses: Not hard at all  Food Insecurity: No Food Insecurity (08/24/2023)   Received from [facility]   Epic    Within the past 12 months, you worried that your food would run out before you got the money to buy more.: Never true    Within the past 12 months, the food  you bought just didn't last and you didn't have money to get more.: Never true  Transportation Needs: No Transportation Needs (08/24/2023)   Received from [facility] - Transportation    In the past 12 months, has lack of transportation kept you from medical appointments or from getting medications?: No    Lack of Transportation (Non-Medical): No  Physical Activity: Not on file  Stress: Not on file  Social Connections: Not on file  Depression (PHQ2-9): Low Risk (04/18/2024)   Depression (PHQ2-9)    PHQ-2 Score: 4  Alcohol Screen: Not on file  Housing: Low Risk  (08/24/2023)   Received from [facility]   Epic    In the last 12 months, was there a time when  you were not able to pay the mortgage or rent on time?: No    In the past 12 months, how many times have you moved where you were living?: 0    At any time in the past 12 months, were you homeless or living in a shelter (including now)?: No  Utilities: Not At Risk (08/24/2023)   Received from [facility]ities    Threatened with loss of utilities: No  Health Literacy: Not on file     Review of Systems     Objective:     BP 122/76   Pulse 73   Temp 98 F (36.7 C) (Oral)   Ht 5' 7 (1.702 m)   Wt 233 lb 6.4 oz (105.9 kg)   SpO2 99%   BMI 36.56 kg/m  Wt Readings from Last 3 Encounters:  04/18/24 233 lb 6.4 oz (105.9 kg)  10/15/23 241 lb (109.3 kg)  04/16/23 239 lb (108.4 kg)    Physical Exam  {Perform Simple Foot Exam  Perform Detailed exam:1} {Insert foot Exam (Optional):30965}   Outpatient Encounter Medications as of 04/18/2024  Medication Sig   aspirin  81 MG EC tablet TAKE 1 TABLET BY MOUTH DAILY   cholecalciferol (VITAMIN D3) 25 MCG (1000 UNIT) tablet Take 1,000 Units by mouth daily.   EPINEPHrine  (EPIPEN  2-PAK) 0.3 mg/0.3 mL IJ SOAJ injection Inject 0.3 mg into the muscle as needed for anaphylaxis.   magnesium oxide (MAG-OX) 400 MG tablet Take 400 mg by mouth daily.   omeprazole  (PRILOSEC) 20 MG capsule Take 1 capsule (20 mg total) by mouth daily.   vitamin B-12 (CYANOCOBALAMIN) 1000 MCG tablet Take 1,000 mcg by mouth daily.   escitalopram  (LEXAPRO ) 5 MG tablet Take 1 tablet (5 mg total) by mouth daily.   Vitamin D , Ergocalciferol , (DRISDOL ) 1.25 MG (50000 UNIT) CAPS capsule Take 1 capsule (50,000 Units total) by mouth every 7 (seven) days.   [DISCONTINUED] escitalopram  (LEXAPRO ) 5 MG tablet Take 1 tablet (5 mg total) by mouth daily.   No facility-administered encounter medications on file as of 04/18/2024.     Lab Results  Component Value Date   WBC 6.9 10/13/2023   HGB 13.0 10/13/2023   HCT 41.0 10/13/2023   PLT 298 10/13/2023    GLUCOSE 100 (H) 04/15/2024   CHOL 149 04/15/2024   TRIG 79.0 04/15/2024   HDL 51.70 04/15/2024   LDLCALC 82 04/15/2024   ALT 18 04/15/2024   AST 20 04/15/2024   NA 139 04/15/2024   K 4.8 04/15/2024   CL 103 04/15/2024   CREATININE 0.87 04/15/2024   BUN 17 04/15/2024   CO2 28 04/15/2024   TSH 1.620 10/13/2023   INR  1.04 10/16/2016   HGBA1C 5.3 04/10/2021    MM 3D SCREENING MAMMOGRAM BILATERAL BREAST Result Date: 02/12/2024 CLINICAL DATA:  Screening. EXAM: DIGITAL SCREENING BILATERAL MAMMOGRAM WITH TOMOSYNTHESIS AND CAD TECHNIQUE: Bilateral screening digital craniocaudal and mediolateral oblique mammograms were obtained. Bilateral screening digital breast tomosynthesis was performed. The images were evaluated with computer-aided detection. COMPARISON:  Previous exam(s). ACR Breast Density Category b: There are scattered areas of fibroglandular density. FINDINGS: There are no findings suspicious for malignancy. IMPRESSION: No mammographic evidence of malignancy. A result letter of this screening mammogram will be mailed directly to the patient. RECOMMENDATION: Screening mammogram in one year. (Code:SM-B-01Y) BI-RADS CATEGORY  1: Negative. Electronically Signed   By: Dina  Arceo M.D.   On: 02/12/2024 15:12       Assessment & Plan:  Health care maintenance Assessment & Plan: Physical today 04/18/24.  PAP 04/09/21 - negative with negative HPV.   Seeing Dr Verdon now. Last diagnostic mammogram on October 2024 shows increased area of calcification up to 1.2 cm. Core biopsy without atypia or malignancy. Patient can return to bilateral screening mammography. Mammogram 02/12/24 - Birads I. Colonoscopy 05/17/20 - recommended f/u in 10 years.      Screening cholesterol level -     Lipid panel; Future  Anemia, unspecified type -     Hepatic function panel; Future -     Basic metabolic panel with GFR; Future -     CBC with Differential/Platelet; Future -     TSH; Future  Screening for  cervical cancer -     Cytology - PAP  Other orders -     Escitalopram  Oxalate; Take 1 tablet (5 mg total) by mouth daily.  Dispense: 90 tablet; Refill: 1     Allena Hamilton, MD

## 2024-04-22 LAB — CYTOLOGY - PAP
Comment: NEGATIVE
Diagnosis: NEGATIVE
Diagnosis: REACTIVE
High risk HPV: NEGATIVE

## 2024-04-23 ENCOUNTER — Ambulatory Visit: Payer: Self-pay | Admitting: Internal Medicine

## 2024-04-24 ENCOUNTER — Encounter: Payer: Self-pay | Admitting: Internal Medicine

## 2024-04-24 MED ORDER — ROSUVASTATIN CALCIUM 5 MG PO TABS: 5.0000 mg | ORAL_TABLET | Freq: Every day | ORAL | 0 refills | Status: AC

## 2024-04-24 NOTE — Assessment & Plan Note (Signed)
"   Had f/u with neurology 01/20/23. Evaluated for recurrent vision changes with paresthesia concerning for migraine aura - in a perimenopausal patient - episode of dizziness + tinnitus on 03/07/2020 - increased aura frequency. Recommended MRI. MRI performed 02/2023 - stable. Unchanged 10mm meningioma. Overall doing better. Follow.  "

## 2024-04-24 NOTE — Assessment & Plan Note (Signed)
 Saw Dr Verdon 11/26/23 - post menopausal bleeding - s/p endometrial biopsy- atrophic endometrium. No hyperplasia or carcinoma. No bleeding since March.

## 2024-04-24 NOTE — Assessment & Plan Note (Signed)
Evaluated by hematology.  Hetrozygous.   

## 2024-04-24 NOTE — Assessment & Plan Note (Signed)
 Appears to be doing well on lexapro . No change in medication. Follow.

## 2024-04-24 NOTE — Assessment & Plan Note (Signed)
 No acute abnormality noted on MRI. Continue daily aspirin .  Calcium  score 0.  Discussed restarting crestor .

## 2024-04-24 NOTE — Assessment & Plan Note (Signed)
 Continue prilosec. No upper symptoms reported.

## 2024-05-09 ENCOUNTER — Encounter: Payer: Self-pay | Admitting: Internal Medicine

## 2024-05-11 MED ORDER — VALACYCLOVIR HCL 1 G PO TABS: ORAL_TABLET | ORAL | 0 refills | Status: AC

## 2024-05-11 NOTE — Telephone Encounter (Signed)
 Rx sent in for valtrex  to have if needed. Pt notified via my chart.

## 2024-10-18 ENCOUNTER — Other Ambulatory Visit

## 2024-10-20 ENCOUNTER — Ambulatory Visit: Admitting: Internal Medicine
